# Patient Record
Sex: Male | Born: 1965 | Race: White | Hispanic: No | Smoking: Never smoker
Health system: Southern US, Community
[De-identification: ages and names within clinical notes are randomized; demographics above are authoritative.]

## PROBLEM LIST (undated history)

## (undated) DIAGNOSIS — K219 Gastro-esophageal reflux disease without esophagitis: Secondary | ICD-10-CM

## (undated) DIAGNOSIS — I340 Nonrheumatic mitral (valve) insufficiency: Secondary | ICD-10-CM

## (undated) DIAGNOSIS — M1990 Unspecified osteoarthritis, unspecified site: Secondary | ICD-10-CM

## (undated) DIAGNOSIS — I351 Nonrheumatic aortic (valve) insufficiency: Secondary | ICD-10-CM

## (undated) DIAGNOSIS — F419 Anxiety disorder, unspecified: Secondary | ICD-10-CM

## (undated) DIAGNOSIS — R454 Irritability and anger: Secondary | ICD-10-CM

## (undated) DIAGNOSIS — G709 Myoneural disorder, unspecified: Secondary | ICD-10-CM

## (undated) DIAGNOSIS — F1910 Other psychoactive substance abuse, uncomplicated: Secondary | ICD-10-CM

## (undated) DIAGNOSIS — R011 Cardiac murmur, unspecified: Secondary | ICD-10-CM

## (undated) DIAGNOSIS — I2510 Atherosclerotic heart disease of native coronary artery without angina pectoris: Secondary | ICD-10-CM

## (undated) HISTORY — PX: FINGER SURGERY: SHX640

## (undated) HISTORY — DX: Nonrheumatic aortic (valve) insufficiency: I35.1

## (undated) HISTORY — DX: Nonrheumatic mitral (valve) insufficiency: I34.0

## (undated) HISTORY — PX: KNEE SURGERY: SHX244

## (undated) HISTORY — DX: Gastro-esophageal reflux disease without esophagitis: K21.9

## (undated) HISTORY — PX: JOINT REPLACEMENT: SHX530

## (undated) HISTORY — DX: Myoneural disorder, unspecified: G70.9

## (undated) HISTORY — DX: Other psychoactive substance abuse, uncomplicated: F19.10

---

## 1999-09-25 ENCOUNTER — Encounter: Payer: Self-pay | Admitting: General Surgery

## 1999-09-25 ENCOUNTER — Inpatient Hospital Stay (HOSPITAL_COMMUNITY): Admission: EM | Admit: 1999-09-25 | Discharge: 1999-09-28 | Payer: Self-pay

## 1999-09-26 ENCOUNTER — Encounter: Payer: Self-pay | Admitting: General Surgery

## 1999-10-09 ENCOUNTER — Ambulatory Visit (HOSPITAL_COMMUNITY): Admission: RE | Admit: 1999-10-09 | Discharge: 1999-10-09 | Payer: Self-pay

## 2001-05-18 ENCOUNTER — Emergency Department (HOSPITAL_COMMUNITY): Admission: EM | Admit: 2001-05-18 | Discharge: 2001-05-19 | Payer: Self-pay | Admitting: *Deleted

## 2002-10-11 ENCOUNTER — Encounter: Payer: Self-pay | Admitting: Emergency Medicine

## 2002-10-11 ENCOUNTER — Emergency Department (HOSPITAL_COMMUNITY): Admission: EM | Admit: 2002-10-11 | Discharge: 2002-10-11 | Payer: Self-pay | Admitting: Emergency Medicine

## 2007-03-12 ENCOUNTER — Emergency Department (HOSPITAL_COMMUNITY): Admission: EM | Admit: 2007-03-12 | Discharge: 2007-03-12 | Payer: Self-pay | Admitting: Emergency Medicine

## 2009-05-26 ENCOUNTER — Emergency Department (HOSPITAL_COMMUNITY): Admission: EM | Admit: 2009-05-26 | Discharge: 2009-05-26 | Payer: Self-pay | Admitting: Emergency Medicine

## 2009-09-13 ENCOUNTER — Emergency Department (HOSPITAL_COMMUNITY): Admission: EM | Admit: 2009-09-13 | Discharge: 2009-09-13 | Payer: Self-pay | Admitting: Emergency Medicine

## 2009-09-14 ENCOUNTER — Ambulatory Visit (HOSPITAL_COMMUNITY): Admission: RE | Admit: 2009-09-14 | Discharge: 2009-09-14 | Payer: Self-pay | Admitting: Emergency Medicine

## 2009-11-27 ENCOUNTER — Ambulatory Visit (HOSPITAL_COMMUNITY): Admission: RE | Admit: 2009-11-27 | Discharge: 2009-11-27 | Payer: Self-pay | Admitting: Family Medicine

## 2010-04-16 ENCOUNTER — Emergency Department (HOSPITAL_COMMUNITY): Admission: EM | Admit: 2010-04-16 | Discharge: 2010-04-16 | Payer: Self-pay | Admitting: Emergency Medicine

## 2010-12-02 ENCOUNTER — Encounter: Payer: Self-pay | Admitting: Family Medicine

## 2011-02-13 LAB — BASIC METABOLIC PANEL
BUN: 11 mg/dL (ref 6–23)
GFR calc non Af Amer: >60 mL/min (ref >60)
Potassium: 3.8 mEq/L (ref 3.5–5.1)
Sodium: 137 mEq/L (ref 135–145)

## 2011-02-13 LAB — DIFFERENTIAL
Eosinophils Relative: 2 % (ref 0–5)
Lymphocytes Relative: 33 % (ref 12–46)
Lymphs Abs: 2.2 10*3/uL (ref 0.7–4.0)
Neutro Abs: 4 10*3/uL (ref 1.7–7.7)

## 2011-02-13 LAB — CBC
HCT: 42.1 % (ref 39.0–52.0)
Platelets: 207 10*3/uL (ref 150–400)
WBC: 6.9 10*3/uL (ref 4.0–10.5)

## 2011-02-13 LAB — POCT CARDIAC MARKERS
CKMB, poc: <1 ng/mL — ABNORMAL LOW (ref 1.0–8.0)
Myoglobin, poc: 57.3 ng/mL (ref 12–200)
Troponin i, poc: <0.05 ng/mL (ref 0.00–0.09)

## 2012-12-06 ENCOUNTER — Encounter (HOSPITAL_COMMUNITY): Payer: Self-pay | Admitting: Emergency Medicine

## 2012-12-06 ENCOUNTER — Emergency Department (HOSPITAL_COMMUNITY)
Admission: EM | Admit: 2012-12-06 | Discharge: 2012-12-07 | Disposition: A | Discharge status: Home / Self Care | Payer: Medicaid Other | Attending: Emergency Medicine | Admitting: Emergency Medicine

## 2012-12-06 DIAGNOSIS — R109 Unspecified abdominal pain: Secondary | ICD-10-CM

## 2012-12-06 DIAGNOSIS — R21 Rash and other nonspecific skin eruption: Secondary | ICD-10-CM | POA: Insufficient documentation

## 2012-12-06 DIAGNOSIS — R1032 Left lower quadrant pain: Secondary | ICD-10-CM | POA: Insufficient documentation

## 2012-12-06 NOTE — ED Notes (Signed)
Patient complaining of sharp pain to LLQ. Started approximately 3 hours ago. Denies nausea and diarrhea, no decrease in appetite.

## 2012-12-07 LAB — URINALYSIS, ROUTINE W REFLEX MICROSCOPIC
Ketones, ur: NEGATIVE mg/dL
Leukocytes, UA: NEGATIVE
Nitrite: NEGATIVE
Protein, ur: NEGATIVE mg/dL
Urobilinogen, UA: 0.2 mg/dL (ref 0.0–1.0)
pH: 6.5 (ref 5.0–8.0)

## 2012-12-07 MED ORDER — PERMETHRIN 5 % EX CREA: TOPICAL_CREAM | CUTANEOUS | Status: DC

## 2012-12-07 MED ORDER — ONDANSETRON 8 MG PO TBDP
8.0000 mg | ORAL_TABLET | Freq: Once | ORAL | Status: AC
  Administered 2012-12-07: 8 mg via ORAL
  Filled 2012-12-07: qty 1

## 2012-12-07 MED ORDER — ACETAMINOPHEN 325 MG PO TABS
650.0000 mg | ORAL_TABLET | Freq: Once | ORAL | Status: AC
  Administered 2012-12-07: 650 mg via ORAL
  Filled 2012-12-07: qty 2

## 2012-12-07 NOTE — ED Notes (Signed)
Patient reports history of alcoholism. Reports had quit for several years, then started drinking heavily again. Reports has not drank for 3 weeks and has quit again. Also reports that he was taking a family member's diuretic pills (unsure of name) frequently when he was drinking heavily.

## 2012-12-07 NOTE — ED Notes (Signed)
Pt notes sharp sudden onset LLQ pain and left upper leg/hip pain. States the pain started suddenly 3 hours ago. Also notes some nausea. No other complaints at this time.

## 2012-12-07 NOTE — ED Provider Notes (Signed)
History     CSN: [id number]  Arrival date & time 12/06/12  2334   First MD Initiated Contact with Patient 12/06/12 2355      Chief Complaint  Patient presents with  . Abdominal Pain  . Rash     Patient is a 47 y.o. male presenting with abdominal pain and rash. The history is provided by the patient.  Abdominal Pain The primary symptoms of the illness include abdominal pain. The primary symptoms of the illness do not include fever, shortness of breath, nausea, vomiting, diarrhea, hematochezia or dysuria. The current episode started 3 to 5 hours ago. The onset of the illness was sudden. The problem has not changed since onset. The patient has not had a change in bowel habit. Symptoms associated with the illness do not include diaphoresis, constipation, frequency or back pain.  Rash  This is a recurrent problem. The current episode started more than 1 week ago. The problem has not changed since onset.The problem is associated with an unknown factor.  pt reports he had LLQ pain that started 3 hrs ago.  Never had this before.  No trauma.  No fever/vomiting.  No back pain.  No groin pain.  No weakness reported  He also reports rash to his legs for several weeks.  He reports it is itching and it is worse at night. No sick contacts No h/o allergic reactions  He reports quitting etoh about 3 weeks ago.  No other acute issues at this time  PMH - none  Past Surgical History  Procedure Date  . Finger surgery     History reviewed. No pertinent family history.  History  Substance Use Topics  . Smoking status: Never Smoker   . Smokeless tobacco: Not on file  . Alcohol Use: No     Comment: quit 3 weeks ago per patient, reports used to drink heavily.      Review of Systems  Constitutional: Negative for fever and diaphoresis.  Respiratory: Negative for shortness of breath.   Gastrointestinal: Positive for abdominal pain. Negative for nausea, vomiting, diarrhea, constipation and  hematochezia.  Genitourinary: Negative for dysuria, frequency and testicular pain.  Musculoskeletal: Negative for back pain.  Skin: Positive for rash.  All other systems reviewed and are negative.    Allergies  Review of patient's allergies indicates no known allergies.  Home Medications  No current outpatient prescriptions on file.  BP 132/85  Pulse 90  Temp 97.8 F (36.6 C) (Oral)  Resp 20  Ht 5\' 6"  (1.676 m)  Wt 210 lb (95.255 kg)  BMI 33.89 kg/m2  SpO2 96%  Physical Exam CONSTITUTIONAL: Well developed/well nourished, well appearing, watching TV HEAD AND FACE: Normocephalic/atraumatic EYES: EOMI/PERRL ENMT: Mucous membranes moist NECK: supple no meningeal signs SPINE:entire spine nontender CV: S1/S2 noted, no murmurs/rubs/gallops noted LUNGS: Lungs are clear to auscultation bilaterally, no apparent distress ABDOMEN: soft, mild tenderness to left lower quadrant, no rebound or guarding, +BS GU:no cva tenderness. No inguinal tenderness/hernia noted NEURO: Pt is awake/alert, moves all extremitiesx44m, no focal weakness noted EXTREMITIES: pulses normal, full ROM SKIN: warm, color normal. Scattered Erythematous rash noted to bilateral LE.  No urticaria.  No petechiae.   PSYCH: no abnormalities of mood noted  ED Course  Procedures    Labs Reviewed  URINALYSIS, ROUTINE W REFLEX MICROSCOPIC   Pt reports onset of LLQ pain earlier tonight.  He is well appearing and in no distress.  He is point tender, but no hernia noted.  He has  no other associated symptoms.  Will check u/a and reassess  For his rash, will treat for possible scabies.    1:10 AM Pt reading a magazine, watching TV.  Very well appearing.  I doubt acute abdominal process.  He is in no distress.  He is improved and his abdomen is soft without focal tenderness.  We discussed strict return precautions.  Pt agreeable to plan   MDM  Nursing notes including past medical history and social history reviewed and  considered in documentation Labs/vital reviewed and considered         Joya Gaskins, MD 12/07/12 0111

## 2014-10-30 ENCOUNTER — Encounter (HOSPITAL_COMMUNITY): Payer: Self-pay | Admitting: *Deleted

## 2014-10-30 ENCOUNTER — Emergency Department (HOSPITAL_COMMUNITY): Payer: Medicaid Other

## 2014-10-30 ENCOUNTER — Emergency Department (HOSPITAL_COMMUNITY)
Admission: EM | Admit: 2014-10-30 | Discharge: 2014-10-30 | Disposition: A | Discharge status: Home / Self Care | Payer: Medicaid Other | Attending: Emergency Medicine | Admitting: Emergency Medicine

## 2014-10-30 DIAGNOSIS — M79602 Pain in left arm: Secondary | ICD-10-CM | POA: Insufficient documentation

## 2014-10-30 DIAGNOSIS — R079 Chest pain, unspecified: Secondary | ICD-10-CM | POA: Insufficient documentation

## 2014-10-30 DIAGNOSIS — Z79899 Other long term (current) drug therapy: Secondary | ICD-10-CM | POA: Insufficient documentation

## 2014-10-30 DIAGNOSIS — M62838 Other muscle spasm: Secondary | ICD-10-CM | POA: Insufficient documentation

## 2014-10-30 DIAGNOSIS — M542 Cervicalgia: Secondary | ICD-10-CM | POA: Insufficient documentation

## 2014-10-30 LAB — CBC WITH DIFFERENTIAL/PLATELET
Basophils Absolute: 0 10*3/uL (ref 0.0–0.1)
Basophils Relative: 1 % (ref 0–1)
EOS PCT: 3 % (ref 0–5)
Eosinophils Absolute: 0.1 10*3/uL (ref 0.0–0.7)
HEMATOCRIT: 39.3 % (ref 39.0–52.0)
HEMOGLOBIN: 13.6 g/dL (ref 13.0–17.0)
LYMPHS ABS: 2 10*3/uL (ref 0.7–4.0)
LYMPHS PCT: 36 % (ref 12–46)
MCH: 30.2 pg (ref 26.0–34.0)
MCHC: 34.6 g/dL (ref 30.0–36.0)
MCV: 87.3 fL (ref 78.0–100.0)
MONO ABS: 0.5 10*3/uL (ref 0.1–1.0)
MONOS PCT: 9 % (ref 3–12)
Neutro Abs: 2.9 10*3/uL (ref 1.7–7.7)
Neutrophils Relative %: 53 % (ref 43–77)
Platelets: 223 10*3/uL (ref 150–400)
RBC: 4.5 MIL/uL (ref 4.22–5.81)
RDW: 12.3 % (ref 11.5–15.5)
WBC: 5.5 10*3/uL (ref 4.0–10.5)

## 2014-10-30 LAB — COMPREHENSIVE METABOLIC PANEL
ALT: 19 U/L (ref 0–53)
ANION GAP: 14 (ref 5–15)
AST: 17 U/L (ref 0–37)
Albumin: 3.6 g/dL (ref 3.5–5.2)
Alkaline Phosphatase: 68 U/L (ref 39–117)
BUN: 11 mg/dL (ref 6–23)
CALCIUM: 8.9 mg/dL (ref 8.4–10.5)
CO2: 22 meq/L (ref 19–32)
CREATININE: 0.74 mg/dL (ref 0.50–1.35)
Chloride: 103 mEq/L (ref 96–112)
GLUCOSE: 109 mg/dL — AB (ref 70–99)
Potassium: 3.7 mEq/L (ref 3.7–5.3)
SODIUM: 139 meq/L (ref 137–147)
Total Bilirubin: 0.2 mg/dL — ABNORMAL LOW (ref 0.3–1.2)
Total Protein: 6.6 g/dL (ref 6.0–8.3)

## 2014-10-30 LAB — TROPONIN I: Troponin I: <0.3 ng/mL (ref <0.30)

## 2014-10-30 MED ORDER — IBUPROFEN 800 MG PO TABS
800.0000 mg | ORAL_TABLET | Freq: Once | ORAL | Status: AC
  Administered 2014-10-30: 800 mg via ORAL
  Filled 2014-10-30: qty 1

## 2014-10-30 MED ORDER — IBUPROFEN 800 MG PO TABS: 800.0000 mg | ORAL_TABLET | Freq: Three times a day (TID) | ORAL | Status: DC | PRN

## 2014-10-30 MED ORDER — OXYCODONE-ACETAMINOPHEN 5-325 MG PO TABS: 1.0000 | ORAL_TABLET | ORAL | Status: DC | PRN

## 2014-10-30 MED ORDER — DIAZEPAM 5 MG PO TABS
5.0000 mg | ORAL_TABLET | Freq: Once | ORAL | Status: AC
  Administered 2014-10-30: 5 mg via ORAL
  Filled 2014-10-30: qty 1

## 2014-10-30 MED ORDER — DIAZEPAM 5 MG PO TABS: 5.0000 mg | ORAL_TABLET | Freq: Three times a day (TID) | ORAL | Status: DC | PRN

## 2014-10-30 MED ORDER — OXYCODONE-ACETAMINOPHEN 5-325 MG PO TABS
1.0000 | ORAL_TABLET | Freq: Once | ORAL | Status: AC
  Administered 2014-10-30: 1 via ORAL
  Filled 2014-10-30: qty 1

## 2014-10-30 NOTE — ED Notes (Signed)
Pt verbalized understanding of no driving and to use caution within 4 hours of taking pain meds due to meds cause drowsiness 

## 2014-10-30 NOTE — ED Provider Notes (Signed)
This chart was scribed for Layla MawKristen N Kess Mcilwain, DO by Roxy Cedarhandni Bhalodia, ED Scribe. This patient was seen in room APA11/[facility] and the patient's care was started at 6:20 PM.  TIME SEEN: 6:35 PM   CHIEF COMPLAINT: Arm Pain  HPI: Patient is a 48 y.o. male with no known chronic medical conditions, who presents to the ED today complaining of posterior neck and bilateral shoulder pain. Patient states that he came home from work yesterday at 5pm and had bilateral shoulder pain, works as a Chartered certified accountantmachinist. He also reports associated swelling of hands and chest pain. Chest pain is intermittent and lasts for 1-2 seconds. He describes his chest pain as a dull pain with intermittent sharp spasms. He denies associated SOB, nausea, vomiting, diaphoresis, dizziness. He states that the pain worsens with movement and improves with ibuprofen and aleve. Patient is a non smoker. He has a family history of hypertension, diabetes, HLD. No h/o PE or DVT.  No fever, cough.  No numbness, weakness. He states that he fell out of a truck and hurt his neck about 1 month ago. He was not seen by the doctor for this concern.  ROS: See HPI Constitutional: no fever  Eyes: no drainage  ENT: no runny nose   Cardiovascular:  Intermittent chest pain. Resp: no SOB  GI: no vomiting GU: no dysuria Integumentary: no rash  Allergy: no hives  Musculoskeletal: no leg swelling; Bilateral Shoulder Pain; Left Arm Pain Neurological: no slurred speech; Tingling in left arm ROS otherwise negative  PAST MEDICAL HISTORY/PAST SURGICAL HISTORY:  History reviewed. No pertinent past medical history.  MEDICATIONS:  Prior to Admission medications   Medication Sig Start Date End Date Taking? Authorizing Provider  permethrin (ELIMITE) 5 % cream Apply to affected area once 12/07/12   Joya Gaskinsonald W Wickline, MD   ALLERGIES:  No Known Allergies  SOCIAL HISTORY:  History  Substance Use Topics  . Smoking status: Never Smoker   . Smokeless tobacco: Not on file   . Alcohol Use: No    FAMILY HISTORY: No family history on file.  EXAM:  Triage Vitals: BP 125/82 mmHg  Pulse 80  Temp(Src) 97.4 F (36.3 C) (Oral)  Resp 20  Ht 5\' 6"  (1.676 m)  Wt 190 lb (86.183 kg)  BMI 30.68 kg/m2  SpO2 98%  CONSTITUTIONAL: Alert and oriented and responds appropriately to questions. Well-appearing; well-nourished HEAD: Normocephalic EYES: Conjunctivae clear, PERRL ENT: normal nose; no rhinorrhea; moist mucous membranes; pharynx without lesions noted NECK: Supple, no meningismus, no LAD; TTP over bilateral trapezius muscles, no midline stepoff or deformity CARD: RRR; S1 and S2 appreciated; no murmurs, no clicks, no rubs, no gallops RESP: Normal chest excursion without splinting or tachypnea; breath sounds clear and equal bilaterally; no wheezes, no rhonchi, no rales, no hypoxia ABD/GI: Normal bowel sounds; non-distended; soft, non-tender, no rebound, no guarding BACK:  The back appears normal and is non-tender to palpation, there is no CVA tenderness; no midline stepoff or deformity or TTP EXT: Normal ROM in all joints; non-tender to palpation; no edema; normal capillary refill; no cyanosis    SKIN: Normal color for age and race; warm. 2+ radial pulses bilaterally, compartments soft, no bony tenderness NEURO: Moves all extremities equally; strength 5/5 in all extremities, sensation to light touch intact diffusely, normal gait, CN 2-12 intact; 2+ DTR in bilateral UE and LE PSYCH: The patient's mood and manner are appropriate. Grooming and personal hygiene are appropriate.  MEDICAL DECISION MAKING: Pt here with trap muscle spasm.  Recent neck injury but neuro intact and negative CT cervical spine.  Very atypical CP, none now.  EKG nonischemic, troponin negative, no risk factors for PE or DVT, Cxr clear.  Improved with percocet, valium, ibuprofen.  Pain reproducible with palpation bilateral trap muscles.  Will dc home with pain meds, muscle relaxers, return  precautions.     EKG Interpretation  Date/Time:  Sunday October 30 2014 18:30:26 EST Ventricular Rate:  72 PR Interval:  182 QRS Duration: 100 QT Interval:  384 QTC Calculation: 420 R Axis:   33 Text Interpretation:  Normal sinus rhythm Normal ECG Confirmed by Oren Barella,  DO, Athalee Esterline ([id number]) on 10/30/2014 6:45:07 PM       I personally performed the services described in this documentation, which was scribed in my presence. The recorded information has been reviewed and is accurate.  Layla MawKristen N Joshia Kitchings, DO 10/31/14 2245

## 2014-10-30 NOTE — Discharge Instructions (Signed)
Cervical Sprain A cervical sprain is an injury in the neck in which the strong, fibrous tissues (ligaments) that connect your neck bones stretch or tear. Cervical sprains can range from mild to severe. Severe cervical sprains can cause the neck vertebrae to be unstable. This can lead to damage of the spinal cord and can result in serious nervous system problems. The amount of time it takes for a cervical sprain to get better depends on the cause and extent of the injury. Most cervical sprains heal in 1 to 3 weeks. CAUSES  Severe cervical sprains may be caused by:   Contact sport injuries (such as from football, rugby, wrestling, hockey, auto racing, gymnastics, diving, martial arts, or boxing).   Motor vehicle collisions.   Whiplash injuries. This is an injury from a sudden forward and backward whipping movement of the head and neck.  Falls.  Mild cervical sprains may be caused by:   Being in an awkward position, such as while cradling a telephone between your ear and shoulder.   Sitting in a chair that does not offer proper support.   Working at a poorly Landscape architect station.   Looking up or down for long periods of time.  SYMPTOMS   Pain, soreness, stiffness, or a burning sensation in the front, back, or sides of the neck. This discomfort may develop immediately after the injury or slowly, 24 hours or more after the injury.   Pain or tenderness directly in the middle of the back of the neck.   Shoulder or upper back pain.   Limited ability to move the neck.   Headache.   Dizziness.   Weakness, numbness, or tingling in the hands or arms.   Muscle spasms.   Difficulty swallowing or chewing.   Tenderness and swelling of the neck.  DIAGNOSIS  Most of the time your health care provider can diagnose a cervical sprain by taking your history and doing a physical exam. Your health care provider will ask about previous neck injuries and any known neck  problems, such as arthritis in the neck. X-rays may be taken to find out if there are any other problems, such as with the bones of the neck. Other tests, such as a CT scan or MRI, may also be needed.  TREATMENT  Treatment depends on the severity of the cervical sprain. Mild sprains can be treated with rest, keeping the neck in place (immobilization), and pain medicines. Severe cervical sprains are immediately immobilized. Further treatment is done to help with pain, muscle spasms, and other symptoms and may include:  Medicines, such as pain relievers, numbing medicines, or muscle relaxants.   Physical therapy. This may involve stretching exercises, strengthening exercises, and posture training. Exercises and improved posture can help stabilize the neck, strengthen muscles, and help stop symptoms from returning.  HOME CARE INSTRUCTIONS   Put ice on the injured area.   Put ice in a plastic bag.   Place a towel between your skin and the bag.   Leave the ice on for 15-20 minutes, 3-4 times a day.   If your injury was severe, you may have been given a cervical collar to wear. A cervical collar is a two-piece collar designed to keep your neck from moving while it heals.  Do not remove the collar unless instructed by your health care provider.  If you have long hair, keep it outside of the collar.  Ask your health care provider before making any adjustments to your collar. Minor  adjustments may be required over time to improve comfort and reduce pressure on your chin or on the back of your head.  Ifyou are allowed to remove the collar for cleaning or bathing, follow your health care provider's instructions on how to do so safely.  Keep your collar clean by wiping it with mild soap and water and drying it completely. If the collar you have been given includes removable pads, remove them every 1-2 days and hand wash them with soap and water. Allow them to air dry. They should be completely  dry before you wear them in the collar.  If you are allowed to remove the collar for cleaning and bathing, wash and dry the skin of your neck. Check your skin for irritation or sores. If you see any, tell your health care provider.  Do not drive while wearing the collar.   Only take over-the-counter or prescription medicines for pain, discomfort, or fever as directed by your health care provider.   Keep all follow-up appointments as directed by your health care provider.   Keep all physical therapy appointments as directed by your health care provider.   Make any needed adjustments to your workstation to promote good posture.   Avoid positions and activities that make your symptoms worse.   Warm up and stretch before being active to help prevent problems.  SEEK MEDICAL CARE IF:   Your pain is not controlled with medicine.   You are unable to decrease your pain medicine over time as planned.   Your activity level is not improving as expected.  SEEK IMMEDIATE MEDICAL CARE IF:   You develop any bleeding.  You develop stomach upset.  You have signs of an allergic reaction to your medicine.   Your symptoms get worse.   You develop new, unexplained symptoms.   You have numbness, tingling, weakness, or paralysis in any part of your body.  MAKE SURE YOU:   Understand these instructions.  Will watch your condition.  Will get help right away if you are not doing well or get worse. Document Released: 08/25/2007 Document Revised: 11/02/2013 Document Reviewed: 05/05/2013 [facility] Patient Information 2015 [place], [place]. This information is not intended to replace advice given to you by your health care provider. Make sure you discuss any questions you have with your health care provider.  Chest Pain (Nonspecific) It is often hard to give a specific diagnosis for the cause of chest pain. There is always a chance that your pain could be related to something serious,  such as a heart attack or a blood clot in the lungs. You need to follow up with your health care provider for further evaluation. CAUSES   Heartburn.  Pneumonia or bronchitis.  Anxiety or stress.  Inflammation around your heart (pericarditis) or lung (pleuritis or pleurisy).  A blood clot in the lung.  A collapsed lung (pneumothorax). It can develop suddenly on its own (spontaneous pneumothorax) or from trauma to the chest.  Shingles infection (herpes zoster virus). The chest wall is composed of bones, muscles, and cartilage. Any of these can be the source of the pain.  The bones can be bruised by injury.  The muscles or cartilage can be strained by coughing or overwork.  The cartilage can be affected by inflammation and become sore (costochondritis). DIAGNOSIS  Lab tests or other studies may be needed to find the cause of your pain. Your health care provider may have you take a test called an ambulatory electrocardiogram (ECG). An  ECG records your heartbeat patterns over a 24-hour period. You may also have other tests, such as:  Transthoracic echocardiogram (TTE). During echocardiography, sound waves are used to evaluate how blood flows through your heart.  Transesophageal echocardiogram (TEE).  Cardiac monitoring. This allows your health care provider to monitor your heart rate and rhythm in real time.  Holter monitor. This is a portable device that records your heartbeat and can help diagnose heart arrhythmias. It allows your health care provider to track your heart activity for several days, if needed.  Stress tests by exercise or by giving medicine that makes the heart beat faster. TREATMENT   Treatment depends on what may be causing your chest pain. Treatment may include:  Acid blockers for heartburn.  Anti-inflammatory medicine.  Pain medicine for inflammatory conditions.  Antibiotics if an infection is present.  You may be advised to change lifestyle habits.  This includes stopping smoking and avoiding alcohol, caffeine, and chocolate.  You may be advised to keep your head raised (elevated) when sleeping. This reduces the chance of acid going backward from your stomach into your esophagus. Most of the time, nonspecific chest pain will improve within 2-3 days with rest and mild pain medicine.  HOME CARE INSTRUCTIONS   If antibiotics were prescribed, take them as directed. Finish them even if you start to feel better.  For the next few days, avoid physical activities that bring on chest pain. Continue physical activities as directed.  Do not use any tobacco products, including cigarettes, chewing tobacco, or electronic cigarettes.  Avoid drinking alcohol.  Only take medicine as directed by your health care provider.  Follow your health care provider's suggestions for further testing if your chest pain does not go away.  Keep any follow-up appointments you made. If you do not go to an appointment, you could develop lasting (chronic) problems with pain. If there is any problem keeping an appointment, call to reschedule. SEEK MEDICAL CARE IF:   Your chest pain does not go away, even after treatment.  You have a rash with blisters on your chest.  You have a fever. SEEK IMMEDIATE MEDICAL CARE IF:   You have increased chest pain or pain that spreads to your arm, neck, jaw, back, or abdomen.  You have shortness of breath.  You have an increasing cough, or you cough up blood.  You have severe back or abdominal pain.  You feel nauseous or vomit.  You have severe weakness.  You faint.  You have chills. This is an emergency. Do not wait to see if the pain will go away. Get medical help at once. Call your local emergency services (911 in [place] Do not drive yourself to the hospital. MAKE SURE YOU:   Understand these instructions.  Will watch your condition.  Will get help right away if you are not doing well or get worse. Document  Released: 08/07/2005 Document Revised: 11/02/2013 Document Reviewed: 06/02/2008 [facility] Patient Information 2015 [place], [place]. This information is not intended to replace advice given to you by your health care provider. Make sure you discuss any questions you have with your health care provider.  Muscle Cramps and Spasms Muscle cramps and spasms occur when a muscle or muscles tighten and you have no control over this tightening (involuntary muscle contraction). They are a common problem and can develop in any muscle. The most common place is in the calf muscles of the leg. Both muscle cramps and muscle spasms are involuntary muscle contractions, but they  also have differences:   Muscle cramps are sporadic and painful. They may last a few seconds to a quarter of an hour. Muscle cramps are often more forceful and last longer than muscle spasms.  Muscle spasms may or may not be painful. They may also last just a few seconds or much longer. CAUSES  It is uncommon for cramps or spasms to be due to a serious underlying problem. In many cases, the cause of cramps or spasms is unknown. Some common causes are:   Overexertion.   Overuse from repetitive motions (doing the same thing over and over).   Remaining in a certain position for a long period of time.   Improper preparation, form, or technique while performing a sport or activity.   Dehydration.   Injury.   Side effects of some medicines.   Abnormally low levels of the salts and ions in your blood (electrolytes), especially potassium and calcium. This could happen if you are taking water pills (diuretics) or you are pregnant.  Some underlying medical problems can make it more likely to develop cramps or spasms. These include, but are not limited to:   Diabetes.   Parkinson disease.   Hormone disorders, such as thyroid problems.   Alcohol abuse.   Diseases specific to muscles, joints, and bones.   Blood vessel  disease where not enough blood is getting to the muscles.  HOME CARE INSTRUCTIONS   Stay well hydrated. Drink enough water and fluids to keep your urine clear or pale yellow.  It may be helpful to massage, stretch, and relax the affected muscle.  For tight or tense muscles, use a warm towel, heating pad, or hot shower water directed to the affected area.  If you are sore or have pain after a cramp or spasm, applying ice to the affected area may relieve discomfort.  Put ice in a plastic bag.  Place a towel between your skin and the bag.  Leave the ice on for 15-20 minutes, 03-04 times a day.  Medicines used to treat a known cause of cramps or spasms may help reduce their frequency or severity. Only take over-the-counter or prescription medicines as directed by your caregiver. SEEK MEDICAL CARE IF:  Your cramps or spasms get more severe, more frequent, or do not improve over time.  MAKE SURE YOU:   Understand these instructions.  Will watch your condition.  Will get help right away if you are not doing well or get worse. Document Released: 04/19/2002 Document Revised: 02/22/2013 Document Reviewed: 10/14/2012 [facility]quezExitCare Patient Information 2015 [place], [place]. This information is not intended to replace advice given to you by your health care provider. Make sure you discuss any questions you have with your health care provider.

## 2014-10-30 NOTE — ED Notes (Addendum)
Pt states neck pain, bilateral shoulder pain, left arm pain and intermittent pain to chest. Symptoms began at 1900 last night. Also states swelling to hands last night and this morning.

## 2015-07-28 ENCOUNTER — Encounter (HOSPITAL_COMMUNITY): Payer: Self-pay

## 2015-07-28 ENCOUNTER — Emergency Department (HOSPITAL_COMMUNITY)
Admission: EM | Admit: 2015-07-28 | Discharge: 2015-07-30 | Disposition: A | Discharge status: Other Acute Inpatient Hospital | Payer: Medicaid Other | Attending: Emergency Medicine | Admitting: Emergency Medicine

## 2015-07-28 DIAGNOSIS — F419 Anxiety disorder, unspecified: Secondary | ICD-10-CM | POA: Insufficient documentation

## 2015-07-28 DIAGNOSIS — R45850 Homicidal ideations: Secondary | ICD-10-CM

## 2015-07-28 DIAGNOSIS — Z79899 Other long term (current) drug therapy: Secondary | ICD-10-CM | POA: Insufficient documentation

## 2015-07-28 HISTORY — DX: Irritability and anger: R45.4

## 2015-07-28 LAB — COMPREHENSIVE METABOLIC PANEL
ALBUMIN: 4.3 g/dL (ref 3.5–5.0)
ALT: 18 U/L (ref 17–63)
AST: 23 U/L (ref 15–41)
Alkaline Phosphatase: 60 U/L (ref 38–126)
Anion gap: 8 (ref 5–15)
BILIRUBIN TOTAL: 1 mg/dL (ref 0.3–1.2)
BUN: 12 mg/dL (ref 6–20)
CHLORIDE: 105 mmol/L (ref 101–111)
CO2: 25 mmol/L (ref 22–32)
CREATININE: 0.76 mg/dL (ref 0.61–1.24)
Calcium: 8.6 mg/dL — ABNORMAL LOW (ref 8.9–10.3)
GFR calc Af Amer: >60 mL/min (ref >60)
GFR calc non Af Amer: >60 mL/min (ref >60)
GLUCOSE: 99 mg/dL (ref 65–99)
POTASSIUM: 3.6 mmol/L (ref 3.5–5.1)
Sodium: 138 mmol/L (ref 135–145)
Total Protein: 7.2 g/dL (ref 6.5–8.1)

## 2015-07-28 LAB — CBC WITH DIFFERENTIAL/PLATELET
Basophils Absolute: 0 10*3/uL (ref 0.0–0.1)
Basophils Relative: 1 %
Eosinophils Absolute: 0.1 10*3/uL (ref 0.0–0.7)
Eosinophils Relative: 1 %
HEMATOCRIT: 41.3 % (ref 39.0–52.0)
Hemoglobin: 14.2 g/dL (ref 13.0–17.0)
LYMPHS PCT: 25 %
Lymphs Abs: 1.6 10*3/uL (ref 0.7–4.0)
MCH: 30.1 pg (ref 26.0–34.0)
MCHC: 34.4 g/dL (ref 30.0–36.0)
MCV: 87.7 fL (ref 78.0–100.0)
MONO ABS: 0.7 10*3/uL (ref 0.1–1.0)
MONOS PCT: 10 %
NEUTROS ABS: 4 10*3/uL (ref 1.7–7.7)
Neutrophils Relative %: 63 %
Platelets: 243 10*3/uL (ref 150–400)
RBC: 4.71 MIL/uL (ref 4.22–5.81)
RDW: 12.3 % (ref 11.5–15.5)
WBC: 6.4 10*3/uL (ref 4.0–10.5)

## 2015-07-28 LAB — RAPID URINE DRUG SCREEN, HOSP PERFORMED
Amphetamines: NOT DETECTED
BARBITURATES: NOT DETECTED
BENZODIAZEPINES: NOT DETECTED
Cocaine: NOT DETECTED
Opiates: NOT DETECTED
Tetrahydrocannabinol: NOT DETECTED

## 2015-07-28 LAB — ETHANOL: Alcohol, Ethyl (B): <5 mg/dL (ref <5)

## 2015-07-28 MED ORDER — TOPIRAMATE 100 MG PO TABS
100.0000 mg | ORAL_TABLET | Freq: Every day | ORAL | Status: DC
  Administered 2015-07-28 – 2015-07-29 (×2): 100 mg via ORAL
  Filled 2015-07-28 (×3): qty 1

## 2015-07-28 MED ORDER — DIVALPROEX SODIUM ER 500 MG PO TB24
500.0000 mg | ORAL_TABLET | Freq: Every day | ORAL | Status: DC
  Administered 2015-07-28 – 2015-07-30 (×3): 500 mg via ORAL
  Filled 2015-07-28 (×3): qty 1

## 2015-07-28 NOTE — BH Assessment (Signed)
[facility] Assessment Progress Note     Called and scheduled pt's tele assessment with this clinician as well as gathered clinical information from Burgess Amor, PA-C.  Casimer Lanius, MS, [facility] Therapeutic Triage Specialist [facility]

## 2015-07-28 NOTE — ED Notes (Signed)
Lab at bedside
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
## 2015-07-28 NOTE — ED Notes (Signed)
Pt wanded by security and belongings placed in locker room.
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
## 2015-07-28 NOTE — BH Assessment (Addendum)
Tele Assessment Note   Alex Wilson is an 49 y.o. male that presents to [facility], self-referred, asking for help with his HI, anger, and Bipolar Disorder.  Pt reports he has had HI toward "specific people" for some time.  Pt would not identify these people, or state why he was angry with them, but stated, "thank God I haven't run across them."  Pt stated he had "numerous ways" he could hurt these people.  Pt stated he hears voices, talks to the voice, gets angry, blacks out, and has been verbally aggressive to friends and family, broken chairs and computers in the home and not remembering doing so by report.  Pt denies hx of physical violence toward others.  Pt denies SI or any self-harm in past or present.  Pt reports he drinks alcohol daily, 12 pk of beer per day, last drank 2 glasses of wine last night.  Pt doesn't believe he has a drinking problem by report.  Pt denies any current withdrawal sx.  Pt stated he has recent hx of use of cocaine and meth, but has not used either substance in one month.  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  Pt stated he has been diagnosed with Bipolar Disorder by [facility] in the past and his been hospitalized for HI at [facility] and [place] several times in past.  Pt has had outpatient treatment with [facility] for med mgnt.  Pt stated he is prescribed Depakote, Topamax, and Buspar, but does not take his medications as prescribed.  He reports depressed mood, rapid mood swings, poor sleep, weight loss.  Pt reports current stressors include losing a job, getting separated from his fiance, and having to move back in with his mother.  Pt was cooperative, appeared anxious, with depressed mood, good eye contact, rapid, pressured speech, was oriented x 4, had logical/coherent thought processes, in scrubs.  Consulted with Assunta Found, NP who recommends inpatient psychiatric hosptitalization.  Updated PA Idol who was in agreement with pt  disposition.  Pt motivated for treatment and wants help.  TTS to seek placement for the pt.  Updated ED and TTS.  Axis I: 296.44 Bipolar I disorder, Current or most recent episode manic, With psychotic features, 303.90 Alcohol use disorder, Severe Axis II: Deferred Axis III:  Past Medical History  Diagnosis Date  . Difficulty controlling anger    Axis IV: economic problems, housing problems, occupational problems, other psychosocial or environmental problems, problems related to social environment, problems with access to health care services and problems with primary support group Axis V: 21-30 behavior considerably influenced by delusions or hallucinations OR serious impairment in judgment, communication OR inability to function in almost all areas  Past Medical History:  Past Medical History  Diagnosis Date  . Difficulty controlling anger     Past Surgical History  Procedure Laterality Date  . Finger surgery      Family History: No family history on file.  Social History:  reports that he has never smoked. He does not have any smokeless tobacco history on file. He reports that he drinks alcohol. He reports that he uses illicit drugs (Cocaine).  Additional Social History:  Alcohol / Drug Use Pain Medications: none Prescriptions: see med list Over the Counter: see med list History of alcohol / drug use?: Yes Longest period of sobriety (when/how long): 2 years Negative Consequences of Use: Personal relationships Withdrawal Symptoms:  (pt denies) Substance #1 Name of  Substance 1: Alcohol 1 - Age of First Use: 9 1 - Amount (size/oz): 12 pk beer 1 - Frequency: daily 1 - Duration: 2 years-relapsed June 2016 1 - Last Use / Amount: last night-2 glasses of wine Substance #2 Name of Substance 2: Cocaine 2 - Age of First Use: unk 2 - Amount (size/oz): unk 2 - Frequency: unk 2 - Duration: unk 2 - Last Use / Amount: pt denies use in over 1 month Substance #3 Name of Substance  3: Crustal Meth 3 - Age of First Use: unk 3 - Amount (size/oz): unk 3 - Frequency: unk 3 - Duration: unk 3 - Last Use / Amount: pt denie use in over 1 month  CIWA: CIWA-Ar BP: 140/83 mmHg Pulse Rate: 78 Nausea and Vomiting: no nausea and no vomiting Tactile Disturbances: none Tremor: no tremor Auditory Disturbances: not present Paroxysmal Sweats: no sweat visible Visual Disturbances: not present Anxiety: mildly anxious Headache, Fullness in Head: none present Agitation: normal activity Orientation and Clouding of Sensorium: oriented and can do serial additions CIWA-Ar Total: 1 COWS:    PATIENT STRENGTHS: (choose at least two) Average or above average intelligence Motivation for treatment/growth  Allergies: No Known Allergies  Home Medications:  (Not in a hospital admission)  OB/GYN Status:  No LMP for male patient.  General Assessment Data Location of Assessment: [facility] TTS Assessment: In system Is this a Tele or Face-to-Face Assessment?: Tele Assessment Is this an Initial Assessment or a Re-assessment for this encounter?: Initial Assessment Marital status: Divorced Dazey name: na Is patient pregnant?: Other (Comment) (na) Pregnancy Status:  (na) Living Arrangements: Other relatives Can pt return to current living arrangement?: Yes Admission Status: Voluntary Is patient capable of signing voluntary admission?: Yes Referral Source: Self/Family/Friend Insurance type:  (None)  Medical Screening Exam [facility] Walk-in ONLY) Medical Exam completed:  (na)  Crisis Care Plan Living Arrangements: Other relatives Name of Psychiatrist: none Name of Therapist: none  Education Status Is patient currently in school?: No Current Grade:  (na) Highest grade of school patient has completed:  (community college degree) Name of school:  (na) Contact person: na  Risk to self with the past 6 months Suicidal Ideation: No Has patient been a risk to self within the past 6 months  prior to admission? : No Suicidal Intent: No Has patient had any suicidal intent within the past 6 months prior to admission? : No Is patient at risk for suicide?: No Suicidal Plan?: No Has patient had any suicidal plan within the past 6 months prior to admission? : No Access to Means: No What has been your use of drugs/alcohol within the last 12 months?:  (pt reports daily use of alcohol, recent hx of meth and cocai) Previous Attempts/Gestures: No How many times?:  (na) Other Self Harm Risks:  (pt denies) Triggers for Past Attempts: None known (na-pt denies) Intentional Self Injurious Behavior: None Family Suicide History: No Recent stressful life event(s): Loss (Comment), Job Loss, Financial Problems, Other (Comment) (HI, separation from fiance, hallucinations, financial) Persecutory voices/beliefs?: Yes Depression: Yes Depression Symptoms: Despondent, Insomnia, Tearfulness, Guilt, Loss of interest in usual pleasures, Feeling worthless/self pity, Feeling angry/irritable Substance abuse history and/or treatment for substance abuse?: Yes Suicide prevention information given to non-admitted patients: Not applicable  Risk to Others within the past 6 months Homicidal Ideation: Yes-Currently Present Does patient have any lifetime risk of violence toward others beyond the six months prior to admission? : Yes (comment) Thoughts of Harm to Others: Yes-Currently Present Comment -  Thoughts of Harm to Others:  (pt will not elaborate, states he has numerous ways to hurt ) Current Homicidal Intent: Yes-Currently Present Current Homicidal Plan: Yes-Currently Present Describe Current Homicidal Plan: states has numerous plans, will not elaborate Access to Homicidal Means:  (pt denies) Identified Victim:  ("specific people" - pt would not elaborate) History of harm to others?:  (pt reports he has been verbally abusive to others in past) Assessment of Violence: On admission Violent Behavior  Description:  (pt reports verbally aggressive to others, destroys property) Does patient have access to weapons?:  (pt denies) Criminal Charges Pending?: No Does patient have a court date: No Is patient on probation?: No  Psychosis Hallucinations: Auditory (pt reports he hears voices and talks to them) Delusions: None noted  Mental Status Report Appearance/Hygiene: Unremarkable, In scrubs Eye Contact: Good Motor Activity: Freedom of movement, Unremarkable Speech: Logical/coherent, Rapid, Pressured Level of Consciousness: Alert Mood: Depressed, Anxious Affect: Anxious, Depressed Anxiety Level: Moderate Thought Processes: Coherent, Relevant Judgement: Impaired Orientation: Person, Place, Time, Situation Obsessive Compulsive Thoughts/Behaviors: None  Cognitive Functioning Concentration: Decreased Memory: Recent Intact, Remote Intact IQ: Average Insight: Fair Impulse Control: Poor Appetite: Poor Weight Loss:  (> 10 lbs) Weight Gain: 0 Sleep: Decreased Total Hours of Sleep:  (4-5) Vegetative Symptoms: None  ADLScreening [facility] Assessment Services) Patient's cognitive ability adequate to safely complete daily activities?: Yes Patient able to express need for assistance with ADLs?: Yes Independently performs ADLs?: Yes (appropriate for developmental age)  Prior Inpatient Therapy Prior Inpatient Therapy: Yes Prior Therapy Dates:  (several unknown dates in past) Prior Therapy Facilty/Provider(s): [facility], [place] Reason for Treatment: Bipolar Disorder  Prior Outpatient Therapy Prior Outpatient Therapy: Yes Prior Therapy Dates:  (2016 and prior dates) Prior Therapy Facilty/[facility]): [facility] [place] Reason for Treatment: Med mgnt Does patient have an ACCT team?: No Does patient have Intensive In-House Services?  : No Does patient have [facility] services? : No Does patient have P4CC services?: No  ADL Screening (condition at time of admission) Patient's  cognitive ability adequate to safely complete daily activities?: Yes Is the patient deaf or have difficulty hearing?: No Does the patient have difficulty seeing, even when wearing glasses/contacts?: No Does the patient have difficulty concentrating, remembering, or making decisions?: No Patient able to express need for assistance with ADLs?: Yes Does the patient have difficulty dressing or bathing?: No Independently performs ADLs?: Yes (appropriate for developmental age) Does the patient have difficulty walking or climbing stairs?: No  Home Assistive Devices/Equipment Home Assistive Devices/Equipment: None    Abuse/Neglect Assessment (Assessment to be complete while patient is alone) Physical Abuse: Denies Verbal Abuse: Denies Sexual Abuse: Denies Exploitation of patient/patient's resources: Denies Self-Neglect: Denies Values / Beliefs Cultural Requests During Hospitalization: None Spiritual Requests During Hospitalization: None Consults Spiritual Care Consult Needed: No Social Work Consult Needed: No Merchant navy officer (For Healthcare) Does patient have an advance directive?: No Would patient like information on creating an advanced directive?: No - patient declined information    Additional Information 1:1 In Past 12 Months?: No CIRT Risk: No Elopement Risk: No Does patient have medical clearance?: Yes     Disposition:  Disposition Initial Assessment Completed for this Encounter: Yes Disposition of Patient: Inpatient treatment program, Referred to Type of inpatient treatment program: Adult  Casimer Lanius, MS, [facility] Therapeutic Triage Specialist [facility]   07/28/2015 1:33 PM

## 2015-07-28 NOTE — ED Provider Notes (Signed)
CSN: [id number]     Arrival date & time 07/28/15  4098 History   First MD Initiated Contact with Patient 07/28/15 [phone]     Chief Complaint  Patient presents with  . V70.1     (Consider location/radiation/quality/duration/timing/severity/associated sxs/prior Treatment) The history is provided by the patient.   Alex Wilson is a 49 y.o. male reporting a long standing history of anger issues, since childhood and has had exacerbation of this problem escalating to homicidal ideation.  He has anger toward several acquaintances at this time, has even gone out "looking for them" and knows he would have killed them if he had been able to make contact.  He does not say how he would harm them, but does endorse having weapons.  He would not elaborate on this statement.  He is here voluntarily, recognizing he needs assistance.  He is followed by [facility] who placed him on topamax and depakote.  He states it may be helpful, but he does not take consistently.  He endorses polysubstance abuse, last used cocaine about 3 weeks ago.  He also drinks daily, but denies having an alcohol addiction.  His last etoh was 2 glasses of wine yesterday.  Denies h/o withdrawal symptoms.  Denies suicidal ideation.   Past Medical History  Diagnosis Date  . Difficulty controlling anger    Past Surgical History  Procedure Laterality Date  . Finger surgery     No family history on file. Social History  Substance Use Topics  . Smoking status: Never Smoker   . Smokeless tobacco: None  . Alcohol Use: Yes     Comment: most days    Review of Systems  Constitutional: Negative for fever and chills.  HENT: Negative for congestion and sore throat.   Eyes: Negative.   Respiratory: Negative for shortness of breath.   Cardiovascular: Negative for chest pain.  Gastrointestinal: Negative for nausea and abdominal pain.  Genitourinary: Negative.   Musculoskeletal: Negative for arthralgias and neck pain.  Skin:  Negative.  Negative for rash and wound.  Neurological: Negative for weakness and headaches.  Psychiatric/Behavioral: Negative.  Negative for suicidal ideas.      Allergies  Review of patient's allergies indicates no known allergies.  Home Medications   Prior to Admission medications   Medication Sig Start Date End Date Taking? Authorizing Provider  diazepam (VALIUM) 5 MG tablet Take 1 tablet (5 mg total) by mouth every 8 (eight) hours as needed for muscle spasms. 10/30/14   Kristen N Ward, DO  ibuprofen (ADVIL,MOTRIN) 800 MG tablet Take 1 tablet (800 mg total) by mouth every 8 (eight) hours as needed for mild pain. 10/30/14   Kristen N Ward, DO  Multiple Vitamin (MULTIVITAMIN WITH MINERALS) TABS tablet Take 1 tablet by mouth daily.    Historical Provider, MD  naproxen sodium (ALEVE) 220 MG tablet Take 440 mg by mouth 2 (two) times daily as needed (Pain).    Historical Provider, MD  oxyCODONE-acetaminophen (PERCOCET/ROXICET) 5-325 MG per tablet Take 1 tablet by mouth every 4 (four) hours as needed. 10/30/14   Kristen N Ward, DO  permethrin (ELIMITE) 5 % cream Apply to affected area once Patient not taking: Reported on 10/30/2014 12/07/12   Zadie Rhine, MD   BP 140/83 mmHg  Pulse 78  Temp(Src) 97.7 F (36.5 C) (Oral)  Resp 18  Ht 5\' 5"  (1.651 m)  Wt 170 lb (77.111 kg)  BMI 28.29 kg/m2  SpO2 100% Physical Exam  Constitutional: He appears well-developed  and well-nourished.  HENT:  Head: Normocephalic and atraumatic.  Eyes: Conjunctivae are normal.  Neck: Normal range of motion.  Cardiovascular: Normal rate, regular rhythm, normal heart sounds and intact distal pulses.   Pulmonary/Chest: Effort normal and breath sounds normal. He has no wheezes.  Abdominal: Soft. Bowel sounds are normal. There is no tenderness.  Musculoskeletal: Normal range of motion.  Neurological: He is alert.  Skin: Skin is warm and dry.  Psychiatric: His speech is normal. His mood appears anxious. He is  not agitated, not aggressive, not actively hallucinating and not combative. Cognition and memory are normal. He expresses homicidal ideation. He expresses homicidal plans.  Nursing note and vitals reviewed.   ED Course  Procedures (including critical care time) Labs Review Labs Reviewed  CBC WITH DIFFERENTIAL/PLATELET  COMPREHENSIVE METABOLIC PANEL  ETHANOL  URINE RAPID DRUG SCREEN, HOSP PERFORMED    Imaging Review No results found. I have personally reviewed and evaluated these images and lab results as part of my medical decision-making.   EKG Interpretation None      MDM   Final diagnoses:  None    Medical screening instituted.  Will have TSS assess for possible placement.  Call from TSS assessor.  Pt with active homicidal ideation,  Apparently also has diagnosis of bipolar from outside hospital as well. Recommends placement,  Pending bed at this time.    He was inquiring about leaving, questioning his ability to do this since he came in voluntarily.  Felt unsafe potentially to others to allow him to leave.  Dr. Fayrene Fearing signed involuntary commitment papers.     Burgess Amor, PA-C 07/28/15 1525  Rolland Porter, MD 07/29/15 [phone]

## 2015-07-28 NOTE — ED Notes (Signed)
Pt has black bag of belonging in locker room for when he is to go to a longer facility. Security has checked this bag.

## 2015-07-28 NOTE — Progress Notes (Signed)
Referral for patient was made to the following facilities: [facility] - per [place], fax referral for review. [facility] - per intake, fax referral. [facility] - per intake, fax referral. [facility] - per intake, fax referral.  At capacity: [facility] - per [facility] - per [facility] - per [facility] - no adult medicaid. [facility] - per intake, no aggressive  CSW will continue to seek placement.  Melbourne Abts, LCSWA Disposition staff 07/28/2015 9:15 PM

## 2015-07-29 NOTE — Progress Notes (Signed)
Disposition CSW completed additional referrals to the following inpatient psych faciliites:  [facility]  CSW will continue to assist with placement needs.  Seward Speck [facility] Disposition CSW [phone]

## 2015-07-29 NOTE — ED Notes (Signed)
Pt resting calmly w/ eyes closed. Rise & fall of the chest noted. Sitter remains in sight of the pt. Bed in low position, side rails up x2. NAD noted at this time.

## 2015-07-29 NOTE — ED Notes (Signed)
Pt taking shower.  

## 2015-07-30 ENCOUNTER — Encounter (HOSPITAL_COMMUNITY): Payer: Self-pay | Admitting: *Deleted

## 2015-07-30 ENCOUNTER — Inpatient Hospital Stay (HOSPITAL_COMMUNITY)
Admission: AD | Admit: 2015-07-30 | Discharge: 2015-08-03 | DRG: 885 | Disposition: A | Discharge status: Home / Self Care | Payer: No Typology Code available for payment source | Source: Intra-hospital | Attending: Psychiatry | Admitting: Psychiatry

## 2015-07-30 ENCOUNTER — Encounter (HOSPITAL_COMMUNITY): Payer: Self-pay | Admitting: Nurse Practitioner

## 2015-07-30 DIAGNOSIS — G4700 Insomnia, unspecified: Secondary | ICD-10-CM | POA: Diagnosis present

## 2015-07-30 DIAGNOSIS — F6381 Intermittent explosive disorder: Secondary | ICD-10-CM | POA: Diagnosis not present

## 2015-07-30 DIAGNOSIS — F39 Unspecified mood [affective] disorder: Principal | ICD-10-CM | POA: Diagnosis present

## 2015-07-30 DIAGNOSIS — R45850 Homicidal ideations: Secondary | ICD-10-CM | POA: Diagnosis present

## 2015-07-30 DIAGNOSIS — F411 Generalized anxiety disorder: Secondary | ICD-10-CM | POA: Diagnosis present

## 2015-07-30 DIAGNOSIS — F4310 Post-traumatic stress disorder, unspecified: Secondary | ICD-10-CM | POA: Insufficient documentation

## 2015-07-30 DIAGNOSIS — Z9114 Patient's other noncompliance with medication regimen: Secondary | ICD-10-CM | POA: Diagnosis present

## 2015-07-30 HISTORY — DX: Anxiety disorder, unspecified: F41.9

## 2015-07-30 MED ORDER — ALUM & MAG HYDROXIDE-SIMETH 200-200-20 MG/5ML PO SUSP
30.0000 mL | ORAL | Status: DC | PRN
  Administered 2015-07-31 – 2015-08-01 (×2): 30 mL via ORAL
  Filled 2015-07-30 (×2): qty 30

## 2015-07-30 MED ORDER — TRAZODONE HCL 50 MG PO TABS
50.0000 mg | ORAL_TABLET | Freq: Every evening | ORAL | Status: DC | PRN
  Filled 2015-07-30: qty 7

## 2015-07-30 MED ORDER — ACETAMINOPHEN 325 MG PO TABS: 650.0000 mg | ORAL_TABLET | Freq: Four times a day (QID) | ORAL | Status: DC | PRN

## 2015-07-30 MED ORDER — TOPIRAMATE 100 MG PO TABS
100.0000 mg | ORAL_TABLET | Freq: Every day | ORAL | Status: DC
  Administered 2015-07-30 – 2015-08-02 (×4): 100 mg via ORAL
  Filled 2015-07-30 (×6): qty 1

## 2015-07-30 MED ORDER — MAGNESIUM HYDROXIDE 400 MG/5ML PO SUSP: 30.0000 mL | Freq: Every day | ORAL | Status: DC | PRN

## 2015-07-30 MED ORDER — BUSPIRONE HCL 10 MG PO TABS
10.0000 mg | ORAL_TABLET | Freq: Two times a day (BID) | ORAL | Status: DC
  Administered 2015-07-30 – 2015-08-03 (×8): 10 mg via ORAL
  Filled 2015-07-30 (×6): qty 1
  Filled 2015-07-30: qty 2
  Filled 2015-07-30 (×4): qty 1

## 2015-07-30 MED ORDER — DIVALPROEX SODIUM ER 500 MG PO TB24
500.0000 mg | ORAL_TABLET | Freq: Every day | ORAL | Status: DC
  Administered 2015-07-31: 500 mg via ORAL
  Filled 2015-07-30 (×3): qty 1

## 2015-07-30 NOTE — Progress Notes (Signed)
[facility] Group Notes:  (Nursing/MHT/Case Management/Adjunct)  Date:  07/30/2015  Time:  9:38 PM  Type of Therapy:  Psychoeducational Skills  Participation Level:  Active  Participation Quality:  Appropriate  Affect:  Appropriate  Cognitive:  Appropriate  Insight:  Appropriate  Engagement in Group:  Engaged  Modes of Intervention:  Education  Summary of Progress/Problems: The patient shared with the group that he had a long day since he spent part of the day in another hospital, was sent to jail, and then eventually transferred to this building. He is grateful for being here in the hospital and is looking forward to talking to his doctor and case manager tomorrow. As a theme for the day, his support system will be made up of his ex fiance, and mother-in-law.   Hazle Coca S 07/30/2015, 9:38 PM

## 2015-07-30 NOTE — ED Notes (Signed)
[facility] at bedside for transport to facility.

## 2015-07-30 NOTE — Progress Notes (Signed)
Patient ID: Alex Wilson, male   DOB: 1966/04/20, 49 y.o.   MRN: [id number] ADMISSION  NOTE  ---  49 year old male admitted in-voluntarily after having HI toward a " certain person in that has caused me trouble in my life".   Pt. Has HX of Schizophrenia and Psychotic behaviors.  Pt. Has been non-compliant on his meds  " for a while ".   Pt. [place] Spaniel he wants to get back on track with his medications.  Pt. Has HX of voices telling him various things and said he frequently talks to himself and to the voices.   Pt. Said he has HX of anger management issues and needs help with control.  Pt. Recently broke up with his  fiancee and tthey are now just " good friends".  Pt. Appears to have G/L over this.   On admission, pt. Was talkative and appeared vested in treatment.  He agreed to contract for safety and denied pain.  Food tray was provided
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
## 2015-07-30 NOTE — Tx Team (Addendum)
Initial Interdisciplinary Treatment Plan   PATIENT STRESSORS: Financial difficulties Marital or family conflict   PATIENT STRENGTHS: Average or above average intelligence General fund of knowledge Physical Health   PROBLEM LIST: Problem List/Patient Goals Date to be addressed Date deferred Reason deferred Estimated date of resolution  Increased risk for suicide 07/30/2015     Aggression/ Anger 07/30/2015     Alteration in mood 07/30/2015     Increased anxiety 07/30/2015     "Get back on medications" 08/01/15     "Follow my treatment plan" 08/01/15                        DISCHARGE CRITERIA:  Ability to meet basic life and health needs Adequate post-discharge living arrangements Need for constant or close observation no longer present  PRELIMINARY DISCHARGE PLAN: Outpatient therapy Return to previous living arrangement Return to previous work or school arrangements  PATIENT/FAMIILY INVOLVEMENT: This treatment plan has been presented to and reviewed with the patient, Alex Wilson, and/or family member, .  The patient and family have been given the opportunity to ask questions and make suggestions.  Harvel Quale 07/30/2015, 6:48 PM

## 2015-07-31 ENCOUNTER — Encounter (HOSPITAL_COMMUNITY): Payer: Self-pay | Admitting: Psychiatry

## 2015-07-31 DIAGNOSIS — F4310 Post-traumatic stress disorder, unspecified: Secondary | ICD-10-CM

## 2015-07-31 DIAGNOSIS — F6381 Intermittent explosive disorder: Secondary | ICD-10-CM

## 2015-07-31 DIAGNOSIS — R45850 Homicidal ideations: Secondary | ICD-10-CM

## 2015-07-31 MED ORDER — DIVALPROEX SODIUM ER 500 MG PO TB24
750.0000 mg | ORAL_TABLET | Freq: Every day | ORAL | Status: DC
  Administered 2015-08-01 – 2015-08-03 (×3): 750 mg via ORAL
  Filled 2015-07-31 (×6): qty 1

## 2015-07-31 MED ORDER — DIAZEPAM 2 MG PO TABS
2.0000 mg | ORAL_TABLET | Freq: Two times a day (BID) | ORAL | Status: DC | PRN
  Administered 2015-07-31 – 2015-08-02 (×3): 2 mg via ORAL
  Filled 2015-07-31 (×3): qty 1

## 2015-07-31 NOTE — Progress Notes (Signed)
Pt was visible on the unit.  Attending groups.  Interacting with peers.  Denies SI or A/V hallucinations.  He admits to having homicidal thoughts but will not disclose who he is homicidal towards.  Self inventory completed, depression 0/10, hopelessness 0/10 and anxiety 3/10.  He states that his goal for today is working on "overwhelming thoughts of harming other people and working on letting go of the things I cannot control."  He had multiple questions about filing for disability and involving his best friend in his care.  Referred him to the social work to discuss these issues.  He denies any pain and he appears to be in no physical distress.  He talked to me about his relationship with his ex-girlfriend.  "I don't know how to handle the relationship, she keeps telling me that she loves me."  Encouraged him to talk with her and find out what she means by the "I love you."  Reviewed his new medications and encouraged him to let staff know if he is feeling anxious.  Encouraged him to participate in group and unit activities.  Q 15 minute checks for safety maintained.

## 2015-07-31 NOTE — Plan of Care (Signed)
Problem: Ineffective individual coping Goal: STG: Pt will be able to identify effective and ineffective STG: Pt will be able to identify effective and ineffective coping patterns  Outcome: Progressing Alex Wilson reports that he is wanting to get back on his medication and work on trying to effectively handle his coping skills because "harming someone will land me prison and I need to be there for my daughter."

## 2015-07-31 NOTE — Progress Notes (Signed)
Patient ID: Alex Wilson, male   DOB: Jan 31, 1966, 49 y.o.   MRN: [id number] D: Client visible on the unit, in day room, interactive, reports depression "3",  anxiety "5" "great day" "getting better today" "can tell a difference with my medications" "I'm getting over wanting to hurt somebody" Client notes groups have been helpful "today's group was on protective factors using hobbies and stuff to change your thinking" "I'll use machine shop and welding to help divert my thoughts, don't want no jail time" "work on me"  A: Clinical research associate provided emotional support listening, also commended client on knowing options to use to help divert negative thoughts, encouraged group. Reviewed medications, administered as ordered. Staff will monitor q38min for safety. R: Client is safe on the unit, attended group.
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
## 2015-07-31 NOTE — [facility] Group Notes (Signed)
[facility] LCSW Group Therapy  07/31/2015 1:15 pm  Type of Therapy: Process Group Therapy  Participation Level:  Active  Participation Quality:  Appropriate  Affect:  Flat  Cognitive:  Oriented  Insight:  Improving  Engagement in Group:  Limited  Engagement in Therapy:  Limited  Modes of Intervention:  Activity, Clarification, Education, Problem-solving and Support  Summary of Progress/Problems: Today's group addressed the issue of overcoming obstacles.  Patients were asked to identify their biggest obstacle post d/c that stands in the way of their on-going success, and then problem solve as to how to manage this.  Treyven was engaged and active throughout.  Talked about how he can be his own worst enemy because of his unwillingness to allow others to become close to him.  "There are many people who have expressed an interest, but I have never allowed them in.  It's a protection that I use, and put up, to keep people away.  But I'm tired of it.  And I'm tired of being in hospitals and feeling like I have accomplished nothing.  Something has to change."  Stated he is like a lot of other people who are looking for an instant fix, but he understands that he needs to apply some self discipline to getting better.  Got lots of feedback from others.  Daryel Gerald B 07/31/2015   4:00 PM
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
## 2015-08-01 NOTE — Progress Notes (Signed)
Patient ID: THEON SOBOTKA, male   DOB: May 17, 1966, 49 y.o.   MRN: [id number] D: Client visible in dayroom watching TV and on the phone. Client appeared to get agitated during phone class and was noted make several calls, back to back. Although client did exercise coping skills by putting the phone down when other person on the line became loud, then coming back to speak to them.  Prior to phone calls client reports "doing great going home tomorrow or Thursday, looking forward to it" client noted groups have been helpful "made a difference being here, mellowed out, let things go, can't change things" After phone calls client remarked "family can be your biggest problem" "women are dammed crazy" A: Writer provided positive reinforcement, by commending client for using his coping skills in removing himself from situation when others were loud and upset. Reviewed medication, administered as ordered. Staff will monitor q45min for safety. R: Client is safe on the unit, attended group.

## 2015-08-01 NOTE — [facility] Group Notes (Signed)
[facility] Group Notes:  (Nursing/MHT/Case Management/Adjunct)  Date:  08/01/2015  Time:  10:13 AM  Type of Therapy:  Nurse Education  Participation Level:  Did Not Attend  Participation Quality:    Affect:    Cognitive:    Insight:    Engagement in Group:    Modes of Intervention:    Summary of Progress/Problems:  Group topic was Recovery.  Discussed coping skills and setting goals.  Encouraged to work on Engineer, maintenance for coping skills to use when feeling bad.  He did not attend group as he was meeting with the Child psychotherapist.  Norm Parcel Reddick 08/01/2015, 10:13 AM

## 2015-08-01 NOTE — Plan of Care (Signed)
Problem: Aggression Towards others,Towards Self, and or Destruction Goal: STG-Patient will comply with prescribed medication regimen (Patient will comply with prescribed medication regimen)  Outcome: Progressing Domonik states that he is planning on taking his medications on a regular basis.  He feels that he needs to do this so he can take care of his family.  Since being on medications, he denies any homicidal thoughts.  "I don't need to do anything stupid that will land me in jail."
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
## 2015-08-01 NOTE — Progress Notes (Addendum)
Alex Wilson has been visible on the unit.  He denies any SI but admits to some homicidal ideation but it is better than before he came to the hospital.  He denies any A/V hallucinations.  He didn't attend morning group this morning due to meeting with the social worker.  He has taken his medications with difficulty.  He completed his self inventory and reports that his depression is a 3/10, hopelessness 0/10 and anxiety 3/10.  He states that his goal for the day is "get ready to go home and focus on taking my meds."  He voiced no physical complaints or pain.  He appears to be in no physical distress.  Talked with him about the importance in taking his medications on a regular basis.  He was upset that he couldn't reach his friend on the phone but was able to talk with her.  He then stated that he was ready to go home and wanted Clinical research associate to talk with the doctor.  Encouraged him to talk with the doctor to discuss discharging tomorrow.  Q 15 minute checks maintained for safety.  We will continue to monitor the progress towards his goals.  Later this afternoon, Alex Wilson reports that he hasn't been having homicidal thoughts.  "I have had time to think and I don't want to do anything stupid and end up in jail."   He is hoping that he will be discharged soon because he is ready to get out.  Informed him that he will need to discuss this with the doctor.

## 2015-08-01 NOTE — [facility] Suicide Risk Assessment (Signed)
[facility] INPATIENT:  Family/Significant Other Suicide Prevention Education  Suicide Prevention Education:  Education Completed; No one has been identified by the patient as the family member/significant other with whom the patient will be residing, and identified as the person(s) who will aid the patient in the event of a mental health crisis (suicidal ideations/suicide attempt).  With written consent from the patient, the family member/significant other has been provided the following suicide prevention education, prior to the and/or following the discharge of the patient.  The suicide prevention education provided includes the following:  Suicide risk factors  Suicide prevention and interventions  National Suicide Hotline telephone number  [facility] assessment telephone number  [facility] Emergency Assistance 911  [facility] and/or Residential Mobile Crisis Unit telephone number  Request made of family/significant other to:  Remove weapons (e.g., guns, rifles, knives), all items previously/currently identified as safety concern.    Remove drugs/medications (over-the-counter, prescriptions, illicit drugs), all items previously/currently identified as a safety concern.  The family member/significant other verbalizes understanding of the suicide prevention education information provided.  The family member/significant other agrees to remove the items of safety concern listed above. The patient did not endorse SI at the time of admission, nor did the patient c/o SI during the stay here.  SPE not required.   Daryel Gerald B 08/01/2015, 8:34 AM

## 2015-08-02 NOTE — Progress Notes (Signed)
D. Pt has been in the dayroom watching TV this morning and interacting with peers. Pt presented with jovial mood and bright affect in the unit. Pt stated he had a good night sleep, good appetite, normal energy level, and good concentration. Pt stated is gaol for to day is" getting ready to go home and know that I am ready to face the outside world and obstacles" Pt denies any physical pain, took his medication as scheduled and has been cooperative with the unit rules.D: Pt continues to be very flat and depressed on the unit today. Pt reported that his depression was a 3, his hopelessness was a 0, and that his anxiety was a 5. Pt reported being negative SI/HI, no AH/VH noted. A: 15 min checks continued for patient safety. R: Pts safety maintained.

## 2015-08-02 NOTE — Progress Notes (Signed)
Patient ID: Alex Wilson, male   DOB: 08-09-1966, 49 y.o.   MRN: [id number] [facility] MD Progress Note  08/02/2015 5:53 PM DESMAN POLAK  MRN:  [id number] Subjective: He states that he has been doing well in general, motivated in positive life change and more quality time with his family after discharge.  He does state that he felt temporarily upset and irritated after a phone call with his mother. States " she is always loud and dramatic, I was talking about needing to change the oil in our car, and she managed to make into this big thing". States , however, that he is learning to disengage and not to respond to unnecessary drama and to stay focused on positives .  Denies medication side effects. Objective : I have discussed case with treatment team and have met with patient. Patient continues to present calm, pleasant , euthymic and engaged in milieu. No agitated or disruptive behaviors. No explosive outbursts. Tolerating medications well. Continues to deny any homicidal or violent ideations.  Responsive to support, encouragement, praise regarding his efforts to change his life for the better .   Principal Problem: Homicidal ideation Diagnosis:   Patient Active Problem List   Diagnosis Date Noted  . Homicidal ideation [R45.850] 07/30/2015   Total Time spent with patient: 20 minutes   Past Medical History:  Past Medical History  Diagnosis Date  . Difficulty controlling anger   . Anxiety     Past Surgical History  Procedure Laterality Date  . Finger surgery     Family History: History reviewed. No pertinent family history. Social History:  History  Alcohol Use  . Yes    Comment: most days     History  Drug Use  . Yes  . Special: Cocaine    Social History   Social History  . Marital Status: Divorced    Spouse Name: N/A  . Number of Children: N/A  . Years of Education: N/A   Social History Main Topics  . Smoking status: Never Smoker   . Smokeless tobacco: None  .  Alcohol Use: Yes     Comment: most days  . Drug Use: Yes    Special: Cocaine  . Sexual Activity: Not Asked   Other Topics Concern  . None   Social History Narrative   Additional History:    Sleep: Good  Appetite:  Good   Assessment:   Musculoskeletal: Strength & Muscle Tone: within normal limits Gait & Station: normal Patient leans: N/A   Psychiatric Specialty Exam: Physical Exam  ROS denies headache, denies chest pain, denies shortness of breath  Blood pressure 128/79, pulse 81, temperature 97.6 F (36.4 C), temperature source Oral, resp. rate 19, height 5' 3.98" (1.625 m), weight 162 lb (73.483 kg), SpO2 100 %.Body mass index is 27.83 kg/(m^2).  General Appearance: Well Groomed  Engineer, water::  Good  Speech:  Normal Rate  Volume:  Normal  Mood:  improved - today  euthymic   Affect:  Appropriate, reactive   Thought Process:  Linear  Orientation:  Full (Time, Place, and Person)  Thought Content:  denies hallucinations, no delusions   Suicidal Thoughts:  No denies any self injurious ideations or any SI  Homicidal Thoughts:  No continues to deny any thoughts of HI or violence   Memory:  recent and remote grossly intact   Judgement:  Other:  improved   Insight:  Good  Psychomotor Activity:  Normal  Concentration:  Good  Recall:  Good  Fund of Knowledge:Good  Language: Good  Akathisia:  Negative  Handed:  Right  AIMS (if indicated):     Assets:  Communication Skills Desire for Improvement Resilience Social Support  ADL's:  Intact  Cognition: WNL  Sleep:  Number of Hours: 6     Current Medications: Current Facility-Administered Medications  Medication Dose Route Frequency Provider Last Rate Last Dose  . acetaminophen (TYLENOL) tablet 650 mg  650 mg Oral Q6H PRN Kerrie Buffalo, NP      . alum & mag hydroxide-simeth (MAALOX/MYLANTA) 200-200-20 MG/5ML suspension 30 mL  30 mL Oral Q4H PRN Kerrie Buffalo, NP   30 mL at 08/01/15 2254  . busPIRone (BUSPAR)  tablet 10 mg  10 mg Oral BID Harriet Butte, NP   10 mg at 08/02/15 1700  . diazepam (VALIUM) tablet 2 mg  2 mg Oral Q12H PRN Jenne Campus, MD   2 mg at 08/01/15 2150  . divalproex (DEPAKOTE ER) 24 hr tablet 750 mg  750 mg Oral Daily Jenne Campus, MD   750 mg at 08/02/15 0820  . magnesium hydroxide (MILK OF MAGNESIA) suspension 30 mL  30 mL Oral Daily PRN Kerrie Buffalo, NP      . topiramate (TOPAMAX) tablet 100 mg  100 mg Oral QHS Harriet Butte, NP   100 mg at 08/01/15 2149  . traZODone (DESYREL) tablet 50 mg  50 mg Oral QHS PRN Kerrie Buffalo, NP        Lab Results: No results found for this or any previous visit (from the past 48 hour(s)).  Physical Findings: AIMS: Facial and Oral Movements Muscles of Facial Expression: None, normal Lips and Perioral Area: None, normal Jaw: None, normal Tongue: None, normal,Extremity Movements Upper (arms, wrists, hands, fingers): None, normal Lower (legs, knees, ankles, toes): None, normal, Trunk Movements Neck, shoulders, hips: None, normal, Overall Severity Severity of abnormal movements (highest score from questions above): None, normal Incapacitation due to abnormal movements: None, normal Patient's awareness of abnormal movements (rate only patient's report): No Awareness,    CIWA:    COWS:      Assessment -  Patient continues to improve, stabilize and remains calm, cooperative, engaged and presents euthymic at this time . He has not demonstrated any disruptive or agitated behaviors on the unit and has continued to deny any violent or homicidal ideations. He is tolerating medications well at this time.   Treatment Plan Summary: Daily contact with patient to assess and evaluate symptoms and progress in treatment, Medication management, Plan inpatient admission and medications as below Depakote ER 750 mgrs QHS for mood disorder and intermittent explosiveness  Valium 2 mgrs BID PRN severe anxiety Trazodone 50 mgrs QHS  PRN for insomnia  as needed  Buspar 10 mgrs BID for anxiety Topamax 100 mgrs QDAY for mood disorder  -Continue to encourage milieu , group participation to help improve mood and coping skills  -CSW, team working on disposition planning/ options . Consider discharge soon as he continues to improve.  Obtain Valproic Acid Serum level in AM.  Medical Decision Making:  Established Problem, Stable/Improving (1), Review of Psycho-Social Stressors (1), Review or order clinical lab tests (1) and Review of Medication Regimen & Side Effects (2)     COBOS, FERNANDO 08/02/2015, 5:53 PM

## 2015-08-02 NOTE — Plan of Care (Signed)
Problem: Diagnosis: Increased Risk For Suicide Attempt Goal: LTG-Patient Will Report Improved Mood and Deny Suicidal LTG (by discharge) Patient will report improved mood and deny suicidal ideation.  Outcome: Not Progressing Pt denies SI, " I love myself too much to kill myself"

## 2015-08-02 NOTE — [facility] Group Notes (Signed)
[facility] LCSW Group Therapy  08/02/2015 1:48 PM  Type of Therapy: Group Therapy  Participation Level: Active  Participation Quality: Attentive  Affect: Flat  Cognitive: Oriented  Insight: Limited  Engagement in Therapy: Engaged  Modes of Intervention: Discussion and Socialization  Summary of Progress/Problems: Onalee Hua from [employer] was here to tell his story of recovery and play his guitar. Pt was alert and engaged throughout the entire group. Pt seemed interested in what the speaker had to say and asked questions when appropriate. Pt was particularly interested in the speaker's story and was able to connect with speaker about his mental health concerns and addictions. Vito Backers. Beverely Pace 08/02/2015 1:48 PM

## 2015-08-02 NOTE — [facility] Group Notes (Signed)
[facility] LCSW Aftercare Discharge Planning Group Note   08/02/2015 11:01 AM  Participation Quality:  Engaged  Mood/Affect:  Tearful  Depression Rating:    Anxiety Rating:    Thoughts of Suicide:  No Will you contract for safety?   NA  Current AVH:  No  Plan for Discharge/Comments:  Continues to be resolute in his plans to change by taking meds, not drinking nor drugging, and "letting things unfold as they should rather than trying to control things, which I know I don't have any control over anyway."  Talked about how wants to present a positive example for 64 YO son who is following in his negative footsteps.  Hopes to leave today instead of tomorrow.  Denies HI today.  "I got too much to lose to do that."  Transportation Means:   Supports:  Alex Wilson

## 2015-08-02 NOTE — Progress Notes (Signed)
Adult Psychoeducational Group Note  Date:  08/02/2015 Time:  8:55 PM  Group Topic/Focus:  Wrap-Up Group:   The focus of this group is to help patients review their daily goal of treatment and discuss progress on daily workbooks.  Participation Level:  Active  Participation Quality:  Appropriate  Affect:  Appropriate  Cognitive:  Appropriate  Insight: Appropriate  Engagement in Group:  Engaged  Modes of Intervention:  Discussion  Additional Comments: The patient expressed that his day was a 8.The patient also said that his day was good.  Octavio Manns 08/02/2015, 8:55 PM

## 2015-08-02 NOTE — Plan of Care (Signed)
Problem: Aggression Towards others,Towards Self, and or Destruction Goal: LTG-Pt initiate timeout/other activity,confront AngerTrigger (Patient will initiate time out or other activity when confronting situations which trigger anger)  Outcome: Progressing Client takes time out when angered AEB phone conversation, Clinical research associate overhead persons voice escalates on other end of the phone and client put the phone down for a few minutes and then walked back to talk to caller, eventually hanging the phone up.

## 2015-08-03 DIAGNOSIS — F4310 Post-traumatic stress disorder, unspecified: Secondary | ICD-10-CM | POA: Insufficient documentation

## 2015-08-03 LAB — VALPROIC ACID LEVEL: Valproic Acid Lvl: 32 ug/mL — ABNORMAL LOW (ref 50.0–100.0)

## 2015-08-03 MED ORDER — BUSPIRONE HCL 10 MG PO TABS: 10.0000 mg | ORAL_TABLET | Freq: Two times a day (BID) | ORAL | Status: DC

## 2015-08-03 MED ORDER — TOPIRAMATE 100 MG PO TABS: 100.0000 mg | ORAL_TABLET | Freq: Every day | ORAL | Status: DC

## 2015-08-03 MED ORDER — INFLUENZA VAC SPLIT QUAD 0.5 ML IM SUSY
0.5000 mL | PREFILLED_SYRINGE | INTRAMUSCULAR | Status: AC | PRN
  Administered 2015-08-03: 0.5 mL via INTRAMUSCULAR

## 2015-08-03 MED ORDER — DIVALPROEX SODIUM ER 500 MG PO TB24
1000.0000 mg | ORAL_TABLET | Freq: Every day | ORAL | Status: DC
  Filled 2015-08-03: qty 2

## 2015-08-03 MED ORDER — DIVALPROEX SODIUM ER 500 MG PO TB24: 1000.0000 mg | ORAL_TABLET | Freq: Every day | ORAL | Status: DC

## 2015-08-03 MED ORDER — INFLUENZA VAC SPLIT QUAD 0.5 ML IM SUSY
0.5000 mL | PREFILLED_SYRINGE | INTRAMUSCULAR | Status: DC
  Filled 2015-08-03: qty 0.5

## 2015-08-03 MED ORDER — DIAZEPAM 2 MG PO TABS: 2.0000 mg | ORAL_TABLET | Freq: Two times a day (BID) | ORAL | Status: DC | PRN

## 2015-08-03 MED ORDER — TRAZODONE HCL 50 MG PO TABS: 50.0000 mg | ORAL_TABLET | Freq: Every evening | ORAL | Status: DC | PRN

## 2015-08-03 NOTE — Progress Notes (Signed)
  [facility] Adult Case Management Discharge Plan :  Will you be returning to the same living situation after discharge:  Yes,  home At discharge, do you have transportation home?: Yes,  friend Do you have the ability to pay for your medications: Yes,  mental health  Release of information consent forms completed and in the chart;  Patient's signature needed at discharge.  Patient to Follow up at: Follow-up Information    Follow up with [facility] On 08/08/2015.   Why:  Tuesday at 9:00 for your hospital follow up appointment   Contact information:   [address]  [phone]      Patient denies SI/HI: Yes,  yes    Safety Planning and Suicide Prevention discussed: Yes,  yes  Have you used any form of tobacco in the last 30 days? (Cigarettes, Smokeless Tobacco, Cigars, and/or Pipes): No  Has patient been referred to the Quitline?: N/A patient is not a smoker  Alex Wilson 08/03/2015, 8:33 AM

## 2015-08-03 NOTE — [facility] Suicide Risk Assessment (Signed)
[facility] Discharge Suicide Risk Assessment   Demographic Factors:  49 year old male, lives with mother , has Two children .   Total Time spent with patient: 30 minutes  Musculoskeletal: Strength & Muscle Tone: within normal limits Gait & Station: normal Patient leans: N/A  Psychiatric Specialty Exam: Physical Exam  ROS  Blood pressure 109/71, pulse 85, temperature 97.2 F (36.2 C), temperature source Oral, resp. rate 18, height 5' 3.98" (1.625 m), weight 162 lb (73.483 kg), SpO2 100 %.Body mass index is 27.83 kg/(m^2).  General Appearance: Well Groomed  Patent attorney::  Good  Speech:  Normal Rate409  Volume:  Normal  Mood:  Euthymic  Affect:  Appropriate  Thought Process:  Linear  Orientation:  Full (Time, Place, and Person)  Thought Content:  denies hallucinations, no delusions, not internally preoccupied   Suicidal Thoughts:  No- denies any thoughts of hurting self or of SI  Homicidal Thoughts:  No- denies any thoughts of hurting anyone, or of HI  Memory:  recent and remote grossly intact   Judgement:  Other:  improved  Insight:  Present  Psychomotor Activity:  Normal  Concentration:  Good  Recall:  Good  Fund of Knowledge:Good  Language: Good  Akathisia:  Negative  Handed:  Right  AIMS (if indicated):     Assets:  Communication Skills Desire for Improvement Physical Health Resilience  Sleep:  Number of Hours: 6.25  Cognition: WNL  ADL's:  Intact   Have you used any form of tobacco in the last 30 days? (Cigarettes, Smokeless Tobacco, Cigars, and/or Pipes): No  Has this patient used any form of tobacco in the last 30 days? (Cigarettes, Smokeless Tobacco, Cigars, and/or Pipes) No  Mental Status Per Nursing Assessment::   On Admission:  Plan to harm others, Intention to act on plan to harm others  Current Mental Status by Physician:  At this time patient is much improved compared to his admission- he is fully alert, attentive, calm, pleasant, mood is euthymic, he is not  depressed, he presents with a full range of affect, no thought disorder, no suicidal ideations, no homicidal ideations and has not exhibited any explosive or violent behaviors on the unit, no delusions, future oriented, looking forward to seeing his family/daughter .   Loss Factors: Not taking medication for several weeks to months .  Historical Factors:  History of violence, and history of explosiveness in the past.   Risk Reduction Factors:   Responsible for children under 61 years of age, Sense of responsibility to family, Living with another person, especially a relative, Positive social support and Positive coping skills or problem solving skills  Continued Clinical Symptoms:  As noted, much improved and denying any HI or any violent ideations, has not presented with angry outbursts on unit. Tolerating medications well. At this time no side effects from Depakote. Valproic Acid is low, and agrees to titrating Depakote ER to 1000 mgrs QHS  Cognitive Features That Contribute To Risk:  No gross cognitive deficits noted upon discharge. Is alert , attentive, and oriented x 3     Suicide Risk:  Mild:  Suicidal ideation of limited frequency, intensity, duration, and specificity.  There are no identifiable plans, no associated intent, mild dysphoria and related symptoms, good self-control (both objective and subjective assessment), few other risk factors, and identifiable protective factors, including available and accessible social support.  Principal Problem: Homicidal ideation Discharge Diagnoses:  Patient Active Problem List   Diagnosis Date Noted  . Homicidal ideation [R45.850]  07/30/2015    Follow-up Information    Follow up with [facility] On 08/08/2015.   Why:  Tuesday at 9:00 for your hospital follow up appointment   Contact information:   [address]  [phone]      Plan Of Care/Follow-up recommendations:  Activity:  as tolerated  Diet:  regular  Tests:  NA Other:   see below   Is patient on multiple antipsychotic therapies at discharge:  No   Has Patient had three or more failed trials of antipsychotic monotherapy by history:  No  Recommended Plan for Multiple Antipsychotic Therapies: NA  Patient is leaving unit in good spirits . Plans to return home . Follow up as above .  COBOS, FERNANDO 08/03/2015, 9:26 AM
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
## 2015-08-03 NOTE — Progress Notes (Signed)
Pt reports he is doing well and feels the medications that he has been prescribed are working.  He states he plans to return home at discharge.  He denies SI/HI/AVH.  He states that it is true he was having some HI towards some people, but he realizes that acting on those thoughts is not worth it.  He says that he has good support from his family and that he is looking forward to watching his grandchildren grow up.  He has been pleasant and appropriate on the unit this evening.  He makes his needs known to staff.  He has spent most of the evening in the dayroom watching TV and did attend evening wrap up group.  Support and encouragement offered.  Safety maintained with q15 minute checks.
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
## 2015-08-03 NOTE — Progress Notes (Signed)
D/C Note: Pt. D/C from the unit to lobby accompanied by friend.  Pleasant and cooperative. Voiced no SI/HI or A/V hallucinations.  Pt. Denies any pain or discomfort.  D/C instructions and medications reviewed with pt.  Pt. verbalized understanding of medications and d/c instructions.   All belongings (from locker 20-wallet, bag, shoes, debit cards) returned to pt. Q 15 min checks maintained until discharge.  Pt. Left the unit in no apparent distress.
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
## 2016-02-20 ENCOUNTER — Telehealth: Payer: Self-pay | Admitting: Gastroenterology

## 2016-02-20 DIAGNOSIS — Z205 Contact with and (suspected) exposure to viral hepatitis: Secondary | ICD-10-CM

## 2016-02-20 NOTE — Telephone Encounter (Signed)
Patient has had exposure to Hepatitis B.   I have ordered labs that he needs to have done. His # is [phone].

## 2016-02-21 ENCOUNTER — Other Ambulatory Visit: Payer: Self-pay

## 2016-02-21 DIAGNOSIS — Z205 Contact with and (suspected) exposure to viral hepatitis: Secondary | ICD-10-CM

## 2016-02-21 LAB — HEPATITIS B CORE ANTIBODY, IGM: Hep B C IgM: NONREACTIVE

## 2016-02-21 LAB — HEPATITIS B SURFACE ANTIGEN: Hepatitis B Surface Ag: NEGATIVE

## 2016-02-21 LAB — HEPATITIS B SURFACE ANTIBODY,QUALITATIVE: HEP B S AB: NEGATIVE

## 2016-02-21 NOTE — Telephone Encounter (Signed)
Alex Wilson said she spoke with him and he will go to [facility] this morning. Labs have been released and sent to the lab.

## 2016-02-26 ENCOUNTER — Telehealth: Payer: Self-pay | Admitting: Internal Medicine

## 2016-02-26 NOTE — Telephone Encounter (Signed)
Routing to AS. According to AS this will be a [facility] pt.

## 2016-02-26 NOTE — Telephone Encounter (Signed)
Please see result note 

## 2016-02-26 NOTE — Progress Notes (Signed)
Quick Note:  Negative Hepatitis B. He will need the vaccination for Hep B. Would recommend Hep A vaccination as well, if he has not received this. ______

## 2016-02-26 NOTE — Telephone Encounter (Signed)
[phone]  PLEASE CALL PATIENT REGARDING HIS LABS

## 2016-03-04 NOTE — Progress Notes (Signed)
Quick Note:  Tried to call and no answer. ______

## 2016-03-05 NOTE — Progress Notes (Signed)
Quick Note:  Letter mailed to pt with the information and to call the office for more information. ______

## 2016-04-07 ENCOUNTER — Emergency Department (HOSPITAL_COMMUNITY): Payer: No Typology Code available for payment source

## 2016-04-07 ENCOUNTER — Encounter (HOSPITAL_COMMUNITY): Payer: Self-pay | Admitting: Emergency Medicine

## 2016-04-07 ENCOUNTER — Inpatient Hospital Stay (HOSPITAL_COMMUNITY)
Admission: EM | Admit: 2016-04-07 | Discharge: 2016-04-12 | DRG: 563 | Disposition: A | Discharge status: Home / Self Care | Payer: No Typology Code available for payment source | Attending: General Surgery | Admitting: General Surgery

## 2016-04-07 DIAGNOSIS — S2241XA Multiple fractures of ribs, right side, initial encounter for closed fracture: Secondary | ICD-10-CM | POA: Diagnosis present

## 2016-04-07 DIAGNOSIS — S27321A Contusion of lung, unilateral, initial encounter: Secondary | ICD-10-CM | POA: Diagnosis present

## 2016-04-07 DIAGNOSIS — S2220XA Unspecified fracture of sternum, initial encounter for closed fracture: Secondary | ICD-10-CM | POA: Diagnosis present

## 2016-04-07 DIAGNOSIS — S27329A Contusion of lung, unspecified, initial encounter: Secondary | ICD-10-CM

## 2016-04-07 DIAGNOSIS — IMO0002 Reserved for concepts with insufficient information to code with codable children: Secondary | ICD-10-CM

## 2016-04-07 DIAGNOSIS — T148XXA Other injury of unspecified body region, initial encounter: Secondary | ICD-10-CM

## 2016-04-07 DIAGNOSIS — S2221XA Fracture of manubrium, initial encounter for closed fracture: Secondary | ICD-10-CM | POA: Diagnosis present

## 2016-04-07 DIAGNOSIS — S92151A Displaced avulsion fracture (chip fracture) of right talus, initial encounter for closed fracture: Secondary | ICD-10-CM | POA: Diagnosis not present

## 2016-04-07 DIAGNOSIS — R338 Other retention of urine: Secondary | ICD-10-CM | POA: Diagnosis not present

## 2016-04-07 DIAGNOSIS — D62 Acute posthemorrhagic anemia: Secondary | ICD-10-CM | POA: Diagnosis not present

## 2016-04-07 DIAGNOSIS — R52 Pain, unspecified: Secondary | ICD-10-CM

## 2016-04-07 DIAGNOSIS — S82891A Other fracture of right lower leg, initial encounter for closed fracture: Secondary | ICD-10-CM

## 2016-04-07 DIAGNOSIS — F10929 Alcohol use, unspecified with intoxication, unspecified: Secondary | ICD-10-CM | POA: Diagnosis present

## 2016-04-07 DIAGNOSIS — M25462 Effusion, left knee: Secondary | ICD-10-CM

## 2016-04-07 DIAGNOSIS — S81012A Laceration without foreign body, left knee, initial encounter: Secondary | ICD-10-CM | POA: Diagnosis present

## 2016-04-07 DIAGNOSIS — R339 Retention of urine, unspecified: Secondary | ICD-10-CM | POA: Diagnosis not present

## 2016-04-07 DIAGNOSIS — S92101A Unspecified fracture of right talus, initial encounter for closed fracture: Secondary | ICD-10-CM | POA: Diagnosis present

## 2016-04-07 DIAGNOSIS — F10129 Alcohol abuse with intoxication, unspecified: Secondary | ICD-10-CM | POA: Diagnosis present

## 2016-04-07 DIAGNOSIS — S2231XA Fracture of one rib, right side, initial encounter for closed fracture: Secondary | ICD-10-CM

## 2016-04-07 DIAGNOSIS — S301XXA Contusion of abdominal wall, initial encounter: Secondary | ICD-10-CM | POA: Diagnosis present

## 2016-04-07 LAB — CBC
HCT: 38 % — ABNORMAL LOW (ref 39.0–52.0)
Hemoglobin: 12.5 g/dL — ABNORMAL LOW (ref 13.0–17.0)
MCH: 29.8 pg (ref 26.0–34.0)
MCHC: 32.9 g/dL (ref 30.0–36.0)
MCV: 90.7 fL (ref 78.0–100.0)
PLATELETS: 237 10*3/uL (ref 150–400)
RBC: 4.19 MIL/uL — ABNORMAL LOW (ref 4.22–5.81)
RDW: 12.9 % (ref 11.5–15.5)
WBC: 18.5 10*3/uL — ABNORMAL HIGH (ref 4.0–10.5)

## 2016-04-07 LAB — I-STAT CHEM 8, ED
BUN: 13 mg/dL (ref 6–20)
CALCIUM ION: 1.05 mmol/L — AB (ref 1.12–1.23)
CREATININE: 1.1 mg/dL (ref 0.61–1.24)
Chloride: 103 mmol/L (ref 101–111)
GLUCOSE: 97 mg/dL (ref 65–99)
HCT: 38 % — ABNORMAL LOW (ref 39.0–52.0)
HEMOGLOBIN: 12.9 g/dL — AB (ref 13.0–17.0)
Potassium: 3.3 mmol/L — ABNORMAL LOW (ref 3.5–5.1)
Sodium: 140 mmol/L (ref 135–145)
TCO2: 22 mmol/L (ref 0–100)

## 2016-04-07 LAB — I-STAT CG4 LACTIC ACID, ED: LACTIC ACID, VENOUS: 2.7 mmol/L — AB (ref 0.5–2.0)

## 2016-04-07 LAB — SAMPLE TO BLOOD BANK

## 2016-04-07 LAB — PROTIME-INR
INR: 1.12 (ref 0.00–1.49)
Prothrombin Time: 14.6 seconds (ref 11.6–15.2)

## 2016-04-07 MED ORDER — SODIUM CHLORIDE 0.9 % IV BOLUS (SEPSIS)
1000.0000 mL | Freq: Once | INTRAVENOUS | Status: AC
  Administered 2016-04-08: 1000 mL via INTRAVENOUS

## 2016-04-07 MED ORDER — CEFAZOLIN SODIUM 1-5 GM-% IV SOLN
1.0000 g | Freq: Once | INTRAVENOUS | Status: AC
Start: 2016-04-07 — End: 2016-04-08
  Administered 2016-04-08: 1 g via INTRAVENOUS
  Filled 2016-04-07: qty 50

## 2016-04-07 MED ORDER — TETANUS-DIPHTH-ACELL PERTUSSIS 5-2.5-18.5 LF-MCG/0.5 IM SUSP
0.5000 mL | Freq: Once | INTRAMUSCULAR | Status: AC
  Administered 2016-04-08: 0.5 mL via INTRAMUSCULAR
  Filled 2016-04-07: qty 0.5

## 2016-04-07 MED ORDER — ACETAMINOPHEN 10 MG/ML IV SOLN
1000.0000 mg | Freq: Once | INTRAVENOUS | Status: AC
  Administered 2016-04-08: 1000 mg via INTRAVENOUS
  Filled 2016-04-07: qty 100

## 2016-04-07 MED ORDER — IOPAMIDOL (ISOVUE-300) INJECTION 61%
100.0000 mL | Freq: Once | INTRAVENOUS | Status: AC | PRN
  Administered 2016-04-07: 100 mL via INTRAVENOUS
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
## 2016-04-08 ENCOUNTER — Encounter (HOSPITAL_COMMUNITY): Payer: Self-pay | Admitting: Emergency Medicine

## 2016-04-08 DIAGNOSIS — S27321A Contusion of lung, unilateral, initial encounter: Secondary | ICD-10-CM | POA: Diagnosis present

## 2016-04-08 DIAGNOSIS — S92151A Displaced avulsion fracture (chip fracture) of right talus, initial encounter for closed fracture: Secondary | ICD-10-CM | POA: Diagnosis present

## 2016-04-08 DIAGNOSIS — S2221XA Fracture of manubrium, initial encounter for closed fracture: Secondary | ICD-10-CM | POA: Diagnosis present

## 2016-04-08 DIAGNOSIS — D62 Acute posthemorrhagic anemia: Secondary | ICD-10-CM | POA: Diagnosis present

## 2016-04-08 DIAGNOSIS — S81012A Laceration without foreign body, left knee, initial encounter: Secondary | ICD-10-CM | POA: Diagnosis present

## 2016-04-08 DIAGNOSIS — F10129 Alcohol abuse with intoxication, unspecified: Secondary | ICD-10-CM | POA: Diagnosis present

## 2016-04-08 DIAGNOSIS — S301XXA Contusion of abdominal wall, initial encounter: Secondary | ICD-10-CM | POA: Diagnosis present

## 2016-04-08 DIAGNOSIS — S2241XA Multiple fractures of ribs, right side, initial encounter for closed fracture: Secondary | ICD-10-CM | POA: Diagnosis present

## 2016-04-08 DIAGNOSIS — R339 Retention of urine, unspecified: Secondary | ICD-10-CM | POA: Diagnosis not present

## 2016-04-08 LAB — CBC
HEMATOCRIT: 29.6 % — AB (ref 39.0–52.0)
HEMATOCRIT: 27.5 % — AB (ref 39.0–52.0)
HEMOGLOBIN: 9.8 g/dL — AB (ref 13.0–17.0)
HEMOGLOBIN: 9.1 g/dL — AB (ref 13.0–17.0)
MCH: 30.5 pg (ref 26.0–34.0)
MCH: 30 pg (ref 26.0–34.0)
MCHC: 33.8 g/dL (ref 30.0–36.0)
MCHC: 33.1 g/dL (ref 30.0–36.0)
MCV: 90.2 fL (ref 78.0–100.0)
MCV: 90.8 fL (ref 78.0–100.0)
PLATELETS: 210 10*3/uL (ref 150–400)
Platelets: 176 10*3/uL (ref 150–400)
RBC: 3.28 MIL/uL — AB (ref 4.22–5.81)
RBC: 3.03 MIL/uL — ABNORMAL LOW (ref 4.22–5.81)
RDW: 13 % (ref 11.5–15.5)
RDW: 13.2 % (ref 11.5–15.5)
WBC: 10.7 10*3/uL — AB (ref 4.0–10.5)
WBC: 7.3 10*3/uL (ref 4.0–10.5)

## 2016-04-08 LAB — COMPREHENSIVE METABOLIC PANEL
ALBUMIN: 3.3 g/dL — AB (ref 3.5–5.0)
ALT: 37 U/L (ref 17–63)
ALT: 34 U/L (ref 17–63)
ANION GAP: 8 (ref 5–15)
ANION GAP: 11 (ref 5–15)
AST: 48 U/L — AB (ref 15–41)
AST: 52 U/L — ABNORMAL HIGH (ref 15–41)
Albumin: 2.8 g/dL — ABNORMAL LOW (ref 3.5–5.0)
Alkaline Phosphatase: 58 U/L (ref 38–126)
Alkaline Phosphatase: 47 U/L (ref 38–126)
BUN: 11 mg/dL (ref 6–20)
BUN: 9 mg/dL (ref 6–20)
CHLORIDE: 110 mmol/L (ref 101–111)
CO2: 23 mmol/L (ref 22–32)
CO2: 17 mmol/L — ABNORMAL LOW (ref 22–32)
CREATININE: 0.74 mg/dL (ref 0.61–1.24)
Calcium: 7.9 mg/dL — ABNORMAL LOW (ref 8.9–10.3)
Calcium: 7.5 mg/dL — ABNORMAL LOW (ref 8.9–10.3)
Chloride: 107 mmol/L (ref 101–111)
Creatinine, Ser: 0.86 mg/dL (ref 0.61–1.24)
GFR calc Af Amer: >60 mL/min (ref >60)
GFR calc non Af Amer: >60 mL/min (ref >60)
GLUCOSE: 107 mg/dL — AB (ref 65–99)
Glucose, Bld: 117 mg/dL — ABNORMAL HIGH (ref 65–99)
POTASSIUM: 3.4 mmol/L — AB (ref 3.5–5.1)
POTASSIUM: 4.2 mmol/L (ref 3.5–5.1)
SODIUM: 138 mmol/L (ref 135–145)
SODIUM: 138 mmol/L (ref 135–145)
Total Bilirubin: 0.5 mg/dL (ref 0.3–1.2)
Total Bilirubin: 0.7 mg/dL (ref 0.3–1.2)
Total Protein: 5.8 g/dL — ABNORMAL LOW (ref 6.5–8.1)
Total Protein: 5 g/dL — ABNORMAL LOW (ref 6.5–8.1)

## 2016-04-08 LAB — URINALYSIS, ROUTINE W REFLEX MICROSCOPIC
Bilirubin Urine: NEGATIVE
Glucose, UA: NEGATIVE mg/dL
Hgb urine dipstick: NEGATIVE
Ketones, ur: >80 mg/dL — AB
LEUKOCYTES UA: NEGATIVE
NITRITE: NEGATIVE
PH: 5.5 (ref 5.0–8.0)
Protein, ur: NEGATIVE mg/dL

## 2016-04-08 LAB — CK TOTAL AND CKMB (NOT AT ARMC)
CK, MB: 12.4 ng/mL — AB (ref 0.5–5.0)
RELATIVE INDEX: 1.9 (ref 0.0–2.5)
Total CK: 639 U/L — ABNORMAL HIGH (ref 49–397)

## 2016-04-08 LAB — ETHANOL: Alcohol, Ethyl (B): 194 mg/dL — ABNORMAL HIGH (ref <5)

## 2016-04-08 MED ORDER — LIDOCAINE-EPINEPHRINE-TETRACAINE (LET) SOLUTION
3.0000 mL | Freq: Once | NASAL | Status: AC
  Administered 2016-04-08: 3 mL via TOPICAL
  Filled 2016-04-08: qty 3

## 2016-04-08 MED ORDER — THIAMINE HCL 100 MG/ML IJ SOLN
100.0000 mg | Freq: Every day | INTRAMUSCULAR | Status: DC
  Filled 2016-04-08: qty 2

## 2016-04-08 MED ORDER — FENTANYL CITRATE (PF) 100 MCG/2ML IJ SOLN
50.0000 ug | Freq: Once | INTRAMUSCULAR | Status: AC
  Administered 2016-04-08: 50 ug via INTRAVENOUS
  Filled 2016-04-08: qty 2

## 2016-04-08 MED ORDER — PANTOPRAZOLE SODIUM 40 MG IV SOLR
40.0000 mg | Freq: Every day | INTRAVENOUS | Status: DC
  Filled 2016-04-08: qty 40

## 2016-04-08 MED ORDER — MORPHINE SULFATE (PF) 2 MG/ML IV SOLN
1.0000 mg | INTRAVENOUS | Status: DC | PRN
  Administered 2016-04-08: 4 mg via INTRAVENOUS
  Administered 2016-04-08 (×3): 2 mg via INTRAVENOUS
  Administered 2016-04-08: 4 mg via INTRAVENOUS
  Administered 2016-04-08: 2 mg via INTRAVENOUS
  Administered 2016-04-09: 4 mg via INTRAVENOUS
  Filled 2016-04-08 (×3): qty 2
  Filled 2016-04-08 (×3): qty 1
  Filled 2016-04-08: qty 2

## 2016-04-08 MED ORDER — ONDANSETRON HCL 4 MG/2ML IJ SOLN: 4.0000 mg | Freq: Four times a day (QID) | INTRAMUSCULAR | Status: DC | PRN

## 2016-04-08 MED ORDER — LORAZEPAM 2 MG/ML IJ SOLN: 1.0000 mg | Freq: Four times a day (QID) | INTRAMUSCULAR | Status: DC | PRN

## 2016-04-08 MED ORDER — ACETAMINOPHEN 325 MG PO TABS: 650.0000 mg | ORAL_TABLET | ORAL | Status: DC | PRN

## 2016-04-08 MED ORDER — ONDANSETRON HCL 4 MG PO TABS: 4.0000 mg | ORAL_TABLET | Freq: Four times a day (QID) | ORAL | Status: DC | PRN

## 2016-04-08 MED ORDER — TAMSULOSIN HCL 0.4 MG PO CAPS
0.4000 mg | ORAL_CAPSULE | Freq: Every day | ORAL | Status: DC
  Administered 2016-04-08 – 2016-04-10 (×3): 0.4 mg via ORAL
  Filled 2016-04-08 (×3): qty 1

## 2016-04-08 MED ORDER — SODIUM CHLORIDE 0.9 % IV BOLUS (SEPSIS)
1000.0000 mL | Freq: Once | INTRAVENOUS | Status: AC
  Administered 2016-04-08: 1000 mL via INTRAVENOUS

## 2016-04-08 MED ORDER — VITAMIN B-1 100 MG PO TABS
100.0000 mg | ORAL_TABLET | Freq: Every day | ORAL | Status: DC
  Administered 2016-04-08 – 2016-04-12 (×5): 100 mg via ORAL
  Filled 2016-04-08 (×5): qty 1

## 2016-04-08 MED ORDER — ADULT MULTIVITAMIN W/MINERALS CH
1.0000 | ORAL_TABLET | Freq: Every day | ORAL | Status: DC
  Administered 2016-04-08 – 2016-04-12 (×5): 1 via ORAL
  Filled 2016-04-08 (×5): qty 1

## 2016-04-08 MED ORDER — LORAZEPAM 1 MG PO TABS
1.0000 mg | ORAL_TABLET | Freq: Four times a day (QID) | ORAL | Status: DC | PRN
  Administered 2016-04-08 – 2016-04-10 (×5): 1 mg via ORAL
  Filled 2016-04-08 (×6): qty 1

## 2016-04-08 MED ORDER — PANTOPRAZOLE SODIUM 40 MG PO TBEC
40.0000 mg | DELAYED_RELEASE_TABLET | Freq: Every day | ORAL | Status: DC
  Administered 2016-04-08 – 2016-04-10 (×3): 40 mg via ORAL
  Filled 2016-04-08 (×3): qty 1

## 2016-04-08 MED ORDER — FOLIC ACID 1 MG PO TABS
1.0000 mg | ORAL_TABLET | Freq: Every day | ORAL | Status: DC
  Administered 2016-04-08 – 2016-04-12 (×5): 1 mg via ORAL
  Filled 2016-04-08 (×5): qty 1

## 2016-04-08 MED ORDER — DOCUSATE SODIUM 100 MG PO CAPS
100.0000 mg | ORAL_CAPSULE | Freq: Two times a day (BID) | ORAL | Status: DC
  Administered 2016-04-08 – 2016-04-10 (×5): 100 mg via ORAL
  Filled 2016-04-08 (×5): qty 1

## 2016-04-08 MED ORDER — METHOCARBAMOL 750 MG PO TABS
750.0000 mg | ORAL_TABLET | Freq: Three times a day (TID) | ORAL | Status: DC
  Administered 2016-04-08 – 2016-04-12 (×13): 750 mg via ORAL
  Filled 2016-04-08: qty 2
  Filled 2016-04-08 (×2): qty 1
  Filled 2016-04-08 (×2): qty 2
  Filled 2016-04-08: qty 1
  Filled 2016-04-08 (×3): qty 2
  Filled 2016-04-08: qty 1
  Filled 2016-04-08: qty 2
  Filled 2016-04-08 (×2): qty 1

## 2016-04-08 MED ORDER — POTASSIUM CHLORIDE IN NACL 20-0.9 MEQ/L-% IV SOLN
INTRAVENOUS | Status: DC
  Administered 2016-04-08 (×2): via INTRAVENOUS
  Administered 2016-04-08: 100 mL/h via INTRAVENOUS
  Filled 2016-04-08 (×3): qty 1000

## 2016-04-08 MED ORDER — DIPHENHYDRAMINE HCL 50 MG/ML IJ SOLN
12.5000 mg | Freq: Three times a day (TID) | INTRAMUSCULAR | Status: DC | PRN
  Filled 2016-04-08: qty 0.25
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
## 2016-04-08 NOTE — ED Notes (Signed)
Ortho tech at the bedside.  

## 2016-04-08 NOTE — ED Notes (Signed)
Ortho tech notified of short leg splint order.

## 2016-04-08 NOTE — Progress Notes (Signed)
Three bladder scans performed.  Two resulted in greater than 900 ml of urine in bladder.
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
## 2016-04-09 LAB — CBC
HEMATOCRIT: 25.2 % — AB (ref 39.0–52.0)
Hemoglobin: 8.5 g/dL — ABNORMAL LOW (ref 13.0–17.0)
MCH: 30.7 pg (ref 26.0–34.0)
MCHC: 33.7 g/dL (ref 30.0–36.0)
MCV: 91 fL (ref 78.0–100.0)
Platelets: 153 10*3/uL (ref 150–400)
RBC: 2.77 MIL/uL — ABNORMAL LOW (ref 4.22–5.81)
RDW: 12.9 % (ref 11.5–15.5)
WBC: 5.5 10*3/uL (ref 4.0–10.5)

## 2016-04-09 LAB — MRSA PCR SCREENING: MRSA by PCR: POSITIVE — AB

## 2016-04-09 MED ORDER — OXYCODONE HCL 5 MG PO TABS
10.0000 mg | ORAL_TABLET | ORAL | Status: DC | PRN
  Administered 2016-04-09 (×3): 15 mg via ORAL
  Administered 2016-04-10 (×2): 10 mg via ORAL
  Administered 2016-04-10 – 2016-04-12 (×6): 15 mg via ORAL
  Administered 2016-04-12: 10 mg via ORAL
  Filled 2016-04-09 (×3): qty 3
  Filled 2016-04-09 (×2): qty 2
  Filled 2016-04-09: qty 3
  Filled 2016-04-09: qty 2
  Filled 2016-04-09 (×2): qty 3
  Filled 2016-04-09: qty 2
  Filled 2016-04-09 (×3): qty 3

## 2016-04-09 MED ORDER — MUPIROCIN 2 % EX OINT
1.0000 "application " | TOPICAL_OINTMENT | Freq: Two times a day (BID) | CUTANEOUS | Status: DC
  Administered 2016-04-09 – 2016-04-12 (×6): 1 via NASAL
  Filled 2016-04-09 (×2): qty 22

## 2016-04-09 MED ORDER — BETHANECHOL CHLORIDE 10 MG PO TABS
10.0000 mg | ORAL_TABLET | Freq: Three times a day (TID) | ORAL | Status: DC
  Administered 2016-04-09 – 2016-04-10 (×4): 10 mg via ORAL
  Filled 2016-04-09 (×4): qty 1

## 2016-04-09 MED ORDER — CHLORHEXIDINE GLUCONATE CLOTH 2 % EX PADS
6.0000 | MEDICATED_PAD | Freq: Every day | CUTANEOUS | Status: DC
  Administered 2016-04-09 – 2016-04-12 (×4): 6 via TOPICAL
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
## 2016-04-10 LAB — CBC WITH DIFFERENTIAL/PLATELET
Basophils Absolute: 0 10*3/uL (ref 0.0–0.1)
Basophils Relative: 0 %
EOS ABS: 0.1 10*3/uL (ref 0.0–0.7)
EOS PCT: 2 %
HCT: 20.8 % — ABNORMAL LOW (ref 39.0–52.0)
HEMOGLOBIN: 7.1 g/dL — AB (ref 13.0–17.0)
LYMPHS ABS: 1.3 10*3/uL (ref 0.7–4.0)
LYMPHS PCT: 26 %
MCH: 30.7 pg (ref 26.0–34.0)
MCHC: 34.1 g/dL (ref 30.0–36.0)
MCV: 90 fL (ref 78.0–100.0)
MONOS PCT: 12 %
Monocytes Absolute: 0.6 10*3/uL (ref 0.1–1.0)
NEUTROS PCT: 60 %
Neutro Abs: 3 10*3/uL (ref 1.7–7.7)
Platelets: 160 10*3/uL (ref 150–400)
RBC: 2.31 MIL/uL — AB (ref 4.22–5.81)
RDW: 12.7 % (ref 11.5–15.5)
WBC: 5.1 10*3/uL (ref 4.0–10.5)

## 2016-04-10 MED ORDER — POLYETHYLENE GLYCOL 3350 17 G PO PACK
17.0000 g | PACK | Freq: Every day | ORAL | Status: DC
  Administered 2016-04-10 – 2016-04-12 (×3): 17 g via ORAL
  Filled 2016-04-10 (×3): qty 1

## 2016-04-10 MED ORDER — DOCUSATE SODIUM 100 MG PO CAPS
200.0000 mg | ORAL_CAPSULE | Freq: Two times a day (BID) | ORAL | Status: DC
  Administered 2016-04-10 – 2016-04-12 (×4): 200 mg via ORAL
  Filled 2016-04-10 (×4): qty 2

## 2016-04-10 MED ORDER — MORPHINE SULFATE (PF) 2 MG/ML IV SOLN
2.0000 mg | INTRAVENOUS | Status: DC | PRN
  Administered 2016-04-10: 2 mg via INTRAVENOUS
  Filled 2016-04-10: qty 1

## 2016-04-10 MED ORDER — BETHANECHOL CHLORIDE 25 MG PO TABS
25.0000 mg | ORAL_TABLET | Freq: Four times a day (QID) | ORAL | Status: DC
  Administered 2016-04-10 (×3): 25 mg via ORAL
  Filled 2016-04-10 (×3): qty 1
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
## 2016-04-10 NOTE — Progress Notes (Signed)
Patient arrived to 6N from 3S. [place]. Patient stable, alert and oriented. Family at bedside.

## 2016-04-10 NOTE — Progress Notes (Signed)
Report called, pt transferring to 6N06 via w/c with belongings.

## 2016-04-10 NOTE — Clinical Social Work Note (Signed)
CSW checked in with patient per patient's request. The patient states that he is doing well at this time. He has been thinking more about alcohol rehab. [facility] His plan at this time is to return home to recover then [facility].   Roddie McBryant Suzzane Quilter MSW, [place]CS[place], [place], [phone]
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
## 2016-04-11 DIAGNOSIS — S27321A Contusion of lung, unilateral, initial encounter: Secondary | ICD-10-CM | POA: Diagnosis present

## 2016-04-11 DIAGNOSIS — D62 Acute posthemorrhagic anemia: Secondary | ICD-10-CM | POA: Diagnosis not present

## 2016-04-11 DIAGNOSIS — S301XXA Contusion of abdominal wall, initial encounter: Secondary | ICD-10-CM | POA: Diagnosis present

## 2016-04-11 DIAGNOSIS — R338 Other retention of urine: Secondary | ICD-10-CM | POA: Diagnosis not present

## 2016-04-11 DIAGNOSIS — F10929 Alcohol use, unspecified with intoxication, unspecified: Secondary | ICD-10-CM | POA: Diagnosis present

## 2016-04-11 DIAGNOSIS — S2220XA Unspecified fracture of sternum, initial encounter for closed fracture: Secondary | ICD-10-CM | POA: Diagnosis present

## 2016-04-11 DIAGNOSIS — S92101A Unspecified fracture of right talus, initial encounter for closed fracture: Secondary | ICD-10-CM | POA: Diagnosis present

## 2016-04-11 DIAGNOSIS — S2241XA Multiple fractures of ribs, right side, initial encounter for closed fracture: Secondary | ICD-10-CM | POA: Diagnosis present

## 2016-04-11 LAB — CBC
HCT: 19.3 % — ABNORMAL LOW (ref 39.0–52.0)
Hemoglobin: 6.4 g/dL — CL (ref 13.0–17.0)
MCH: 30 pg (ref 26.0–34.0)
MCHC: 33.2 g/dL (ref 30.0–36.0)
MCV: 90.6 fL (ref 78.0–100.0)
PLATELETS: 205 10*3/uL (ref 150–400)
RBC: 2.13 MIL/uL — AB (ref 4.22–5.81)
RDW: 12.7 % (ref 11.5–15.5)
WBC: 5 10*3/uL (ref 4.0–10.5)

## 2016-04-11 LAB — PREPARE RBC (CROSSMATCH)

## 2016-04-11 LAB — ABO/RH: ABO/RH(D): O POS

## 2016-04-11 MED ORDER — LORAZEPAM 1 MG PO TABS
1.0000 mg | ORAL_TABLET | Freq: Once | ORAL | Status: AC
  Administered 2016-04-11: 1 mg via ORAL
  Filled 2016-04-11: qty 1

## 2016-04-11 MED ORDER — SODIUM CHLORIDE 0.9 % IV SOLN
Freq: Once | INTRAVENOUS | Status: AC
  Administered 2016-04-11: 09:00:00 via INTRAVENOUS
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
## 2016-04-11 NOTE — Progress Notes (Signed)
CM received for alcohol abuse counseling.  This a social work referral; placed referral to CSW and alerted CSW by text.  Will sign off.    Quintella BatonJulie W. Venice Marcucci, RN, BSN  Trauma/Neuro ICU Case Manager [phone]

## 2016-04-11 NOTE — Progress Notes (Signed)
CRITICAL VALUE ALERT  Critical value received:  Hgb=6.4  Date of notification:  04-11-16  Time of notification:  0408  Critical value read back:Yes.    Nurse who received alert:  Hermine MessickJ. Render Marley   MD notified (1st page):  Dr. Sheliah HatchKinsinger  Time of first page:   0408  MD notified (2nd page): NA  Time of second page:  Responding MD:  Dr. Sheliah HatchKinsinger  Time MD responded:  [id number]
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
## 2016-04-12 ENCOUNTER — Encounter: Payer: Self-pay | Admitting: Orthopedic Surgery

## 2016-04-12 LAB — CBC
HCT: 23.9 % — ABNORMAL LOW (ref 39.0–52.0)
Hemoglobin: 8 g/dL — ABNORMAL LOW (ref 13.0–17.0)
MCH: 29.7 pg (ref 26.0–34.0)
MCHC: 33.5 g/dL (ref 30.0–36.0)
MCV: 88.8 fL (ref 78.0–100.0)
PLATELETS: 263 10*3/uL (ref 150–400)
RBC: 2.69 MIL/uL — AB (ref 4.22–5.81)
RDW: 14.2 % (ref 11.5–15.5)
WBC: 5.2 10*3/uL (ref 4.0–10.5)

## 2016-04-12 LAB — TYPE AND SCREEN
ABO/RH(D): O POS
Antibody Screen: NEGATIVE
Unit division: 0

## 2016-04-12 MED ORDER — OXYCODONE-ACETAMINOPHEN 7.5-325 MG PO TABS: 1.0000 | ORAL_TABLET | ORAL | Status: DC | PRN

## 2016-04-12 MED ORDER — METHOCARBAMOL 500 MG PO TABS: 1000.0000 mg | ORAL_TABLET | Freq: Four times a day (QID) | ORAL | Status: DC | PRN
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
## 2016-04-12 NOTE — Progress Notes (Signed)
Patient discharged to home with instructions. 

## 2016-04-12 NOTE — Care Management Note (Signed)
Case Management Note  Patient Details  Name: Alex Wilson MRN: [id number] Date of Birth: 17-Jan-1966  Subjective/Objective:   Pt medically stable for dc home with mother to provide assistance.   Pt is uninsured, but is eligible for medication assistance through [facility] program.                 Action/Plan: [facility]; DME to be delivered to pt's room prior to dc home.  MATCH letter given with explanation of program benefits.  Pt appreciative of help given.    Expected Discharge Date:    04/12/2016              Expected Discharge Plan:  Home/Self Care  In-House Referral:     Discharge planning Services  CM Consult, Medication Assistance, MATCH Program  Post Acute Care Choice:  Durable Medical Equipment Choice offered to:  Patient  DME Arranged:  3-N-1, Walker rolling DME Agency:  [facility]  HH Arranged:  NA HH Agency:  NA  Status of Service:  Completed, signed off  Medicare Important Message Given:    Date Medicare IM Given:    Medicare IM give by:    Date Additional Medicare IM Given:    Additional Medicare Important Message give by:     If discussed at Long Length of Stay Meetings, dates discussed:    Additional Comments:  Quintella BatonJulie W. Aileene Lanum, RN, BSN  Trauma/Neuro ICU Case Manager [phone]
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
## 2016-04-12 NOTE — Discharge Instructions (Signed)
No driving while taking oxycodone. ° °Wash wounds daily in shower with soap and water. °Do not soak. °Apply antibiotic ointment (e.g. Neosporin) twice daily and as needed to keep moist. °Cover with dry dressing. ° °
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
## 2016-04-15 ENCOUNTER — Encounter (HOSPITAL_COMMUNITY): Payer: Self-pay | Admitting: Psychiatry

## 2016-05-07 ENCOUNTER — Telehealth (HOSPITAL_COMMUNITY): Payer: Self-pay

## 2016-05-07 NOTE — Telephone Encounter (Signed)
Referred him to Dr. Greig RightMurphy's office for refills as he needs it for his leg.

## 2018-04-14 ENCOUNTER — Encounter (HOSPITAL_COMMUNITY): Payer: Self-pay | Admitting: Emergency Medicine

## 2018-04-14 ENCOUNTER — Emergency Department (HOSPITAL_COMMUNITY)
Admission: EM | Admit: 2018-04-14 | Discharge: 2018-04-14 | Disposition: A | Discharge status: Home / Self Care | Payer: Self-pay | Attending: Emergency Medicine | Admitting: Emergency Medicine

## 2018-04-14 ENCOUNTER — Emergency Department (HOSPITAL_COMMUNITY): Payer: Self-pay

## 2018-04-14 ENCOUNTER — Other Ambulatory Visit: Payer: Self-pay

## 2018-04-14 DIAGNOSIS — R1013 Epigastric pain: Secondary | ICD-10-CM | POA: Insufficient documentation

## 2018-04-14 DIAGNOSIS — R0789 Other chest pain: Secondary | ICD-10-CM | POA: Insufficient documentation

## 2018-04-14 DIAGNOSIS — R5383 Other fatigue: Secondary | ICD-10-CM | POA: Insufficient documentation

## 2018-04-14 DIAGNOSIS — R209 Unspecified disturbances of skin sensation: Secondary | ICD-10-CM

## 2018-04-14 DIAGNOSIS — IMO0001 Reserved for inherently not codable concepts without codable children: Secondary | ICD-10-CM

## 2018-04-14 DIAGNOSIS — Z79899 Other long term (current) drug therapy: Secondary | ICD-10-CM | POA: Insufficient documentation

## 2018-04-14 DIAGNOSIS — R202 Paresthesia of skin: Secondary | ICD-10-CM | POA: Insufficient documentation

## 2018-04-14 LAB — BASIC METABOLIC PANEL
ANION GAP: 8 (ref 5–15)
BUN: 11 mg/dL (ref 6–20)
CALCIUM: 9.3 mg/dL (ref 8.9–10.3)
CO2: 24 mmol/L (ref 22–32)
Chloride: 106 mmol/L (ref 101–111)
Creatinine, Ser: 0.78 mg/dL (ref 0.61–1.24)
Glucose, Bld: 81 mg/dL (ref 65–99)
Potassium: 4 mmol/L (ref 3.5–5.1)
Sodium: 138 mmol/L (ref 135–145)

## 2018-04-14 LAB — CBC
HCT: 42.5 % (ref 39.0–52.0)
HEMOGLOBIN: 14.6 g/dL (ref 13.0–17.0)
MCH: 29.6 pg (ref 26.0–34.0)
MCHC: 34.4 g/dL (ref 30.0–36.0)
MCV: 86.2 fL (ref 78.0–100.0)
Platelets: 235 10*3/uL (ref 150–400)
RBC: 4.93 MIL/uL (ref 4.22–5.81)
RDW: 12.6 % (ref 11.5–15.5)
WBC: 6.3 10*3/uL (ref 4.0–10.5)

## 2018-04-14 LAB — TSH: TSH: 2.016 u[IU]/mL (ref 0.350–4.500)

## 2018-04-14 LAB — I-STAT TROPONIN, ED: TROPONIN I, POC: 0.01 ng/mL (ref 0.00–0.08)

## 2018-04-14 LAB — VALPROIC ACID LEVEL
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
## 2018-04-14 NOTE — Discharge Instructions (Signed)
Your evaluated in the emergency department for some chest pain along with tingling in your hands and feet and other various symptoms.  We did some screening labs EKG and chest x-ray did not find an obvious cause of your symptoms.  You will need to follow-up with your primary care doctor for further evaluation.  Please return if any concerns.

## 2018-04-14 NOTE — ED Triage Notes (Signed)
Pt c/o chest heaviness and left arm numbness x one month. Pt states he went to urgent care a month ago and was told to come to the ER, but pt did not. Pt c/o tingling to bilateral hand and feet.

## 2018-06-30 ENCOUNTER — Emergency Department (HOSPITAL_COMMUNITY): Payer: Self-pay

## 2018-06-30 ENCOUNTER — Encounter (HOSPITAL_COMMUNITY): Payer: Self-pay

## 2018-06-30 ENCOUNTER — Emergency Department (HOSPITAL_COMMUNITY)
Admission: EM | Admit: 2018-06-30 | Discharge: 2018-06-30 | Disposition: A | Discharge status: Home / Self Care | Payer: Self-pay | Attending: Emergency Medicine | Admitting: Emergency Medicine

## 2018-06-30 ENCOUNTER — Other Ambulatory Visit: Payer: Self-pay

## 2018-06-30 DIAGNOSIS — Z79899 Other long term (current) drug therapy: Secondary | ICD-10-CM | POA: Insufficient documentation

## 2018-06-30 DIAGNOSIS — M542 Cervicalgia: Secondary | ICD-10-CM

## 2018-06-30 DIAGNOSIS — M25512 Pain in left shoulder: Secondary | ICD-10-CM
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
## 2018-06-30 NOTE — ED Triage Notes (Signed)
Pt has an abscess on left shoulder as well as heart burn and indigestion. Heart burn and indigestion for 1 week. Has not taken any antacids for this pain. NAD.

## 2018-06-30 NOTE — Discharge Instructions (Addendum)
X-rays show no broken bones.  Ice pack.  Tylenol or ibuprofen for pain.  Follow-up with orthopedics.  Phone number given.

## 2019-11-29 ENCOUNTER — Telehealth: Payer: Self-pay | Admitting: Physician Assistant

## 2019-11-30 ENCOUNTER — Ambulatory Visit (INDEPENDENT_AMBULATORY_CARE_PROVIDER_SITE_OTHER): Payer: 59 | Admitting: Physician Assistant

## 2019-11-30 ENCOUNTER — Other Ambulatory Visit: Payer: Self-pay

## 2019-11-30 ENCOUNTER — Encounter: Payer: Self-pay | Admitting: Physician Assistant

## 2019-11-30 VITALS — BP 123/74 | HR 95 | Temp 98.2°F | Ht 66.0 in | Wt 181.0 lb

## 2019-11-30 DIAGNOSIS — K219 Gastro-esophageal reflux disease without esophagitis: Secondary | ICD-10-CM | POA: Diagnosis not present

## 2019-11-30 DIAGNOSIS — G8929 Other chronic pain: Secondary | ICD-10-CM

## 2019-11-30 DIAGNOSIS — F419 Anxiety disorder, unspecified: Secondary | ICD-10-CM

## 2019-11-30 DIAGNOSIS — M25512 Pain in left shoulder: Secondary | ICD-10-CM

## 2019-11-30 DIAGNOSIS — F1021 Alcohol dependence, in remission: Secondary | ICD-10-CM | POA: Diagnosis not present

## 2019-11-30 DIAGNOSIS — F1991 Other psychoactive substance use, unspecified, in remission: Secondary | ICD-10-CM

## 2019-11-30 DIAGNOSIS — Z87898 Personal history of other specified conditions: Secondary | ICD-10-CM | POA: Diagnosis not present

## 2019-11-30 DIAGNOSIS — Z0000 Encounter for general adult medical examination without abnormal findings: Secondary | ICD-10-CM | POA: Diagnosis not present

## 2019-12-01 ENCOUNTER — Encounter: Payer: Self-pay | Admitting: Physician Assistant

## 2019-12-01 LAB — CMP14+EGFR
ALT: 17 IU/L (ref 0–44)
AST: 19 IU/L (ref 0–40)
Albumin/Globulin Ratio: 1.9 (ref 1.2–2.2)
Albumin: 5 g/dL — ABNORMAL HIGH (ref 3.8–4.9)
Alkaline Phosphatase: 68 IU/L (ref 39–117)
BUN/Creatinine Ratio: 22 — ABNORMAL HIGH (ref 9–20)
BUN: 20 mg/dL (ref 6–24)
Bilirubin Total: 0.6 mg/dL (ref 0.0–1.2)
CO2: 22 mmol/L (ref 20–29)
Calcium: 10.2 mg/dL (ref 8.7–10.2)
Chloride: 100 mmol/L (ref 96–106)
Creatinine, Ser: 0.89 mg/dL (ref 0.76–1.27)
GFR calc Af Amer: 114 mL/min/{1.73_m2} (ref >59)
GFR calc non Af Amer: 98 mL/min/{1.73_m2} (ref >59)
Globulin, Total: 2.6 g/dL (ref 1.5–4.5)
Glucose: 87 mg/dL (ref 65–99)
Potassium: 5.4 mmol/L — ABNORMAL HIGH (ref 3.5–5.2)
Sodium: 137 mmol/L (ref 134–144)
Total Protein: 7.6 g/dL (ref 6.0–8.5)

## 2019-12-01 LAB — CBC WITH DIFFERENTIAL/PLATELET
Basophils Absolute: 0.1 10*3/uL (ref 0.0–0.2)
Basos: 1 %
EOS (ABSOLUTE): 0.1 10*3/uL (ref 0.0–0.4)
Eos: 2 %
Hematocrit: 47.1 % (ref 37.5–51.0)
Hemoglobin: 16 g/dL (ref 13.0–17.7)
Immature Grans (Abs): 0 10*3/uL (ref 0.0–0.1)
Immature Granulocytes: 0 %
Lymphocytes Absolute: 1.6 10*3/uL (ref 0.7–3.1)
Lymphs: 28 %
MCH: 30.6 pg (ref 26.6–33.0)
MCHC: 34 g/dL (ref 31.5–35.7)
MCV: 90 fL (ref 79–97)
Monocytes Absolute: 0.4 10*3/uL (ref 0.1–0.9)
Monocytes: 8 %
Neutrophils Absolute: 3.4 10*3/uL (ref 1.4–7.0)
Neutrophils: 61 %
Platelets: 261 10*3/uL (ref 150–450)
RBC: 5.23 x10E6/uL (ref 4.14–5.80)
RDW: 11.9 % (ref 11.6–15.4)
WBC: 5.6 10*3/uL (ref 3.4–10.8)

## 2019-12-01 LAB — LIPID PANEL
Chol/HDL Ratio: 3.5 ratio (ref 0.0–5.0)
Cholesterol, Total: 174 mg/dL (ref 100–199)
HDL: 50 mg/dL (ref >39)
LDL Chol Calc (NIH): 114 mg/dL — ABNORMAL HIGH (ref 0–99)
Triglycerides: 48 mg/dL (ref 0–149)
VLDL Cholesterol Cal: 10 mg/dL (ref 5–40)

## 2019-12-01 LAB — PSA: Prostate Specific Ag, Serum: 0.4 ng/mL (ref 0.0–4.0)

## 2019-12-01 LAB — TSH: TSH: 1.93 u[IU]/mL (ref 0.450–4.500)

## 2019-12-02 ENCOUNTER — Telehealth: Payer: Self-pay | Admitting: Physician Assistant

## 2019-12-02 ENCOUNTER — Encounter: Payer: Self-pay | Admitting: Physician Assistant

## 2019-12-02 DIAGNOSIS — F419 Anxiety disorder, unspecified: Secondary | ICD-10-CM | POA: Insufficient documentation

## 2019-12-02 NOTE — Telephone Encounter (Signed)
Patient called and is requesting to speak with Lawanna Kobus or nurse regarding his lab results.

## 2019-12-02 NOTE — Progress Notes (Signed)
Subjective:    Patient ID: Alex Wilson, male    DOB: 12-Dec-1965, 54 y.o.   MRN: [id number]  Chief Complaint  Patient presents with  . New Patient (Initial Visit)    Drug Problem The patient's primary symptoms include intoxication. Pertinent negatives include no weakness. This is a chronic problem. The problem has been resolved since onset. Suspected agents include alcohol and cocaine. Associated symptoms include an injury. Pertinent negatives include no nausea or vomiting. Past treatments include nothing. The treatment provided significant relief. There is no history of a withdrawal syndrome.  Anxiety Presents for follow-up visit. Symptoms include depressed mood and nervous/anxious behavior. Patient reports no chest pain, nausea, palpitations or shortness of breath. Symptoms occur occasionally.    Gastroesophageal Reflux He complains of abdominal pain, choking, dysphagia and heartburn. He reports no chest pain, no coughing, no nausea or no wheezing. This is a chronic problem. The current episode started more than 1 year ago. Pertinent negatives include no fatigue.  Shoulder Pain  The pain is present in the left shoulder. This is a chronic problem. The current episode started more than 1 year ago. The problem occurs constantly. The problem has been waxing and waning. The quality of the pain is described as aching. Pertinent negatives include no numbness. The symptoms are aggravated by cold and activity. The treatment provided mild relief.   Depression screen PHQ 2/9 11/30/2019  Decreased Interest 0  Down, Depressed, Hopeless 0  PHQ - 2 Score 0        Past Medical History:  Diagnosis Date  . Anxiety   . Difficulty controlling anger     Past Surgical History:  Procedure Laterality Date  . FINGER SURGERY    . KNEE SURGERY      Family History  Problem Relation Age of Onset  . Hypertension Father   . Diabetes Father   . Stroke Father     Social History    Socioeconomic History  . Marital status: Divorced    Spouse name: Not on file  . Number of children: Not on file  . Years of education: Not on file  . Highest education level: Not on file  Occupational History  . Not on file  Tobacco Use  . Smoking status: Never Smoker  . Smokeless tobacco: Never Used  Substance and Sexual Activity  . Alcohol use: Not Currently    Alcohol/week: 12.0 standard drinks    Types: 12 Cans of beer per week    Comment: most days  . Drug use: Not Currently    Types: Cocaine  . Sexual activity: Not on file  Other Topics Concern  . Not on file  Social History Narrative   ** Merged History Encounter **       Social Determinants of Health   Financial Resource Strain:   . Difficulty of Paying Living Expenses: Not on file  Food Insecurity:   . Worried About Charity fundraiser in the Last Year: Not on file  . Ran Out of Food in the Last Year: Not on file  Transportation Needs:   . Lack of Transportation (Medical): Not on file  . Lack of Transportation (Non-Medical): Not on file  Physical Activity:   . Days of Exercise per Week: Not on file  . Minutes of Exercise per Session: Not on file  Stress:   . Feeling of Stress : Not on file  Social Connections:   . Frequency of Communication with Friends and Family:  Not on file  . Frequency of Social Gatherings with Friends and Family: Not on file  . Attends Religious Services: Not on file  . Active Member of Clubs or Organizations: Not on file  . Attends Archivist Meetings: Not on file  . Marital Status: Not on file  Intimate Partner Violence:   . Fear of Current or Ex-Partner: Not on file  . Emotionally Abused: Not on file  . Physically Abused: Not on file  . Sexually Abused: Not on file    Outpatient Medications Prior to Visit  Medication Sig Dispense Refill  . busPIRone (BUSPAR) 10 MG tablet Take 1 tablet (10 mg total) by mouth 2 (two) times daily. For anxiety 60 tablet 0  .  diazepam (VALIUM) 2 MG tablet Take 1 tablet (2 mg total) by mouth every 12 (twelve) hours as needed for anxiety. 10 tablet 0  . divalproex (DEPAKOTE ER) 500 MG 24 hr tablet Take 2 tablets (1,000 mg total) by mouth daily. For mood stabilization 60 tablet 0  . methocarbamol (ROBAXIN) 500 MG tablet Take 2 tablets (1,000 mg total) by mouth every 6 (six) hours as needed for muscle spasms. 100 tablet 0  . oxyCODONE-acetaminophen (PERCOCET) 7.5-325 MG tablet Take 1-2 tablets by mouth every 4 (four) hours as needed. 168 tablet 0  . topiramate (TOPAMAX) 100 MG tablet Take 1 tablet (100 mg total) by mouth at bedtime. For mood stabilization 30 tablet 0  . traZODone (DESYREL) 50 MG tablet Take 1 tablet (50 mg total) by mouth at bedtime as needed for sleep. 30 tablet 0   No facility-administered medications prior to visit.    No Known Allergies  Review of Systems  Constitutional: Negative.  Negative for appetite change and fatigue.  Eyes: Negative for pain and visual disturbance.  Respiratory: Positive for choking. Negative for cough, chest tightness, shortness of breath and wheezing.   Cardiovascular: Negative.  Negative for chest pain, palpitations and leg swelling.  Gastrointestinal: Positive for abdominal pain, dysphagia and heartburn. Negative for diarrhea, nausea and vomiting.  Genitourinary: Negative.   Skin: Negative.  Negative for color change and rash.  Neurological: Negative.  Negative for weakness, numbness and headaches.  Psychiatric/Behavioral: The patient is nervous/anxious.        Objective:    Physical Exam Vitals and nursing note reviewed.  Constitutional:      General: He is not in acute distress.    Appearance: He is well-developed.  HENT:     Head: Normocephalic and atraumatic.  Eyes:     Conjunctiva/sclera: Conjunctivae normal.     Pupils: Pupils are equal, round, and reactive to light.  Cardiovascular:     Rate and Rhythm: Normal rate and regular rhythm.     Heart  sounds: Normal heart sounds.  Pulmonary:     Effort: Pulmonary effort is normal. No respiratory distress.     Breath sounds: Normal breath sounds.  Skin:    General: Skin is warm and dry.  Psychiatric:        Behavior: Behavior normal.     BP 123/74   Pulse 95   Temp 98.2 F (36.8 C) (Temporal)   Ht 5' 6"  (1.676 m)   Wt 181 lb (82.1 kg)   BMI 29.21 kg/m  Wt Readings from Last 3 Encounters:  11/30/19 181 lb (82.1 kg)  06/30/18 185 lb (83.9 kg)  04/14/18 188 lb (85.3 kg)    Health Maintenance Due  Topic Date Due  . HIV Screening  08/06/1982  .  COLONOSCOPY  08/06/2017    There are no preventive care reminders to display for this patient.   Lab Results  Component Value Date   TSH 1.930 11/30/2019   Lab Results  Component Value Date   WBC 5.6 11/30/2019   HGB 16.0 11/30/2019   HCT 47.1 11/30/2019   MCV 90 11/30/2019   PLT 261 11/30/2019   Lab Results  Component Value Date   NA 137 11/30/2019   K 5.4 (H) 11/30/2019   CO2 22 11/30/2019   GLUCOSE 87 11/30/2019   BUN 20 11/30/2019   CREATININE 0.89 11/30/2019   BILITOT 0.6 11/30/2019   ALKPHOS 68 11/30/2019   AST 19 11/30/2019   ALT 17 11/30/2019   PROT 7.6 11/30/2019   ALBUMIN 5.0 (H) 11/30/2019   CALCIUM 10.2 11/30/2019   ANIONGAP 8 04/14/2018   Lab Results  Component Value Date   CHOL 174 11/30/2019   Lab Results  Component Value Date   HDL 50 11/30/2019   Lab Results  Component Value Date   LDLCALC 114 (H) 11/30/2019   Lab Results  Component Value Date   TRIG 48 11/30/2019   Lab Results  Component Value Date   CHOLHDL 3.5 11/30/2019   No results found for: HGBA1C     Assessment & Plan:   Problem List Items Addressed This Visit      Digestive   Gastroesophageal reflux disease without esophagitis     Other   History of drug use   Alcoholism in recovery ([place])   Anxiety    Other Visit Diagnoses    Well adult exam    -  Primary   Relevant Orders   CBC with  Differential/Platelet (Completed)   CMP14+EGFR (Completed)   Lipid panel (Completed)   TSH (Completed)   PSA (Completed)       No orders of the defined types were placed in this encounter.    Terald Sleeper, PA-C

## 2019-12-03 ENCOUNTER — Other Ambulatory Visit: Payer: Self-pay | Admitting: Physician Assistant

## 2019-12-03 DIAGNOSIS — E875 Hyperkalemia: Secondary | ICD-10-CM

## 2019-12-03 NOTE — Telephone Encounter (Signed)
Left message to call back  

## 2019-12-03 NOTE — Telephone Encounter (Signed)
Resulted in EPIC

## 2019-12-06 ENCOUNTER — Encounter: Payer: Self-pay | Admitting: Gastroenterology

## 2019-12-09 ENCOUNTER — Encounter: Payer: Self-pay | Admitting: Physician Assistant

## 2019-12-31 ENCOUNTER — Ambulatory Visit: Payer: Self-pay | Admitting: Gastroenterology

## 2020-01-07 ENCOUNTER — Encounter: Payer: Self-pay | Admitting: Gastroenterology

## 2020-01-07 ENCOUNTER — Other Ambulatory Visit: Payer: Self-pay

## 2020-01-07 ENCOUNTER — Ambulatory Visit (INDEPENDENT_AMBULATORY_CARE_PROVIDER_SITE_OTHER): Payer: Self-pay | Admitting: Gastroenterology

## 2020-01-07 VITALS — BP 129/87 | HR 67 | Temp 97.3°F | Ht 66.0 in | Wt 182.4 lb

## 2020-01-07 DIAGNOSIS — K219 Gastro-esophageal reflux disease without esophagitis: Secondary | ICD-10-CM | POA: Insufficient documentation

## 2020-01-07 DIAGNOSIS — Z1211 Encounter for screening for malignant neoplasm of colon: Secondary | ICD-10-CM | POA: Insufficient documentation

## 2020-01-07 DIAGNOSIS — Z0000 Encounter for general adult medical examination without abnormal findings: Secondary | ICD-10-CM | POA: Insufficient documentation

## 2020-01-07 DIAGNOSIS — Z1159 Encounter for screening for other viral diseases: Secondary | ICD-10-CM

## 2020-01-07 MED ORDER — PANTOPRAZOLE SODIUM 40 MG PO TBEC: 40.0000 mg | DELAYED_RELEASE_TABLET | Freq: Every day | ORAL | 3 refills | Status: DC
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
## 2020-01-07 NOTE — Patient Instructions (Addendum)
We are arranging a colonoscopy and upper endoscopy in the near future.  I sent in Protonix to take once each morning, 30 minutes before breakfast. This is for reflux.  Please review the reflux diet handout.  I have also ordered the screening Hep C test just to have this on file.  We will see you in 6 months!  I enjoyed seeing you again today! As you know, I value our relationship and want to provide genuine, compassionate, and quality care. I welcome your feedback. If you receive a survey regarding your visit,  I greatly appreciate you taking time to fill this out. See you next time!  Annitta Needs, PhD, ANP-BC [facility]   Gastroesophageal Reflux Disease, Adult Gastroesophageal reflux (GER) happens when acid from the stomach flows up into the tube that connects the mouth and the stomach (esophagus). Normally, food travels down the esophagus and stays in the stomach to be digested. However, when a person has GER, food and stomach acid sometimes move back up into the esophagus. If this becomes a more serious problem, the person may be diagnosed with a disease called gastroesophageal reflux disease (GERD). GERD occurs when the reflux:  Happens often.  Causes frequent or severe symptoms.  Causes problems such as damage to the esophagus. When stomach acid comes in contact with the esophagus, the acid may cause soreness (inflammation) in the esophagus. Over time, GERD may create small holes (ulcers) in the lining of the esophagus. What are the causes? This condition is caused by a problem with the muscle between the esophagus and the stomach (lower esophageal sphincter, or LES). Normally, the LES muscle closes after food passes through the esophagus to the stomach. When the LES is weakened or abnormal, it does not close properly, and that allows food and stomach acid to go back up into the esophagus. The LES can be weakened by certain dietary substances, medicines, and medical  conditions, including:  Tobacco use.  Pregnancy.  Having a hiatal hernia.  Alcohol use.  Certain foods and beverages, such as coffee, chocolate, onions, and peppermint. What increases the risk? You are more likely to develop this condition if you:  Have an increased body weight.  Have a connective tissue disorder.  Use NSAID medicines. What are the signs or symptoms? Symptoms of this condition include:  Heartburn.  Difficult or painful swallowing.  The feeling of having a lump in the throat.  Abitter taste in the mouth.  Bad breath.  Having a large amount of saliva.  Having an upset or bloated stomach.  Belching.  Chest pain. Different conditions can cause chest pain. Make sure you see your health care provider if you experience chest pain.  Shortness of breath or wheezing.  Ongoing (chronic) cough or a night-time cough.  Wearing away of tooth enamel.  Weight loss. How is this diagnosed? Your health care provider will take a medical history and perform a physical exam. To determine if you have mild or severe GERD, your health care provider may also monitor how you respond to treatment. You may also have tests, including:  A test to examine your stomach and esophagus with a small camera (endoscopy).  A test thatmeasures the acidity level in your esophagus.  A test thatmeasures how much pressure is on your esophagus.  A barium swallow or modified barium swallow test to show the shape, size, and functioning of your esophagus. How is this treated? The goal of treatment is to help relieve your symptoms and  to prevent complications. Treatment for this condition may vary depending on how severe your symptoms are. Your health care provider may recommend:  Changes to your diet.  Medicine.  Surgery. Follow these instructions at home: Eating and drinking   Follow a diet as recommended by your health care provider. This may involve avoiding foods and drinks  such as: ? Coffee and tea (with or without caffeine). ? Drinks that containalcohol. ? Energy drinks and sports drinks. ? Carbonated drinks or sodas. ? Chocolate and cocoa. ? Peppermint and mint flavorings. ? Garlic and onions. ? Horseradish. ? Spicy and acidic foods, including peppers, chili powder, curry powder, vinegar, hot sauces, and barbecue sauce. ? Citrus fruit juices and citrus fruits, such as oranges, lemons, and limes. ? Tomato-based foods, such as red sauce, chili, salsa, and pizza with red sauce. ? Fried and fatty foods, such as donuts, french fries, potato chips, and high-fat dressings. ? High-fat meats, such as hot dogs and fatty cuts of red and white meats, such as rib eye steak, sausage, ham, and bacon. ? High-fat dairy items, such as whole milk, butter, and cream cheese.  Eat small, frequent meals instead of large meals.  Avoid drinking large amounts of liquid with your meals.  Avoid eating meals during the 2-3 hours before bedtime.  Avoid lying down right after you eat.  Do not exercise right after you eat. Lifestyle   Do not use any products that contain nicotine or tobacco, such as cigarettes, e-cigarettes, and chewing tobacco. If you need help quitting, ask your health care provider.  Try to reduce your stress by using methods such as yoga or meditation. If you need help reducing stress, ask your health care provider.  If you are overweight, reduce your weight to an amount that is healthy for you. Ask your health care provider for guidance about a safe weight loss goal. General instructions  Pay attention to any changes in your symptoms.  Take over-the-counter and prescription medicines only as told by your health care provider. Do not take aspirin, ibuprofen, or other NSAIDs unless your health care provider told you to do so.  Wear loose-fitting clothing. Do not wear anything tight around your waist that causes pressure on your abdomen.  Raise (elevate)  the head of your bed about 6 inches (15 cm).  Avoid bending over if this makes your symptoms worse.  Keep all follow-up visits as told by your health care provider. This is important. Contact a health care provider if:  You have: ? New symptoms. ? Unexplained weight loss. ? Difficulty swallowing or it hurts to swallow. ? Wheezing or a persistent cough. ? A hoarse voice.  Your symptoms do not improve with treatment. Get help right away if you:  Have pain in your arms, neck, jaw, teeth, or back.  Feel sweaty, dizzy, or light-headed.  Have chest pain or shortness of breath.  Vomit and your vomit looks like blood or coffee grounds.  Faint.  Have stool that is bloody or black.  Cannot swallow, drink, or eat. Summary  Gastroesophageal reflux happens when acid from the stomach flows up into the esophagus. GERD is a disease in which the reflux happens often, causes frequent or severe symptoms, or causes problems such as damage to the esophagus.  Treatment for this condition may vary depending on how severe your symptoms are. Your health care provider may recommend diet and lifestyle changes, medicine, or surgery.  Contact a health care provider if you have new or  worsening symptoms.  Take over-the-counter and prescription medicines only as told by your health care provider. Do not take aspirin, ibuprofen, or other NSAIDs unless your health care provider told you to do so.  Keep all follow-up visits as told by your health care provider. This is important. This information is not intended to replace advice given to you by your health care provider. Make sure you discuss any questions you have with your health care provider. Document Revised: 05/06/2018 Document Reviewed: 05/06/2018 Elsevier Patient Education  2020 [employer].

## 2020-01-07 NOTE — Progress Notes (Signed)
Cc'ed to pcp °

## 2020-01-10 ENCOUNTER — Other Ambulatory Visit: Payer: Self-pay

## 2020-01-10 ENCOUNTER — Encounter: Payer: Self-pay | Admitting: *Deleted

## 2020-01-10 ENCOUNTER — Telehealth: Payer: Self-pay

## 2020-01-10 DIAGNOSIS — M25512 Pain in left shoulder: Secondary | ICD-10-CM | POA: Insufficient documentation

## 2020-01-10 LAB — HEPATITIS C ANTIBODY
Hepatitis C Ab: NONREACTIVE
SIGNAL TO CUT-OFF: 0.01 (ref <1.00)

## 2020-01-10 NOTE — Telephone Encounter (Signed)
Attempted to submit PA for TCS/EGD via [facility] website. Per [employer]: Member Plan covers preventative services/benefits only!

## 2020-01-24 ENCOUNTER — Encounter: Payer: Self-pay | Admitting: Family

## 2020-01-24 ENCOUNTER — Telehealth (INDEPENDENT_AMBULATORY_CARE_PROVIDER_SITE_OTHER): Payer: 59 | Admitting: Family

## 2020-01-24 ENCOUNTER — Other Ambulatory Visit: Payer: Self-pay | Admitting: Family

## 2020-01-24 DIAGNOSIS — R109 Unspecified abdominal pain: Secondary | ICD-10-CM | POA: Diagnosis not present

## 2020-01-24 LAB — URINALYSIS
Bilirubin, UA: NEGATIVE
Glucose, UA: NEGATIVE
Ketones, UA: NEGATIVE
Leukocytes,UA: NEGATIVE
Nitrite, UA: NEGATIVE
Protein,UA: NEGATIVE
RBC, UA: NEGATIVE
Specific Gravity, UA: 1.025 (ref 1.005–1.030)
Urobilinogen, Ur: 0.2 mg/dL (ref 0.2–1.0)
pH, UA: 7 (ref 5.0–7.5)

## 2020-01-24 MED ORDER — DICLOFENAC SODIUM 75 MG PO TBEC: 75.0000 mg | DELAYED_RELEASE_TABLET | Freq: Two times a day (BID) | ORAL | 0 refills | Status: DC

## 2020-01-24 MED ORDER — BACLOFEN 10 MG PO TABS: 10.0000 mg | ORAL_TABLET | Freq: Three times a day (TID) | ORAL | 0 refills | Status: DC

## 2020-01-24 NOTE — Progress Notes (Signed)
   Virtual Visit via telephone Note Due to COVID-19 pandemic this visit was conducted virtually. This visit type was conducted due to national recommendations for restrictions regarding the COVID-19 Pandemic (e.g. social distancing, sheltering in place) in an effort to limit this patient's exposure and mitigate transmission in our community. All issues noted in this document were discussed and addressed.  A physical exam was not performed with this format.  I connected with Alex Wilson on 01/24/20 at 11:10 AM  by telephone and video and verified that I am speaking with the correct person using two identifiers. Alex Wilson is currently located at home and no one is currently with him during visit. The provider, Jannifer Rodney, FNP is located in their office at time of visit.  I discussed the limitations, risks, security and privacy concerns of performing an evaluation and management service by telephone and the availability of in person appointments. I also discussed with the patient that there may be a patient responsible charge related to this service. The patient expressed understanding and agreed to proceed.   History and Present Illness:  Flank Pain This is a new problem. The current episode started in the past 7 days. The problem occurs constantly. The problem is unchanged. The quality of the pain is described as aching. The pain does not radiate. The pain is at a severity of 5/10. The pain is mild. The symptoms are aggravated by bending. Pertinent negatives include no bladder incontinence, bowel incontinence, dysuria, fever, leg pain or numbness. (Denies any hematuria)      Review of Systems  Constitutional: Negative for fever.  Gastrointestinal: Negative for bowel incontinence.  Genitourinary: Positive for flank pain. Negative for bladder incontinence and dysuria.  Neurological: Negative for numbness.     Observations/Objective: No SOB or distress noted  Assessment and Plan: 1.  Flank pain PT will come and leave urine to rule out UTI VS stone. If urine is negative then we will treat as MSK and given NSAIDs and baclofen Rest Force fluids Call if symptoms worsen or do not improve  - Urinalysis - Urine Culture     I discussed the assessment and treatment plan with the patient. The patient was provided an opportunity to ask questions and all were answered. The patient agreed with the plan and demonstrated an understanding of the instructions.   The patient was advised to call back or seek an in-person evaluation if the symptoms worsen or if the condition fails to improve as anticipated.  The above assessment and management plan was discussed with the patient. The patient verbalized understanding of and has agreed to the management plan. Patient is aware to call the clinic if symptoms persist or worsen. Patient is aware when to return to the clinic for a follow-up visit. Patient educated on when it is appropriate to go to the emergency department.   Time call ended:  11: 30  I provided 20 minutes of -face-to-face time during this encounter.    Jannifer Rodney, FNP

## 2020-01-25 LAB — URINE CULTURE: Organism ID, Bacteria: NO GROWTH

## 2020-02-14 ENCOUNTER — Other Ambulatory Visit: Payer: Self-pay | Admitting: Family

## 2020-02-29 ENCOUNTER — Other Ambulatory Visit: Payer: Self-pay | Admitting: Family Medicine

## 2020-02-29 ENCOUNTER — Other Ambulatory Visit: Payer: Self-pay | Admitting: Orthopedic Surgery

## 2020-02-29 DIAGNOSIS — M19012 Primary osteoarthritis, left shoulder: Secondary | ICD-10-CM

## 2020-03-01 ENCOUNTER — Other Ambulatory Visit: Payer: Self-pay | Admitting: *Deleted

## 2020-03-01 ENCOUNTER — Telehealth: Payer: Self-pay | Admitting: Gastroenterology

## 2020-03-01 ENCOUNTER — Ambulatory Visit: Payer: 59 | Admitting: Family Medicine

## 2020-03-01 ENCOUNTER — Ambulatory Visit: Payer: 59 | Admitting: Physician Assistant

## 2020-03-01 ENCOUNTER — Telehealth: Payer: Self-pay | Admitting: Family Medicine

## 2020-03-01 MED ORDER — DICLOFENAC SODIUM 75 MG PO TBEC: 75.0000 mg | DELAYED_RELEASE_TABLET | Freq: Two times a day (BID) | ORAL | 0 refills | Status: DC

## 2020-03-01 NOTE — Telephone Encounter (Signed)
Yes, however since he's been billed the billing department has to file with the insurance company.

## 2020-03-01 NOTE — Telephone Encounter (Signed)
Patient called and said that he received a bill that stated he has no insurance.  Patient states he has insurance and did at time of appointment.   I could not see insurance attached to his last visit.  Told him to call billing, is there a way for [place] to go back and attach the insurance?

## 2020-03-01 NOTE — Telephone Encounter (Signed)
  Prescription Request  03/01/2020  What is the name of the medication or equipment? Voltaren  Have you contacted your pharmacy to request a refill? (if applicable) Yes  Which pharmacy would you like this sent to? [facility]   Patient notified that their request is being sent to the clinical staff for review and that they should receive a response within 2 business days.   [facility] pt.  He takes twice per day & wants one month supply (60 pills=30 x 2)

## 2020-03-01 NOTE — Telephone Encounter (Signed)
Rx sent to pharmacy   

## 2020-03-15 ENCOUNTER — Other Ambulatory Visit: Payer: Self-pay

## 2020-03-15 ENCOUNTER — Ambulatory Visit
Admission: RE | Admit: 2020-03-15 | Discharge: 2020-03-15 | Disposition: A | Discharge status: Home / Self Care | Payer: 59 | Source: Ambulatory Visit | Attending: Orthopedic Surgery | Admitting: Orthopedic Surgery

## 2020-03-15 DIAGNOSIS — M19012 Primary osteoarthritis, left shoulder: Secondary | ICD-10-CM

## 2020-03-27 ENCOUNTER — Other Ambulatory Visit (HOSPITAL_COMMUNITY): Payer: MEDICAID

## 2020-03-27 ENCOUNTER — Encounter (HOSPITAL_COMMUNITY)
Admission: RE | Admit: 2020-03-27 | Discharge: 2020-03-27 | Disposition: A | Discharge status: Home / Self Care | Payer: 59 | Source: Ambulatory Visit | Attending: Internal Medicine | Admitting: Internal Medicine

## 2020-03-27 ENCOUNTER — Other Ambulatory Visit: Payer: Self-pay

## 2020-03-27 ENCOUNTER — Other Ambulatory Visit (HOSPITAL_COMMUNITY)
Admission: RE | Admit: 2020-03-27 | Discharge: 2020-03-27 | Disposition: A | Discharge status: Home / Self Care | Payer: 59 | Source: Ambulatory Visit | Attending: Internal Medicine | Admitting: Internal Medicine

## 2020-03-27 DIAGNOSIS — Z01812 Encounter for preprocedural laboratory examination: Secondary | ICD-10-CM | POA: Diagnosis not present

## 2020-03-27 DIAGNOSIS — Z20822 Contact with and (suspected) exposure to covid-19: Secondary | ICD-10-CM | POA: Diagnosis not present

## 2020-03-27 LAB — SARS CORONAVIRUS 2 (TAT 6-24 HRS): SARS Coronavirus 2: NEGATIVE

## 2020-03-30 ENCOUNTER — Encounter (HOSPITAL_COMMUNITY)
Admission: RE | Disposition: A | Discharge status: Home / Self Care | Payer: Self-pay | Source: Home / Self Care | Attending: Internal Medicine

## 2020-03-30 ENCOUNTER — Ambulatory Visit (HOSPITAL_COMMUNITY)
Admission: RE | Admit: 2020-03-30 | Discharge: 2020-03-30 | Disposition: A | Discharge status: Home / Self Care | Payer: 59 | Attending: Internal Medicine | Admitting: Internal Medicine

## 2020-03-30 ENCOUNTER — Other Ambulatory Visit: Payer: Self-pay

## 2020-03-30 ENCOUNTER — Encounter (HOSPITAL_COMMUNITY): Payer: Self-pay | Admitting: Internal Medicine

## 2020-03-30 ENCOUNTER — Ambulatory Visit (HOSPITAL_COMMUNITY): Payer: 59 | Admitting: Anesthesiology

## 2020-03-30 DIAGNOSIS — Z79899 Other long term (current) drug therapy: Secondary | ICD-10-CM | POA: Insufficient documentation

## 2020-03-30 DIAGNOSIS — F419 Anxiety disorder, unspecified: Secondary | ICD-10-CM | POA: Diagnosis not present

## 2020-03-30 DIAGNOSIS — Z1211 Encounter for screening for malignant neoplasm of colon: Secondary | ICD-10-CM | POA: Insufficient documentation

## 2020-03-30 DIAGNOSIS — K5730 Diverticulosis of large intestine without perforation or abscess without bleeding: Secondary | ICD-10-CM | POA: Diagnosis not present

## 2020-03-30 DIAGNOSIS — K219 Gastro-esophageal reflux disease without esophagitis: Secondary | ICD-10-CM | POA: Diagnosis present

## 2020-03-30 HISTORY — PX: COLONOSCOPY WITH PROPOFOL: SHX5780

## 2020-03-30 HISTORY — PX: ESOPHAGOGASTRODUODENOSCOPY (EGD) WITH PROPOFOL: SHX5813

## 2020-03-30 SURGERY — COLONOSCOPY WITH PROPOFOL
Anesthesia: General

## 2020-03-30 MED ORDER — KETAMINE HCL 50 MG/5ML IJ SOSY
PREFILLED_SYRINGE | INTRAMUSCULAR | Status: AC
  Filled 2020-03-30: qty 5

## 2020-03-30 MED ORDER — GLYCOPYRROLATE 0.2 MG/ML IJ SOLN
INTRAMUSCULAR | Status: DC | PRN
  Administered 2020-03-30: .2 mg via INTRAVENOUS

## 2020-03-30 MED ORDER — LIDOCAINE VISCOUS HCL 2 % MT SOLN
15.0000 mL | Freq: Once | OROMUCOSAL | Status: AC
  Administered 2020-03-30: 6 mL via OROMUCOSAL

## 2020-03-30 MED ORDER — LIDOCAINE VISCOUS HCL 2 % MT SOLN
OROMUCOSAL | Status: AC
  Filled 2020-03-30: qty 15

## 2020-03-30 MED ORDER — PROPOFOL 10 MG/ML IV BOLUS
INTRAVENOUS | Status: AC
  Filled 2020-03-30: qty 100

## 2020-03-30 MED ORDER — PROPOFOL 500 MG/50ML IV EMUL
INTRAVENOUS | Status: DC | PRN
  Administered 2020-03-30: 100 ug/kg/min via INTRAVENOUS
  Administered 2020-03-30: 150 ug/kg/min via INTRAVENOUS

## 2020-03-30 MED ORDER — GLYCOPYRROLATE 0.2 MG/ML IJ SOLN
INTRAMUSCULAR | Status: AC
  Filled 2020-03-30: qty 1

## 2020-03-30 MED ORDER — LACTATED RINGERS IV SOLN: INTRAVENOUS | Status: DC

## 2020-03-30 MED ORDER — PROPOFOL 10 MG/ML IV BOLUS
INTRAVENOUS | Status: AC
  Filled 2020-03-30: qty 40

## 2020-03-30 MED ORDER — PHENYLEPHRINE HCL (PRESSORS) 10 MG/ML IV SOLN
INTRAVENOUS | Status: DC | PRN
  Administered 2020-03-30 (×3): 80 ug via INTRAVENOUS
  Administered 2020-03-30: 120 ug via INTRAVENOUS
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
## 2020-03-30 NOTE — Transfer of Care (Signed)
Immediate Anesthesia Transfer of Care Note  Patient: Alex Wilson  Procedure(s) Performed: COLONOSCOPY WITH PROPOFOL (N/A ) ESOPHAGOGASTRODUODENOSCOPY (EGD) WITH PROPOFOL (N/A )  Patient Location: PACU  Anesthesia Type:MAC and General  Level of Consciousness: awake, alert  and oriented  Airway & Oxygen Therapy: Patient Spontanous Breathing  Post-op Assessment: Report given to RN and Post -op Vital signs reviewed and stable  Post vital signs: Reviewed and stable  Last Vitals:  Vitals Value Taken Time  BP 141/120 03/30/20 0925  Temp    Pulse 86 03/30/20 0926  Resp    SpO2 98 % 03/30/20 0926  Vitals shown include unvalidated device data.  Last Pain:  Vitals:   03/30/20 0822  TempSrc: Oral  PainSc: 0-No pain      Patients Stated Pain Goal: 5 (47/65/46 5035)  Complications: No apparent anesthesia complications

## 2020-03-30 NOTE — Op Note (Signed)
[facility] Patient Name: Alex Wilson Procedure Date: 03/30/2020 8:41 AM MRN: [id number] Date of Birth: 06/20/1966 Attending MD: [name] , MD CSN: [id number] Age: 54 Admit Type: Outpatient Procedure:                Colonoscopy Indications:              Screening for colorectal malignant neoplasm Providers:                [name], MD, Sterling Big, RN,                            Dyann Ruddle Referring MD:              Medicines:                Propofol per Anesthesia Complications:            No immediate complications. Estimated Blood Loss:     Estimated blood loss: none. Estimated blood loss:                            none. Procedure:                Pre-Anesthesia Assessment:                           - Prior to the procedure, a History and Physical                            was performed, and patient medications and                            allergies were reviewed. The patient's tolerance of                            previous anesthesia was also reviewed. The risks                            and benefits of the procedure and the sedation                            options and risks were discussed with the patient.                            All questions were answered, and informed consent                            was obtained. Prior Anticoagulants: The patient has                            taken no previous anticoagulant or antiplatelet                            agents. ASA Grade Assessment: II - A patient with                            mild  systemic disease. After reviewing the risks                            and benefits, the patient was deemed in                            satisfactory condition to undergo the procedure.                           After obtaining informed consent, the colonoscope                            was passed under direct vision. Throughout the                            procedure, the patient's blood  pressure, pulse, and                            oxygen saturations were monitored continuously. The                            [id number]-[id number] ([id number]) scope was introduced through                            the anus and advanced to the the cecum, identified                            by appendiceal orifice and ileocecal valve. The                            colonoscopy was performed without difficulty. The                            patient tolerated the procedure well. The quality                            of the bowel preparation was adequate. Scope In: 9:09:28 AM Scope Out: 9:18:37 AM Scope Withdrawal Time: 0 hours 6 minutes 23 seconds  Total Procedure Duration: 0 hours 9 minutes 9 seconds  Findings:      The perianal and digital rectal examinations were normal.      A few small-mouthed diverticula were found in the sigmoid colon.      The exam was otherwise without abnormality on direct and retroflexion       views. Impression:               - Diverticulosis in the sigmoid colon.                           - The examination was otherwise normal on direct                            and retroflexion views.                           -  No specimens collected. Moderate Sedation:      Moderate (conscious) sedation was personally administered by an       anesthesia professional. The following parameters were monitored: oxygen       saturation, heart rate, blood pressure, respiratory rate, EKG, adequacy       of pulmonary ventilation, and response to care. Recommendation:           - Patient has a contact number available for                            emergencies. The signs and symptoms of potential                            delayed complications were discussed with the                            patient. Return to normal activities tomorrow.                            Written discharge instructions were provided to the                            patient.                           - Resume  previous diet.                           - Continue present medications.                           - Repeat colonoscopy in 10 years for screening                            purposes.                           - Return to GI office (date not yet determined).                            See EGD report. Procedure Code(s):        --- Professional ---                           [phone], Colonoscopy, flexible; diagnostic, including                            collection of specimen(s) by brushing or washing,                            when performed (separate procedure) Diagnosis Code(s):        --- Professional ---                           Z12.11, Encounter for screening for malignant  neoplasm of colon                           K57.30, Diverticulosis of large intestine without                            perforation or abscess without bleeding CPT copyright 2019 American Medical Association. All rights reserved. The codes documented in this report are preliminary and upon coder review may  be revised to meet current compliance requirements. Alex Wilson. Alex Peale, MD [name], MD 03/30/2020 9:21:31 AM This report has been signed electronically. Number of Addenda: 0

## 2020-03-30 NOTE — Op Note (Signed)
[facility] Patient Name: Alex Wilson Procedure Date: 03/30/2020 8:41 AM MRN: [id number] Date of Birth: [birth year] Attending MD: Norvel Richards , MD CSN: [id number] Age: 54 Admit Type: Outpatient Procedure:                Upper GI endoscopy Indications:              Gastro-esophageal reflux disease -longstanding Providers:                Norvel Richards, MD, Gerome Sam, RN,                            Aram Candela Referring MD:              Medicines:                Propofol per Anesthesia Complications:            No immediate complications. Estimated Blood Loss:     Estimated blood loss: none. Procedure:                Pre-Anesthesia Assessment:                           - Prior to the procedure, a History and Physical                            was performed, and patient medications and                            allergies were reviewed. The patient's tolerance of                            previous anesthesia was also reviewed. The risks                            and benefits of the procedure and the sedation                            options and risks were discussed with the patient.                            All questions were answered, and informed consent                            was obtained. Prior Anticoagulants: The patient has                            taken no previous anticoagulant or antiplatelet                            agents. ASA Grade Assessment: II - A patient with                            mild systemic disease. After reviewing the risks  and benefits, the patient was deemed in                            satisfactory condition to undergo the procedure.                           After obtaining informed consent, the endoscope was                            passed under direct vision. Throughout the                            procedure, the patient's blood pressure, pulse, and                            oxygen  saturations were monitored continuously. The                            [id number]-[id number] ([id number]) was introduced through the                            mouth, and advanced to the second part of duodenum.                            The upper GI endoscopy was accomplished without                            difficulty. The patient tolerated the procedure                            well. Scope In: 9:01:08 AM Scope Out: 9:04:56 AM Total Procedure Duration: 0 hours 3 minutes 48 seconds  Findings:      The examined esophagus was normal.      The entire examined stomach was normal.      The duodenal bulb and second portion of the duodenum were normal. Impression:               - Normal esophagus.                           - Normal stomach.                           - Normal duodenal bulb and second portion of the                            duodenum.                           - No specimens collected. Moderate Sedation:      Moderate (conscious) sedation was personally administered by an       anesthesia professional. The following parameters were monitored: oxygen       saturation, heart rate, blood pressure, respiratory rate, EKG, adequacy       of pulmonary ventilation, and response to care. Recommendation:           -  Patient has a contact number available for                            emergencies. The signs and symptoms of potential                            delayed complications were discussed with the                            patient. Return to normal activities tomorrow.                            Written discharge instructions were provided to the                            patient.                           - Advance diet as tolerated.                           - Continue present medications. Continue Protonix                            40 mg daily. See colonoscopy report. Procedure Code(s):        --- Professional ---                           [phone], Esophagogastroduodenoscopy,  flexible,                            transoral; diagnostic, including collection of                            specimen(s) by brushing or washing, when performed                            (separate procedure) Diagnosis Code(s):        --- Professional ---                           K21.9, Gastro-esophageal reflux disease without                            esophagitis CPT copyright 2019 American Medical Association. All rights reserved. The codes documented in this report are preliminary and upon coder review may  be revised to meet current compliance requirements. [name]. [name], MD [name], MD 03/30/2020 9:08:28 AM This report has been signed electronically. Number of Addenda: 0

## 2020-03-30 NOTE — Anesthesia Preprocedure Evaluation (Signed)
Anesthesia Evaluation  Patient identified by MRN, date of birth, ID band Patient awake    Reviewed: Allergy & Precautions, H&P , NPO status , Patient's Chart, lab work & pertinent test results, reviewed documented beta blocker date and time   Airway Mallampati: II  TM Distance: >3 FB Neck ROM: full    Dental no notable dental hx. (+) Teeth Intact   Pulmonary neg pulmonary ROS,    Pulmonary exam normal breath sounds clear to auscultation       Cardiovascular Exercise Tolerance: Good negative cardio ROS   Rhythm:regular Rate:Normal     Neuro/Psych negative neurological ROS  negative psych ROS   GI/Hepatic Neg liver ROS, GERD  Medicated,  Endo/Other  negative endocrine ROS  Renal/GU negative Renal ROS  negative genitourinary   Musculoskeletal   Abdominal   Peds  Hematology  (+) Blood dyscrasia, anemia ,   Anesthesia Other Findings   Reproductive/Obstetrics negative OB ROS                             Anesthesia Physical Anesthesia Plan  ASA: III  Anesthesia Plan: General   Post-op Pain Management:    Induction:   PONV Risk Score and Plan: 2 and Propofol infusion  Airway Management Planned:   Additional Equipment:   Intra-op Plan:   Post-operative Plan:   Informed Consent: I have reviewed the patients History and Physical, chart, labs and discussed the procedure including the risks, benefits and alternatives for the proposed anesthesia with the patient or authorized representative who has indicated his/her understanding and acceptance.     Dental Advisory Given  Plan Discussed with: CRNA  Anesthesia Plan Comments:         Anesthesia Quick Evaluation

## 2020-03-30 NOTE — Progress Notes (Signed)
Please excuse Alex Wilson from work Tuesday Mar 28, 2020 through Friday Mar 31, 2020 at 10:00am.  He cannot drive, operate heavy machinery, or sign legal documents until Friday. He can return to work after 10:00am on Friday Mar 31, 2020.

## 2020-03-30 NOTE — Discharge Instructions (Signed)
°Colonoscopy °Discharge Instructions ° °Read the instructions outlined below and refer to this sheet in the next few weeks. These discharge instructions provide you with general information on caring for yourself after you leave the hospital. Your doctor may also give you specific instructions. While your treatment has been planned according to the most current medical practices available, unavoidable complications occasionally occur. If you have any problems or questions after discharge, call Dr. Rourk at [phone]. °ACTIVITY °· You may resume your regular activity, but move at a slower pace for the next 24 hours.  °· Take frequent rest periods for the next 24 hours.  °· Walking will help get rid of the air and reduce the bloated feeling in your belly (abdomen).  °· No driving for 24 hours (because of the medicine (anesthesia) used during the test).   °· Do not sign any important legal documents or operate any machinery for 24 hours (because of the anesthesia used during the test).  °NUTRITION °· Drink plenty of fluids.  °· You may resume your normal diet as instructed by your doctor.  °· Begin with a light meal and progress to your normal diet. Heavy or fried foods are harder to digest and may make you feel sick to your stomach (nauseated).  °· Avoid alcoholic beverages for 24 hours or as instructed.  °MEDICATIONS °· You may resume your normal medications unless your doctor tells you otherwise.  °WHAT YOU CAN EXPECT TODAY °· Some feelings of bloating in the abdomen.  °· Passage of more gas than usual.  °· Spotting of blood in your stool or on the toilet paper.  °IF YOU HAD POLYPS REMOVED DURING THE COLONOSCOPY: °· No aspirin products for 7 days or as instructed.  °· No alcohol for 7 days or as instructed.  °· Eat a soft diet for the next 24 hours.  °FINDING OUT THE RESULTS OF YOUR TEST °Not all test results are available during your visit. If your test results are not back during the visit, make an appointment  with your caregiver to find out the results. Do not assume everything is normal if you have not heard from your caregiver or the medical facility. It is important for you to follow up on all of your test results.  °SEEK IMMEDIATE MEDICAL ATTENTION IF: °· You have more than a spotting of blood in your stool.  °· Your belly is swollen (abdominal distention).  °· You are nauseated or vomiting.  °· You have a temperature over 101.  °· You have abdominal pain or discomfort that is severe or gets worse throughout the day.  °EGD °Discharge instructions °Please read the instructions outlined below and refer to this sheet in the next few weeks. These discharge instructions provide you with general information on caring for yourself after you leave the hospital. Your doctor may also give you specific instructions. While your treatment has been planned according to the most current medical practices available, unavoidable complications occasionally occur. If you have any problems or questions after discharge, please call your doctor. °ACTIVITY °· You may resume your regular activity but move at a slower pace for the next 24 hours.  °· Take frequent rest periods for the next 24 hours.  °· Walking will help expel (get rid of) the air and reduce the bloated feeling in your abdomen.  °· No driving for 24 hours (because of the anesthesia (medicine) used during the test).  °· You may shower.  °· Do not sign any important   legal documents or operate any machinery for 24 hours (because of the anesthesia used during the test).  NUTRITION  Drink plenty of fluids.   You may resume your normal diet.   Begin with a light meal and progress to your normal diet.   Avoid alcoholic beverages for 24 hours or as instructed by your caregiver.  MEDICATIONS  You may resume your normal medications unless your caregiver tells you otherwise.  WHAT YOU CAN EXPECT TODAY  You may experience abdominal discomfort such as a feeling of fullness  or gas pains.  FOLLOW-UP  Your doctor will discuss the results of your test with you.  SEEK IMMEDIATE MEDICAL ATTENTION IF ANY OF THE FOLLOWING OCCUR:  Excessive nausea (feeling sick to your stomach) and/or vomiting.   Severe abdominal pain and distention (swelling).   Trouble swallowing.   Temperature over 101 F (37.8 C).   Rectal bleeding or vomiting of blood.    GERD information provided.  Your esophagus looked good.  Continue to watch your  Diet.  Continue Protonix 40 mg daily  Colon diverticulosis information provided  Your colon looked good as well.  Recommend office visit in 1 year  Recommend repeat colonoscopy for screening purposes in 10 years  At patient request, I called Shannon at [phone] -call rolled to voicemail.     Gastroesophageal Reflux Disease, Adult Gastroesophageal reflux (GER) happens when acid from the stomach flows up into the tube that connects the mouth and the stomach (esophagus). Normally, food travels down the esophagus and stays in the stomach to be digested. With GER, food and stomach acid sometimes move back up into the esophagus. You may have a disease called gastroesophageal reflux disease (GERD) if the reflux:  Happens often.  Causes frequent or very bad symptoms.  Causes problems such as damage to the esophagus. When this happens, the esophagus becomes sore and swollen (inflamed). Over time, GERD can make small holes (ulcers) in the lining of the esophagus. What are the causes? This condition is caused by a problem with the muscle between the esophagus and the stomach. When this muscle is weak or not normal, it does not close properly to keep food and acid from coming back up from the stomach. The muscle can be weak because of:  Tobacco use.  Pregnancy.  Having a certain type of hernia (hiatal hernia).  Alcohol use.  Certain foods and drinks, such as coffee, chocolate, onions, and peppermint. What increases the  risk? You are more likely to develop this condition if you:  Are overweight.  Have a disease that affects your connective tissue.  Use NSAID medicines. What are the signs or symptoms? Symptoms of this condition include:  Heartburn.  Difficult or painful swallowing.  The feeling of having a lump in the throat.  A bitter taste in the mouth.  Bad breath.  Having a lot of saliva.  Having an upset or bloated stomach.  Belching.  Chest pain. Different conditions can cause chest pain. Make sure you see your doctor if you have chest pain.  Shortness of breath or noisy breathing (wheezing).  Ongoing (chronic) cough or a cough at night.  Wearing away of the surface of teeth (tooth enamel).  Weight loss. How is this treated? Treatment will depend on how bad your symptoms are. Your doctor may suggest:  Changes to your diet.  Medicine.  Surgery. Follow these instructions at home: Eating and drinking   Follow a diet as told by your doctor. You may need  to avoid foods and drinks such as: ? Coffee and tea (with or without caffeine). ? Drinks that contain alcohol. ? Energy drinks and sports drinks. ? Bubbly (carbonated) drinks or sodas. ? Chocolate and cocoa. ? Peppermint and mint flavorings. ? Garlic and onions. ? Horseradish. ? Spicy and acidic foods. These include peppers, chili powder, curry powder, vinegar, hot sauces, and BBQ sauce. ? Citrus fruit juices and citrus fruits, such as oranges, lemons, and limes. ? Tomato-based foods. These include red sauce, chili, salsa, and pizza with red sauce. ? Fried and fatty foods. These include donuts, french fries, potato chips, and high-fat dressings. ? High-fat meats. These include hot dogs, rib eye steak, sausage, ham, and bacon. ? High-fat dairy items, such as whole milk, butter, and cream cheese.  Eat small meals often. Avoid eating large meals.  Avoid drinking large amounts of liquid with your meals.  Avoid eating  meals during the 2-3 hours before bedtime.  Avoid lying down right after you eat.  Do not exercise right after you eat. Lifestyle   Do not use any products that contain nicotine or tobacco. These include cigarettes, e-cigarettes, and chewing tobacco. If you need help quitting, ask your doctor.  Try to lower your stress. If you need help doing this, ask your doctor.  If you are overweight, lose an amount of weight that is healthy for you. Ask your doctor about a safe weight loss goal. General instructions  Pay attention to any changes in your symptoms.  Take over-the-counter and prescription medicines only as told by your doctor. Do not take aspirin, ibuprofen, or other NSAIDs unless your doctor says it is okay.  Wear loose clothes. Do not wear anything tight around your waist.  Raise (elevate) the head of your bed about 6 inches (15 cm).  Avoid bending over if this makes your symptoms worse.  Keep all follow-up visits as told by your doctor. This is important. Contact a doctor if:  You have new symptoms.  You lose weight and you do not know why.  You have trouble swallowing or it hurts to swallow.  You have wheezing or a cough that keeps happening.  Your symptoms do not get better with treatment.  You have a hoarse voice. Get help right away if:  You have pain in your arms, neck, jaw, teeth, or back.  You feel sweaty, dizzy, or light-headed.  You have chest pain or shortness of breath.  You throw up (vomit) and your throw-up looks like blood or coffee grounds.  You pass out (faint).  Your poop (stool) is bloody or black.  You cannot swallow, drink, or eat. Summary  If a person has gastroesophageal reflux disease (GERD), food and stomach acid move back up into the esophagus and cause symptoms or problems such as damage to the esophagus.  Treatment will depend on how bad your symptoms are.  Follow a diet as told by your doctor.  Take all medicines only as  told by your doctor. This information is not intended to replace advice given to you by your health care provider. Make sure you discuss any questions you have with your health care provider. Document Revised: 05/06/2018 Document Reviewed: 05/06/2018 Elsevier Patient Education  [place].    Diverticulosis  Diverticulosis is a condition that develops when small pouches (diverticula) form in the wall of the large intestine (colon). The colon is where water is absorbed and stool (feces) is formed. The pouches form when the inside layer of  the colon pushes through weak spots in the outer layers of the colon. You may have a few pouches or many of them. The pouches usually do not cause problems unless they become inflamed or infected. When this happens, the condition is called diverticulitis. What are the causes? The cause of this condition is not known. What increases the risk? The following factors may make you more likely to develop this condition:  Being older than age 17. Your risk for this condition increases with age. Diverticulosis is rare among people younger than age 3. By age 91, many people have it.  Eating a low-fiber diet.  Having frequent constipation.  Being overweight.  Not getting enough exercise.  Smoking.  Taking over-the-counter pain medicines, like aspirin and ibuprofen.  Having a family history of diverticulosis. What are the signs or symptoms? In most people, there are no symptoms of this condition. If you do have symptoms, they may include:  Bloating.  Cramps in the abdomen.  Constipation or diarrhea.  Pain in the lower left side of the abdomen. How is this diagnosed? Because diverticulosis usually has no symptoms, it is most often diagnosed during an exam for other colon problems. The condition may be diagnosed by:  Using a flexible scope to examine the colon (colonoscopy).  Taking an X-ray of the colon after dye has been put into the colon  (barium enema).  Having a CT scan. How is this treated? You may not need treatment for this condition. Your health care provider may recommend treatment to prevent problems. You may need treatment if you have symptoms or if you previously had diverticulitis. Treatment may include:  Eating a high-fiber diet.  Taking a fiber supplement.  Taking a live bacteria supplement (probiotic).  Taking medicine to relax your colon. Follow these instructions at home: Medicines  Take over-the-counter and prescription medicines only as told by your health care provider.  If told by your health care provider, take a fiber supplement or probiotic. Constipation prevention Your condition may cause constipation. To prevent or treat constipation, you may need to:  Drink enough fluid to keep your urine pale yellow.  Take over-the-counter or prescription medicines.  Eat foods that are high in fiber, such as beans, whole grains, and fresh fruits and vegetables.  Limit foods that are high in fat and processed sugars, such as fried or sweet foods.  General instructions  Try not to strain when you have a bowel movement.  Keep all follow-up visits as told by your health care provider. This is important. Contact a health care provider if you:  Have pain in your abdomen.  Have bloating.  Have cramps.  Have not had a bowel movement in 3 days. Get help right away if:  Your pain gets worse.  Your bloating becomes very bad.  You have a fever or chills, and your symptoms suddenly get worse.  You vomit.  You have bowel movements that are bloody or black.  You have bleeding from your rectum. Summary  Diverticulosis is a condition that develops when small pouches (diverticula) form in the wall of the large intestine (colon).  You may have a few pouches or many of them.  This condition is most often diagnosed during an exam for other colon problems.  Treatment may include increasing the  fiber in your diet, taking supplements, or taking medicines. This information is not intended to replace advice given to you by your health care provider. Make sure you discuss any questions you have  with your health care provider. Document Revised: 05/27/2019 Document Reviewed: 05/27/2019 Elsevier Patient Education  2020 [employer]    Monitored Anesthesia Care, Care After These instructions provide you with information about caring for yourself after your procedure. Your health care provider may also give you more specific instructions. Your treatment has been planned according to current medical practices, but problems sometimes occur. Call your health care provider if you have any problems or questions after your procedure. What can I expect after the procedure? After your procedure, you may:  Feel sleepy for several hours.  Feel clumsy and have poor balance for several hours.  Feel forgetful about what happened after the procedure.  Have poor judgment for several hours.  Feel nauseous or vomit.  Have a sore throat if you had a breathing tube during the procedure. Follow these instructions at home: For at least 24 hours after the procedure:      Have a responsible adult stay with you. It is important to have someone help care for you until you are awake and alert.  Rest as needed.  Do not: ? Participate in activities in which you could fall or become injured. ? Drive. ? Use heavy machinery. ? Drink alcohol. ? Take sleeping pills or medicines that cause drowsiness. ? Make important decisions or sign legal documents. ? Take care of children on your own. Eating and drinking  Follow the diet that is recommended by your health care provider.  If you vomit, drink water, juice, or soup when you can drink without vomiting.  Make sure you have little or no nausea before eating solid foods. General instructions  Take over-the-counter and prescription medicines only as  told by your health care provider.  If you have sleep apnea, surgery and certain medicines can increase your risk for breathing problems. Follow instructions from your health care provider about wearing your sleep device: ? Anytime you are sleeping, including during daytime naps. ? While taking prescription pain medicines, sleeping medicines, or medicines that make you drowsy.  If you smoke, do not smoke without supervision.  Keep all follow-up visits as told by your health care provider. This is important. Contact a health care provider if:  You keep feeling nauseous or you keep vomiting.  You feel light-headed.  You develop a rash.  You have a fever. Get help right away if:  You have trouble breathing. Summary  For several hours after your procedure, you may feel sleepy and have poor judgment.  Have a responsible adult stay with you for at least 24 hours or until you are awake and alert. This information is not intended to replace advice given to you by your health care provider. Make sure you discuss any questions you have with your health care provider. Document Revised: 01/26/2018 Document Reviewed: 02/18/2016 Elsevier Patient Education  2020 [employer].

## 2020-03-30 NOTE — Anesthesia Postprocedure Evaluation (Signed)
Anesthesia Post Note  Patient: Alex Wilson  Procedure(s) Performed: COLONOSCOPY WITH PROPOFOL (N/A ) ESOPHAGOGASTRODUODENOSCOPY (EGD) WITH PROPOFOL (N/A )  Patient location during evaluation: PACU Anesthesia Type: General Level of consciousness: awake and alert Pain management: pain level controlled Vital Signs Assessment: post-procedure vital signs reviewed and stable Respiratory status: spontaneous breathing Cardiovascular status: stable Postop Assessment: no apparent nausea or vomiting Anesthetic complications: no     Last Vitals:  Vitals:   03/30/20 0822  BP: 113/84  Pulse: 68  Resp: 18  Temp: 36.7 C  SpO2: 99%    Last Pain:  Vitals:   03/30/20 0822  TempSrc: Oral  PainSc: 0-No pain                 Edison Pace

## 2020-03-31 ENCOUNTER — Ambulatory Visit (INDEPENDENT_AMBULATORY_CARE_PROVIDER_SITE_OTHER): Payer: 59 | Admitting: Family Medicine

## 2020-03-31 ENCOUNTER — Encounter: Payer: Self-pay | Admitting: Family Medicine

## 2020-03-31 VITALS — BP 116/79 | HR 67 | Temp 98.2°F | Ht 66.0 in | Wt 177.2 lb

## 2020-03-31 DIAGNOSIS — R011 Cardiac murmur, unspecified: Secondary | ICD-10-CM

## 2020-03-31 DIAGNOSIS — Z7689 Persons encountering health services in other specified circumstances: Secondary | ICD-10-CM | POA: Diagnosis not present

## 2020-03-31 DIAGNOSIS — E875 Hyperkalemia: Secondary | ICD-10-CM | POA: Diagnosis not present

## 2020-03-31 DIAGNOSIS — Z01818 Encounter for other preprocedural examination: Secondary | ICD-10-CM

## 2020-03-31 DIAGNOSIS — Z8249 Family history of ischemic heart disease and other diseases of the circulatory system: Secondary | ICD-10-CM

## 2020-03-31 DIAGNOSIS — K219 Gastro-esophageal reflux disease without esophagitis: Secondary | ICD-10-CM

## 2020-03-31 DIAGNOSIS — F1021 Alcohol dependence, in remission: Secondary | ICD-10-CM

## 2020-03-31 MED ORDER — PANTOPRAZOLE SODIUM 40 MG PO TBEC: 40.0000 mg | DELAYED_RELEASE_TABLET | Freq: Every day | ORAL | 3 refills | Status: DC

## 2020-03-31 MED ORDER — BACLOFEN 10 MG PO TABS: 10.0000 mg | ORAL_TABLET | Freq: Three times a day (TID) | ORAL | 1 refills | Status: DC
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
## 2020-03-31 NOTE — Patient Instructions (Signed)
You had labs performed today.  You will be contacted with the results of the labs once they are available, usually in the next 3 business days for routine lab work.  If you have an active my chart account, they will be released to your MyChart.  If you prefer to have these labs released to you via telephone, please let us know.  If you had a pap smear or biopsy performed, expect to be contacted in about 7-10 days.   Heart Murmur A heart murmur is an extra sound that is caused by chaotic blood flow through the valves of the heart. The murmur can be heard as a "hum" or "whoosh" sound when blood flows through the heart. There are two types of heart murmurs:  Innocent (benign) murmurs. Most people with this type of heart murmur do not have a heart problem. Many children have innocent heart murmurs. Your health care provider may suggest some basic tests to find out whether your murmur is an innocent murmur. If an innocent heart murmur is found, there is no need for further tests or treatment and no need to restrict activities or stop playing sports.  Abnormal murmurs. These types of murmurs can occur in children and adults. Abnormal murmurs may be a sign of a more serious heart condition, such as a heart defect present at birth (congenital defect) or heart valve disease. What are the causes?  The heart has four areas called chambers. Valves separate the upper and lower chambers from each other (tricuspid valve and mitral valve) and separate the lower chambers of the heart from pathways that lead away from the heart (aortic valve and pulmonary valve). Normally, the valves open to let blood flow through or out of your heart, and then they shut to keep the blood from flowing backward. This condition is caused by heart valves that are not working properly.  In children, abnormal heart murmurs are typically caused by congenital defects.  In adults, abnormal murmurs are usually caused by heart valve problems  from disease, infection, or aging. This condition may also be caused by:  Pregnancy.  Fever.  Overactive thyroid gland.  Anemia.  Exercise.  Rapid growth spurts (in children). What are the signs or symptoms? Innocent murmurs do not cause symptoms, and many people with abnormal murmurs may not have symptoms. If symptoms do develop, they may include:  Shortness of breath.  Blue coloring of the skin, especially on the fingertips.  Chest pain.  Palpitations, or feeling a fluttering or skipped heartbeat.  Fainting.  Persistent cough.  Getting tired much faster than expected.  Swelling in the abdomen, feet, or ankles. How is this diagnosed? This condition may be diagnosed during a routine physical or other exam. If your health care provider hears a murmur with a stethoscope, he or she will listen for:  Where the murmur is located in your heart.  How long the murmur lasts (duration).  When the murmur is heard during the heartbeat.  How loud the murmur is. This may help the health care provider figure out what is causing the murmur. You may be referred to a heart specialist (cardiologist). You may also have other tests, including:  Electrocardiogram (ECG or EKG). This test measures the electrical activity of your heart.  Echocardiogram. This test uses high frequency sound waves to make pictures of your heart.  MRI or chest X-ray.  Cardiac catheterization. This test looks at blood flow through the arteries around the heart. For children and adults  who have an abnormal heart murmur and want to stay active, it is important to:  Complete testing.  Review test results.  Receive recommendations from your health care provider. If heart disease is present, it may not be safe to play or be active. How is this treated? Heart murmurs themselves do not need treatment. In some cases, a heart murmur may go away on its own. If an underlying problem or disease is causing the  murmur, you may need treatment. If treatment is needed, it will depend on the type and severity of the disease or heart problem causing the murmur. Treatment may include:  Medicine.  Surgery.  Dietary and lifestyle changes. Follow these instructions at home:  Talk with your health care provider before participating in sports or other activities that require a lot of effort and energy (are strenuous).  Learn as much as possible about your condition and any related diseases. Ask your health care provider if you may be at risk for any medical emergencies.  Talk with your health care provider about what symptoms you should look out for.  It is up to you to get your test results. Ask your health care provider, or the department that is doing the test, when your results will be ready.  Keep all follow-up visits as told by your health care provider. This is important. Contact a health care provider if:  You are frequently short of breath.  You feel more tired than usual.  You are having a hard time keeping up with normal activities or fitness routines.  You have swelling in your ankles or feet.  You notice that your heart often beats irregularly.  You develop any new symptoms. Get help right away if:  You have chest pain.  You are having trouble breathing.  You feel light-headed or you pass out.  Your symptoms suddenly get worse. These symptoms may represent a serious problem that is an emergency. Do not wait to see if the symptoms will go away. Get medical help right away. Call your local emergency services (911 in the [place] Do not drive yourself to the hospital. Summary  Normally, the heart valves open to let blood flow through or out of your heart, and then they shut to keep the blood from flowing backward.  A heart murmur is caused by heart valves that are not working properly.  You may need treatment if an underlying problem or disease is causing the heart murmur.  Treatment may include medicine, surgery, or dietary and lifestyle changes.  Talk with your health care provider before participating in sports or other activities that require a lot of effort and energy (are strenuous).  Talk with your health care provider about what symptoms you should watch out for. This information is not intended to replace advice given to you by your health care provider. Make sure you discuss any questions you have with your health care provider. Document Revised: 04/21/2018 Document Reviewed: 04/21/2018 Elsevier Patient Education  2020 [employer].

## 2020-04-01 LAB — CMP14+EGFR
ALT: 19 IU/L (ref 0–44)
AST: 19 IU/L (ref 0–40)
Albumin/Globulin Ratio: 2.2 (ref 1.2–2.2)
Albumin: 4.8 g/dL (ref 3.8–4.9)
Alkaline Phosphatase: 59 IU/L (ref 48–121)
BUN/Creatinine Ratio: 14 (ref 9–20)
BUN: 11 mg/dL (ref 6–24)
Bilirubin Total: 0.5 mg/dL (ref 0.0–1.2)
CO2: 22 mmol/L (ref 20–29)
Calcium: 9.5 mg/dL (ref 8.7–10.2)
Chloride: 109 mmol/L — ABNORMAL HIGH (ref 96–106)
Creatinine, Ser: 0.81 mg/dL (ref 0.76–1.27)
GFR calc Af Amer: 118 mL/min/{1.73_m2} (ref >59)
GFR calc non Af Amer: 102 mL/min/{1.73_m2} (ref >59)
Globulin, Total: 2.2 g/dL (ref 1.5–4.5)
Glucose: 82 mg/dL (ref 65–99)
Potassium: 4.4 mmol/L (ref 3.5–5.2)
Sodium: 145 mmol/L — ABNORMAL HIGH (ref 134–144)
Total Protein: 7 g/dL (ref 6.0–8.5)

## 2020-04-01 LAB — CBC
Hematocrit: 44.1 % (ref 37.5–51.0)
Hemoglobin: 15 g/dL (ref 13.0–17.7)
MCH: 30.9 pg (ref 26.6–33.0)
MCHC: 34 g/dL (ref 31.5–35.7)
MCV: 91 fL (ref 79–97)
Platelets: 272 10*3/uL (ref 150–450)
RBC: 4.85 x10E6/uL (ref 4.14–5.80)
RDW: 12.2 % (ref 11.6–15.4)
WBC: 5.8 10*3/uL (ref 3.4–10.8)

## 2020-04-01 LAB — PROTIME-INR
INR: 1 (ref 0.9–1.2)
Prothrombin Time: 10.5 s (ref 9.1–12.0)

## 2020-04-07 ENCOUNTER — Other Ambulatory Visit: Payer: Self-pay

## 2020-04-07 ENCOUNTER — Ambulatory Visit (HOSPITAL_COMMUNITY)
Admission: RE | Admit: 2020-04-07 | Discharge: 2020-04-07 | Disposition: A | Discharge status: Home / Self Care | Payer: 59 | Source: Ambulatory Visit | Attending: Family Medicine | Admitting: Family Medicine

## 2020-04-07 DIAGNOSIS — I083 Combined rheumatic disorders of mitral, aortic and tricuspid valves: Secondary | ICD-10-CM | POA: Diagnosis not present

## 2020-04-07 DIAGNOSIS — I77810 Thoracic aortic ectasia: Secondary | ICD-10-CM | POA: Insufficient documentation

## 2020-04-07 DIAGNOSIS — K219 Gastro-esophageal reflux disease without esophagitis: Secondary | ICD-10-CM | POA: Diagnosis not present

## 2020-04-07 DIAGNOSIS — Z8249 Family history of ischemic heart disease and other diseases of the circulatory system: Secondary | ICD-10-CM | POA: Insufficient documentation

## 2020-04-07 DIAGNOSIS — R011 Cardiac murmur, unspecified: Secondary | ICD-10-CM | POA: Diagnosis not present

## 2020-04-07 NOTE — Progress Notes (Signed)
  Echocardiogram 2D Echocardiogram has been performed.  Leta Jungling M 04/07/2020, 3:58 PM

## 2020-04-11 ENCOUNTER — Encounter: Payer: Self-pay | Admitting: Family Medicine

## 2020-04-12 DIAGNOSIS — Z4789 Encounter for other orthopedic aftercare: Secondary | ICD-10-CM | POA: Insufficient documentation

## 2020-04-13 NOTE — Telephone Encounter (Signed)
Pt came into the office today and states he had a echocardiogram and is needing a referral to cardiology due to the results of that. He would like to be referred to Carolan Shiver, with [facility]. Pt states that the echo showed a mild regurgitation. CB#: [phone]

## 2020-04-14 ENCOUNTER — Telehealth: Payer: Self-pay | Admitting: Family Medicine

## 2020-04-14 ENCOUNTER — Other Ambulatory Visit: Payer: Self-pay | Admitting: Family Medicine

## 2020-04-14 DIAGNOSIS — Z8249 Family history of ischemic heart disease and other diseases of the circulatory system: Secondary | ICD-10-CM

## 2020-04-14 DIAGNOSIS — R011 Cardiac murmur, unspecified: Secondary | ICD-10-CM

## 2020-04-14 NOTE — Telephone Encounter (Signed)
This was already done today. Did the referral not show up?

## 2020-04-14 NOTE — Telephone Encounter (Signed)
It was .Marland Kitchen My mistake - Thank You :)

## 2020-04-14 NOTE — Telephone Encounter (Signed)
  REFERRAL REQUEST Telephone Note 04/14/2020  What type of referral do you need? Cardiology   Have you been seen at our office for this problem? Patient had an echo done on 04/11/2020 and they stated he needed to be referred to Cardiology (Advise that they may need an appointment with their PCP before a referral can be done)  Is there a particular doctor or location that you prefer? Dr. Katrinka Blazing (name left on Ref VM)  Patient notified that referrals can take up to a week or longer to process. If they haven't heard anything within a week they should call back and speak with the referral department.

## 2020-05-11 ENCOUNTER — Other Ambulatory Visit: Payer: Self-pay | Admitting: Family

## 2020-05-23 ENCOUNTER — Inpatient Hospital Stay (HOSPITAL_COMMUNITY): Admission: RE | Admit: 2020-05-23 | Payer: 59 | Source: Ambulatory Visit

## 2020-05-23 ENCOUNTER — Other Ambulatory Visit (HOSPITAL_COMMUNITY): Payer: 59
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
## 2020-06-09 ENCOUNTER — Other Ambulatory Visit: Payer: Self-pay

## 2020-06-09 ENCOUNTER — Ambulatory Visit (INDEPENDENT_AMBULATORY_CARE_PROVIDER_SITE_OTHER): Payer: 59 | Admitting: Interventional Cardiology

## 2020-06-09 ENCOUNTER — Encounter: Payer: Self-pay | Admitting: Interventional Cardiology

## 2020-06-09 VITALS — BP 122/82 | HR 65 | Ht 66.0 in | Wt 188.8 lb

## 2020-06-09 DIAGNOSIS — I359 Nonrheumatic aortic valve disorder, unspecified: Secondary | ICD-10-CM

## 2020-06-09 DIAGNOSIS — Z7189 Other specified counseling: Secondary | ICD-10-CM | POA: Diagnosis not present

## 2020-06-09 DIAGNOSIS — I77810 Thoracic aortic ectasia: Secondary | ICD-10-CM | POA: Diagnosis not present

## 2020-06-09 LAB — BASIC METABOLIC PANEL
BUN/Creatinine Ratio: 16 (ref 9–20)
BUN: 14 mg/dL (ref 6–24)
CO2: 21 mmol/L (ref 20–29)
Calcium: 10 mg/dL (ref 8.7–10.2)
Chloride: 101 mmol/L (ref 96–106)
Creatinine, Ser: 0.86 mg/dL (ref 0.76–1.27)
GFR calc Af Amer: 115 mL/min/{1.73_m2} (ref >59)
GFR calc non Af Amer: 100 mL/min/{1.73_m2} (ref >59)
Glucose: 91 mg/dL (ref 65–99)
Potassium: 4.4 mmol/L (ref 3.5–5.2)
Sodium: 136 mmol/L (ref 134–144)

## 2020-06-09 NOTE — Patient Instructions (Signed)
Medication Instructions:  Your physician recommends that you continue on your current medications as directed. Please refer to the Current Medication list given to you today.  *If you need a refill on your cardiac medications before your next appointment, please call your pharmacy*   Lab Work: BMET today  If you have labs (blood work) drawn today and your tests are completely normal, you will receive your results only by: Marland Kitchen MyChart Message (if you have MyChart) OR . A paper copy in the mail If you have any lab test that is abnormal or we need to change your treatment, we will call you to review the results.   Testing/Procedures: Your physician recommends that you have a Chest CT Aorta with contrast.   Follow-Up: At [facility], you and your health needs are our priority.  As part of our continuing mission to provide you with exceptional heart care, we have created designated Provider Care Teams.  These Care Teams include your primary Cardiologist (physician) and Advanced Practice Providers (APPs -  Physician Assistants and Nurse Practitioners) who all work together to provide you with the care you need, when you need it.  We recommend signing up for the patient portal called "MyChart".  Sign up information is provided on this After Visit Summary.  MyChart is used to connect with patients for Virtual Visits (Telemedicine).  Patients are able to view lab/test results, encounter notes, upcoming appointments, etc.  Non-urgent messages can be sent to your provider as well.   To learn more about what you can do with MyChart, go to [email or web address].    Your next appointment:   12 month(s)  The format for your next appointment:   In Person  Provider:   You may see Dr. Verdis Prime or one of the following Advanced Practice Providers on your designated Care Team:    Norma Fredrickson, NP  Nada Boozer, NP  Georgie Chard, NP    Other Instructions

## 2020-06-23 ENCOUNTER — Ambulatory Visit
Admission: RE | Admit: 2020-06-23 | Discharge: 2020-06-23 | Disposition: A | Discharge status: Home / Self Care | Payer: 59 | Source: Ambulatory Visit | Attending: Interventional Cardiology | Admitting: Interventional Cardiology

## 2020-06-23 DIAGNOSIS — I359 Nonrheumatic aortic valve disorder, unspecified: Secondary | ICD-10-CM

## 2020-06-23 DIAGNOSIS — I77810 Thoracic aortic ectasia: Secondary | ICD-10-CM

## 2020-06-23 MED ORDER — IOPAMIDOL (ISOVUE-370) INJECTION 76%
75.0000 mL | Freq: Once | INTRAVENOUS | Status: AC | PRN
  Administered 2020-06-23: 75 mL via INTRAVENOUS

## 2020-06-23 NOTE — Progress Notes (Signed)
DUE TO COVID-19 ONLY ONE VISITOR IS ALLOWED TO COME WITH YOU AND STAY IN THE WAITING ROOM ONLY DURING PRE OP AND PROCEDURE DAY OF SURGERY. THE 1 VISITOR  MAY VISIT WITH YOU AFTER SURGERY IN YOUR PRIVATE ROOM DURING VISITING HOURS ONLY!  YOU NEED TO HAVE A COVID 19 TEST ON_______ @_______ , THIS TEST MUST BE DONE BEFORE SURGERY,  COVID TESTING SITE [address] [place] , IT IS ON THE RIGHT GOING OUT [address] APPROXIMATELY  2 MINUTES PAST [employer] ON THE RIGHT. ONCE YOUR COVID TEST IS COMPLETED,  PLEASE BEGIN THE QUARANTINE INSTRUCTIONS AS OUTLINED IN YOUR HANDOUT.                Alex Wilson  06/23/2020   Your procedure is scheduled on:       06/29/2020   Report to [facility] Main  Entrance   Report to admitting at    0530 AM     Call this number if you have problems the morning of surgery [phone]    Remember: Do not eat food    :After Midnight. BRUSH YOUR TEETH MORNING OF SURGERY AND RINSE YOUR MOUTH OUT, NO CHEWING GUM CANDY OR MINTS.  NO SOLID FOOD AFTER MIDNIGHT THE NIGHT PRIOR TO SURGERY. NOTHING BY MOUTH EXCEPT CLEAR LIQUIDS UNTIL   0430am . PLEASE FINISH ENSURE DRINK PER SURGEON ORDER  WHICH NEEDS TO BE COMPLETED AT .0430am    CLEAR LIQUID DIET   Foods Allowed                                                    Coffee and tea, regular and decaf                            Plain Jell-O any favor except red or purple                                         Fruit ices (not with fruit pulp)                                   Iced Popsicles                                    Carbonated beverages, regular and diet                                    Cranberry, grape and apple juices Sports drinks like Gatorade Lightly seasoned clear broth or consume(fat free) Sugar, honey syrup  _____________________________________________________________________    Take these medicines the morning of surgery with A SIP OF WATER: Protonix                                  You may not have any metal on your body including hair pins and              piercings  Do not wear jewelry, make-up, lotions, powders or perfumes, deodorant             Do not wear nail polish on your fingernails.  Do not shave  48 hours prior to surgery.              Men may shave face and neck.   Do not bring valuables to the hospital. [facility] IS NOT             RESPONSIBLE   FOR VALUABLES.  Contacts, dentures or bridgework may not be worn into surgery.  Leave suitcase in the car. After surgery it may be brought to your room.     Patients discharged the day of surgery will not be allowed to drive home. IF YOU ARE HAVING SURGERY AND GOING HOME THE SAME DAY, YOU MUST HAVE AN ADULT TO DRIVE YOU HOME AND BE WITH YOU FOR 24 HOURS. YOU MAY GO HOME BY TAXI OR UBER OR ORTHERWISE, BUT AN ADULT MUST ACCOMPANY YOU HOME AND STAY WITH YOU FOR 24 HOURS.  Name and phone number of your driver:                Please read over the following fact sheets you were given: _____________________________________________________________________  [facility] - Preparing for Surgery Before surgery, you can play an important role.  Because skin is not sterile, your skin needs to be as free of germs as possible.  You can reduce the number of germs on your skin by washing with CHG (chlorahexidine gluconate) soap before surgery.  CHG is an antiseptic cleaner which kills germs and bonds with the skin to continue killing germs even after washing. Please DO NOT use if you have an allergy to CHG or antibacterial soaps.  If your skin becomes reddened/irritated stop using the CHG and inform your nurse when you arrive at Short Stay. Do not shave (including legs and underarms) for at least 48 hours  prior to the first CHG shower.  You may shave your face/neck. Please follow these instructions carefully:  1.  Shower with CHG Soap the night before surgery and the  morning of Surgery.  2.  If you choose to wash your hair, wash your hair first as usual with your  normal  shampoo.  3.  After you shampoo, rinse your hair and body thoroughly to remove the  shampoo.                           4.  Use CHG as you would any other liquid soap.  You can apply chg directly  to the skin and wash                       Gently with a scrungie or clean washcloth.  5.  Apply the CHG Soap to your body ONLY FROM THE NECK DOWN.   Do not use on face/ open  Wound or open sores. Avoid contact with eyes, ears mouth and genitals (private parts).                       Wash face,  Genitals (private parts) with your normal soap.             6.  Wash thoroughly, paying special attention to the area where your surgery  will be performed.  7.  Thoroughly rinse your body with warm water from the neck down.  8.  DO NOT shower/wash with your normal soap after using and rinsing off  the CHG Soap.                9.  Pat yourself dry with a clean towel.            10.  Wear clean pajamas.            11.  Place clean sheets on your bed the night of your first shower and do not  sleep with pets. Day of Surgery : Do not apply any lotions/deodorants the morning of surgery.  Please wear clean clothes to the hospital/surgery center.  FAILURE TO FOLLOW THESE INSTRUCTIONS MAY RESULT IN THE CANCELLATION OF YOUR SURGERY PATIENT SIGNATURE_________________________________  NURSE SIGNATURE__________________________________  ________________________________________________________________________ [facility]- Preparing for Total Shoulder Arthroplasty    Before surgery, you can play an important role. Because skin is not sterile, your skin needs to be as free of germs as possible. You can reduce the number of germs  on your skin by using the following products. . Benzoyl Peroxide Gel o Reduces the number of germs present on the skin o Applied twice a day to shoulder area starting two days before surgery    ==================================================================  Please follow these instructions carefully:  BENZOYL PEROXIDE 5% GEL  Please do not use if you have an allergy to benzoyl peroxide.   If your skin becomes reddened/irritated stop using the benzoyl peroxide.  Starting two days before surgery, apply as follows: 1. Apply benzoyl peroxide in the morning and at night. Apply after taking a shower. If you are not taking a shower clean entire shoulder front, back, and side along with the armpit with a clean wet washcloth.  2. Place a quarter-sized dollop on your shoulder and rub in thoroughly, making sure to cover the front, back, and side of your shoulder, along with the armpit.   2 days before ____ AM   ____ PM              1 day before ____ AM   ____ PM                         3. Do this twice a day for two days.  (Last application is the night before surgery, AFTER using the CHG soap as described below).  4. Do NOT apply benzoyl peroxide gel on the day of surgery.   Incentive Spirometer  An incentive spirometer is a tool that can help keep your lungs clear and active. This tool measures how well you are filling your lungs with each breath. Taking long deep breaths may help reverse or decrease the chance of developing breathing (pulmonary) problems (especially infection) following:  A long period of time when you are unable to move or be active. BEFORE THE PROCEDURE   If the spirometer includes an indicator to show your best effort, your nurse or respiratory therapist will  set it to a desired goal.  If possible, sit up straight or lean slightly forward. Try not to slouch.  Hold the incentive spirometer in an upright position. INSTRUCTIONS FOR USE  1. Sit on the edge of your  bed if possible, or sit up as far as you can in bed or on a chair. 2. Hold the incentive spirometer in an upright position. 3. Breathe out normally. 4. Place the mouthpiece in your mouth and seal your lips tightly around it. 5. Breathe in slowly and as deeply as possible, raising the piston or the ball toward the top of the column. 6. Hold your breath for 3-5 seconds or for as long as possible. Allow the piston or ball to fall to the bottom of the column. 7. Remove the mouthpiece from your mouth and breathe out normally. 8. Rest for a few seconds and repeat Steps 1 through 7 at least 10 times every 1-2 hours when you are awake. Take your time and take a few normal breaths between deep breaths. 9. The spirometer may include an indicator to show your best effort. Use the indicator as a goal to work toward during each repetition. 10. After each set of 10 deep breaths, practice coughing to be sure your lungs are clear. If you have an incision (the cut made at the time of surgery), support your incision when coughing by placing a pillow or rolled up towels firmly against it. Once you are able to get out of bed, walk around indoors and cough well. You may stop using the incentive spirometer when instructed by your caregiver.  RISKS AND COMPLICATIONS  Take your time so you do not get dizzy or light-headed.  If you are in pain, you may need to take or ask for pain medication before doing incentive spirometry. It is harder to take a deep breath if you are having pain. AFTER USE  Rest and breathe slowly and easily.  It can be helpful to keep track of a log of your progress. Your caregiver can provide you with a simple table to help with this. If you are using the spirometer at home, follow these instructions: SEEK MEDICAL CARE IF:   You are having difficultly using the spirometer.  You have trouble using the spirometer as often as instructed.  Your pain medication is not giving enough relief while  using the spirometer.  You develop fever of 100.5 F (38.1 C) or higher. SEEK IMMEDIATE MEDICAL CARE IF:   You cough up bloody sputum that had not been present before.  You develop fever of 102 F (38.9 C) or greater.  You develop worsening pain at or near the incision site. MAKE SURE YOU:   Understand these instructions.  Will watch your condition.  Will get help right away if you are not doing well or get worse. Document Released: 03/10/2007 Document Revised: 01/20/2012 Document Reviewed: 05/11/2007 [facility] Patient Information 2014 [employer], [place].   ________________________________________________________________________

## 2020-06-26 ENCOUNTER — Encounter (HOSPITAL_COMMUNITY)
Admission: RE | Admit: 2020-06-26 | Discharge: 2020-06-26 | Disposition: A | Discharge status: Home / Self Care | Payer: 59 | Source: Ambulatory Visit | Attending: Orthopedic Surgery | Admitting: Orthopedic Surgery

## 2020-06-26 ENCOUNTER — Other Ambulatory Visit: Payer: Self-pay

## 2020-06-26 ENCOUNTER — Encounter (HOSPITAL_COMMUNITY): Payer: Self-pay

## 2020-06-26 ENCOUNTER — Telehealth: Payer: Self-pay | Admitting: Interventional Cardiology

## 2020-06-26 ENCOUNTER — Encounter (HOSPITAL_COMMUNITY): Payer: Self-pay | Admitting: Physician Assistant

## 2020-06-26 ENCOUNTER — Other Ambulatory Visit (HOSPITAL_COMMUNITY)
Admission: RE | Admit: 2020-06-26 | Discharge: 2020-06-26 | Disposition: A | Discharge status: Home / Self Care | Payer: 59 | Source: Ambulatory Visit | Attending: Orthopedic Surgery | Admitting: Orthopedic Surgery

## 2020-06-26 DIAGNOSIS — Z20822 Contact with and (suspected) exposure to covid-19: Secondary | ICD-10-CM | POA: Diagnosis not present

## 2020-06-26 DIAGNOSIS — M19012 Primary osteoarthritis, left shoulder: Secondary | ICD-10-CM | POA: Diagnosis not present

## 2020-06-26 DIAGNOSIS — Z01812 Encounter for preprocedural laboratory examination: Secondary | ICD-10-CM | POA: Insufficient documentation

## 2020-06-26 DIAGNOSIS — K219 Gastro-esophageal reflux disease without esophagitis: Secondary | ICD-10-CM | POA: Diagnosis not present

## 2020-06-26 DIAGNOSIS — Z79899 Other long term (current) drug therapy: Secondary | ICD-10-CM | POA: Diagnosis not present

## 2020-06-26 HISTORY — DX: Cardiac murmur, unspecified: R01.1

## 2020-06-26 HISTORY — DX: Unspecified osteoarthritis, unspecified site: M19.90

## 2020-06-26 LAB — CBC
HCT: 44.8 % (ref 39.0–52.0)
Hemoglobin: 15.4 g/dL (ref 13.0–17.0)
MCH: 30.7 pg (ref 26.0–34.0)
MCHC: 34.4 g/dL (ref 30.0–36.0)
MCV: 89.4 fL (ref 80.0–100.0)
Platelets: 243 10*3/uL (ref 150–400)
RBC: 5.01 MIL/uL (ref 4.22–5.81)
RDW: 12 % (ref 11.5–15.5)
WBC: 5.1 10*3/uL (ref 4.0–10.5)
nRBC: 0 % (ref 0.0–0.2)

## 2020-06-26 LAB — BASIC METABOLIC PANEL
Anion gap: 10 (ref 5–15)
BUN: 16 mg/dL (ref 6–20)
CO2: 27 mmol/L (ref 22–32)
Calcium: 9.8 mg/dL (ref 8.9–10.3)
Chloride: 103 mmol/L (ref 98–111)
Creatinine, Ser: 0.86 mg/dL (ref 0.61–1.24)
GFR calc Af Amer: >60 mL/min (ref >60)
GFR calc non Af Amer: >60 mL/min (ref >60)
Glucose, Bld: 100 mg/dL — ABNORMAL HIGH (ref 70–99)
Potassium: 4.9 mmol/L (ref 3.5–5.1)
Sodium: 140 mmol/L (ref 135–145)

## 2020-06-26 LAB — SARS CORONAVIRUS 2 (TAT 6-24 HRS): SARS Coronavirus 2: NEGATIVE

## 2020-06-26 LAB — SURGICAL PCR SCREEN
MRSA, PCR: NEGATIVE
Staphylococcus aureus: NEGATIVE
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
## 2020-06-26 NOTE — Progress Notes (Signed)
Anesthesia Review:  HUT:[id number]  Cardiologist :dr Verdis Prime  lov- 06/09/20 Chest x-ray : EKG :03/31/20 Ct ANIGIO  CHEST- 06/23/20  Echo :04/11/20  Stress test: Cardiac Cath :  Activity level: CAN DO A FLIGHT OF STEPS WITHOUT DIFFICULTY  Sleep Study/ CPAP : Fasting Blood Sugar :      / Checks Blood Sugar -- times a day:   Blood Thinner/ Instructions /Last Dose: ASA / Instructions/ Last Dose :

## 2020-06-26 NOTE — Progress Notes (Signed)
Pt has been made aware of normal result and verbalized understanding.  jw

## 2020-06-26 NOTE — Telephone Encounter (Signed)
Call transferred to [employer].

## 2020-06-26 NOTE — Anesthesia Preprocedure Evaluation (Deleted)
Anesthesia Evaluation Anesthesia Physical Anesthesia Plan  ASA:   Anesthesia Plan:    Post-op Pain Management:    Induction:   PONV Risk Score and Plan:   Airway Management Planned:   Additional Equipment:   Intra-op Plan:   Post-operative Plan:   Informed Consent:   Plan Discussed with:   Anesthesia Plan Comments: (See PAT note 06/26/2020, Jodell Cipro, PA-C)        Anesthesia Quick Evaluation

## 2020-06-27 ENCOUNTER — Other Ambulatory Visit (HOSPITAL_COMMUNITY): Payer: 59

## 2020-06-28 NOTE — Anesthesia Preprocedure Evaluation (Deleted)
Anesthesia Evaluation    Reviewed: Allergy & Precautions, H&P , Patient's Chart, lab work & pertinent test results  History of Anesthesia Complications Negative for: history of anesthetic complications  Airway        Dental   Pulmonary neg pulmonary ROS,           Cardiovascular negative cardio ROS    IMPRESSIONS   1. Left ventricular ejection fraction, by estimation, is 50 to 55%. The left ventricle has low normal function. The left ventricle has no regional wall motion abnormalities. There is mild asymmetric left ventricular hypertrophy. Left ventricular  diastolic parameters were normal. 2. Right ventricular systolic function is normal. The right ventricular size is normal. There is normal pulmonary artery systolic pressure. 3. The mitral valve is grossly normal. Mild mitral valve regurgitation. No evidence of mitral stenosis. 4. The aortic valve is not seen well in the short axis view. Cannot rule out a bicuspid AV. Suggest TEE if clinically indicated. . The aortic valve has an indeterminant number of cusps. Aortic valve regurgitation is mild. 5. Aortic dilatation noted. 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    Neuro/Psych PSYCHIATRIC DISORDERS Anxiety negative neurological ROS     GI/Hepatic Neg liver ROS, GERD  Medicated,  Endo/Other  negative endocrine ROS  Renal/GU negative Renal ROS  negative genitourinary   Musculoskeletal   Abdominal   Peds  Hematology  (+) Blood dyscrasia, anemia ,   Anesthesia Other Findings   Reproductive/Obstetrics negative OB ROS                             Anesthesia Physical  Anesthesia Plan  ASA: II  Anesthesia Plan: General   Post-op Pain Management:  Regional for Post-op pain   Induction:   PONV Risk Score and Plan: 2 and Ondansetron and Dexamethasone  Airway Management Planned: Oral  ETT  Additional Equipment:   Intra-op Plan:   Post-operative Plan: Extubation in OR  Informed Consent:   Plan Discussed with: Anesthesiologist  Anesthesia Plan Comments:         Anesthesia Quick Evaluation

## 2020-06-28 NOTE — Progress Notes (Signed)
Darel Hong RN in short stay made aware that Anesthesia wants Alex Wilson retested for COVID morning of surgery due to potential exposure to COVID since pre surgery testing.  Darel Hong stated she will call the patient to have him arrive at 5:15 AM tomorrow.

## 2020-06-29 ENCOUNTER — Ambulatory Visit (HOSPITAL_COMMUNITY)
Admission: RE | Admit: 2020-06-29 | Payer: BLUE CROSS/BLUE SHIELD | Source: Home / Self Care | Admitting: Orthopedic Surgery

## 2020-06-29 ENCOUNTER — Encounter (HOSPITAL_COMMUNITY): Admission: RE | Payer: Self-pay | Source: Home / Self Care

## 2020-06-29 SURGERY — ARTHROPLASTY, SHOULDER, TOTAL, REVERSE
Anesthesia: Choice | Site: Shoulder | Laterality: Left

## 2020-07-07 ENCOUNTER — Encounter: Payer: Self-pay | Admitting: Gastroenterology

## 2020-07-07 ENCOUNTER — Ambulatory Visit (INDEPENDENT_AMBULATORY_CARE_PROVIDER_SITE_OTHER): Payer: BLUE CROSS/BLUE SHIELD | Admitting: Gastroenterology

## 2020-07-07 ENCOUNTER — Other Ambulatory Visit: Payer: Self-pay

## 2020-07-07 VITALS — BP 136/87 | HR 63 | Temp 97.3°F | Ht 66.0 in | Wt 190.0 lb

## 2020-07-07 DIAGNOSIS — K219 Gastro-esophageal reflux disease without esophagitis: Secondary | ICD-10-CM

## 2020-07-07 NOTE — Progress Notes (Signed)
CC'ED TO PCP 

## 2020-07-07 NOTE — Patient Instructions (Signed)
Continue Protonix once daily.  If you have significant abdominal bloating, more frequent stools, or diarrhea, let me know!  We will see you back in 1 year!   I enjoyed seeing you again today! As you know, I value our relationship and want to provide genuine, compassionate, and quality care. I welcome your feedback. If you receive a survey regarding your visit,  I greatly appreciate you taking time to fill this out. See you next time!  Gelene Mink, PhD, ANP-BC [facility]
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
## 2020-07-10 ENCOUNTER — Other Ambulatory Visit: Payer: Self-pay | Admitting: Family Medicine

## 2020-07-14 NOTE — Patient Instructions (Addendum)
DUE TO COVID-19 ONLY ONE VISITOR IS ALLOWED TO COME WITH YOU AND STAY IN THE WAITING ROOM ONLY  DURING  PRE OP AND PROCEDURE.   IF YOU WILL BE ADMITTED INTO THE HOSPITAL YOU ARE ALLOWED ONE SUPPORT PERSON DURING VISITATION  HOURS  ONLY (10AM -8PM)   . The support person may change daily. . The support person must pass our screening, gel in and out, and wear a mask at all times, including in the patient's room. . Patients must also wear a mask when staff or their support person are in the room.   COVID SWAB TESTING MUST BE COMPLETED ON:  Friday, 07-21-20 @ 12:05 PM    [address] [place], [place] [address]  (Must self quarantine after testing. Follow instructions on handout.)        Your procedure is scheduled on:  Tuesday, 07-25-20   Report to [facility] Main  Entrance   Report to admitting at 10:00 AM   Call this number if you have problems the morning of surgery [phone]   Do not eat food :After Midnight.   May have liquids until 9:30 AM day of surgery   CLEAR LIQUID DIET  Foods Allowed                                                                     Foods Excluded  Water, Black Coffee and tea, regular and decaf              liquids that you cannot  Plain Jell-O in any flavor  (No red)                                     see through such as: Fruit ices (not with fruit pulp)                                      milk, soups, orange juice              Iced Popsicles (No red)                                      All solid food                                   Apple juices Sports drinks like Gatorade (No red) Lightly seasoned clear broth or consume(fat free)  Sugar, honey syrup    Complete one Ensure drink the morning of surgery at  9:30 AM the day of surgery.    Oral Hygiene is also important to reduce your risk of infection.                                    Remember - BRUSH YOUR TEETH THE MORNING OF SURGERY WITH YOUR REGULAR TOOTHPASTE   Do NOT smoke  after Midnight  Take these medicines the morning of surgery with A SIP OF WATER:  Pantoprazole                                You may not have any metal on your body including jewelry, and body piercings             Do not wear lotions, powders, perfumes/cologne, or deodorant             Men may shave face and neck.   Do not bring valuables to the hospital. [facility] IS NOT RESPONSIBLE   FOR VALUABLES.   Contacts, dentures or bridgework may not be worn into surgery.   Bring small overnight bag day of surgery.                 Please read over the following fact sheets you were given: IF YOU HAVE QUESTIONS ABOUT YOUR PRE OP INSTRUCTIONS ` PLEASE CALL [phone]   [place]- Preparing for Total Shoulder Arthroplasty    Before surgery, you can play an important role. Because skin is not sterile, your skin needs to be as free of germs as possible. You can reduce the number of germs on your skin by using the following products. . Benzoyl Peroxide Gel o Reduces the number of germs present on the skin o Applied twice a day to shoulder area starting two days before surgery    ==================================================================  Please follow these instructions carefully:  BENZOYL PEROXIDE 5% GEL  Please do not use if you have an allergy to benzoyl peroxide.   If your skin becomes reddened/irritated stop using the benzoyl peroxide.  Starting two days before surgery, apply as follows: 1. Apply benzoyl peroxide in the morning and at night. Apply after taking a shower. If you are not taking a shower clean entire shoulder front, back, and side along with the armpit with a clean wet washcloth.  2. Place a quarter-sized dollop on your shoulder and rub in thoroughly, making sure to cover the front, back, and side of your shoulder, along with the armpit.   2 days before ____ AM   ____ PM              1 day before ____ AM   ____ PM                         3. Do this twice  a day for two days.  (Last application is the night before surgery, AFTER using the CHG soap as described below).  4. Do NOT apply benzoyl peroxide gel on the day of surgery.   [place] - Preparing for Surgery Before surgery, you can play an important role.  Because skin is not sterile, your skin needs to be as free of germs as possible.  You can reduce the number of germs on your skin by washing with CHG (chlorahexidine gluconate) soap before surgery.  CHG is an antiseptic cleaner which kills germs and bonds with the skin to continue killing germs even after washing. Please DO NOT use if you have an allergy to CHG or antibacterial soaps.  If your skin becomes reddened/irritated stop using the CHG and inform your nurse when you arrive at Short Stay. Do not shave (including legs and underarms) for at least 48 hours prior to the first CHG shower.  You may shave your face/neck.  Please follow  these instructions carefully:  1.  Shower with CHG Soap the night before surgery and the  morning of surgery.  2.  If you choose to wash your hair, wash your hair first as usual with your normal  shampoo.  3.  After you shampoo, rinse your hair and body thoroughly to remove the shampoo.                             4.  Use CHG as you would any other liquid soap.  You can apply chg directly to the skin and wash.  Gently with a scrungie or clean washcloth.  5.  Apply the CHG Soap to your body ONLY FROM THE NECK DOWN.   Do   not use on face/ open                           Wound or open sores. Avoid contact with eyes, ears mouth and   genitals (private parts).                       Wash face,  Genitals (private parts) with your normal soap.             6.  Wash thoroughly, paying special attention to the area where your    surgery  will be performed.  7.  Thoroughly rinse your body with warm water from the neck down.  8.  DO NOT shower/wash with your normal soap after using and rinsing off the CHG Soap.                 9.  Pat yourself dry with a clean towel.            10.  Wear clean pajamas.            11.  Place clean sheets on your bed the night of your first shower and do not  sleep with pets. Day of Surgery : Do not apply any lotions/deodorants the morning of surgery.  Please wear clean clothes to the hospital/surgery center.  FAILURE TO FOLLOW THESE INSTRUCTIONS MAY RESULT IN THE CANCELLATION OF YOUR SURGERY  PATIENT SIGNATURE_________________________________  NURSE SIGNATURE__________________________________  ________________________________________________________________________   Alex Wilson  An incentive spirometer is a tool that can help keep your lungs clear and active. This tool measures how well you are filling your lungs with each breath. Taking long deep breaths may help reverse or decrease the chance of developing breathing (pulmonary) problems (especially infection) following:  A long period of time when you are unable to move or be active. BEFORE THE PROCEDURE   If the spirometer includes an indicator to show your best effort, your nurse or respiratory therapist will set it to a desired goal.  If possible, sit up straight or lean slightly forward. Try not to slouch.  Hold the incentive spirometer in an upright position. INSTRUCTIONS FOR USE  1. Sit on the edge of your bed if possible, or sit up as far as you can in bed or on a chair. 2. Hold the incentive spirometer in an upright position. 3. Breathe out normally. 4. Place the mouthpiece in your mouth and seal your lips tightly around it. 5. Breathe in slowly and as deeply as possible, raising the piston or the ball toward the top of the column. 6. Hold your breath for 3-5 seconds or for as long  as possible. Allow the piston or ball to fall to the bottom of the column. 7. Remove the mouthpiece from your mouth and breathe out normally. 8. Rest for a few seconds and repeat Steps 1 through 7 at least 10 times  every 1-2 hours when you are awake. Take your time and take a few normal breaths between deep breaths. 9. The spirometer may include an indicator to show your best effort. Use the indicator as a goal to work toward during each repetition. 10. After each set of 10 deep breaths, practice coughing to be sure your lungs are clear. If you have an incision (the cut made at the time of surgery), support your incision when coughing by placing a pillow or rolled up towels firmly against it. Once you are able to get out of bed, walk around indoors and cough well. You may stop using the incentive spirometer when instructed by your caregiver.  RISKS AND COMPLICATIONS  Take your time so you do not get dizzy or light-headed.  If you are in pain, you may need to take or ask for pain medication before doing incentive spirometry. It is harder to take a deep breath if you are having pain. AFTER USE  Rest and breathe slowly and easily.  It can be helpful to keep track of a log of your progress. Your caregiver can provide you with a simple table to help with this. If you are using the spirometer at home, follow these instructions: SEEK MEDICAL CARE IF:   You are having difficultly using the spirometer.  You have trouble using the spirometer as often as instructed.  Your pain medication is not giving enough relief while using the spirometer.  You develop fever of 100.5 F (38.1 C) or higher. SEEK IMMEDIATE MEDICAL CARE IF:   You cough up bloody sputum that had not been present before.  You develop fever of 102 F (38.9 C) or greater.  You develop worsening pain at or near the incision site. MAKE SURE YOU:   Understand these instructions.  Will watch your condition.  Will get help right away if you are not doing well or get worse. Document Released: 03/10/2007 Document Revised: 01/20/2012 Document Reviewed: 05/11/2007 [facility] Patient Information 2014 [employer] [place]  [place].   ________________________________________________________________________

## 2020-07-14 NOTE — Progress Notes (Addendum)
COVID Vaccine Completed:  No Date COVID Vaccine completed: COVID vaccine manufacturer: [employer]   PCP - Delynn Flavin, DO Cardiologist - Verdis Prime, MD.  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 Patient was also instructed that they will need to review over the PAT instructions again at home before surgery.

## 2020-07-19 ENCOUNTER — Encounter (HOSPITAL_COMMUNITY): Payer: Self-pay

## 2020-07-19 ENCOUNTER — Encounter (HOSPITAL_COMMUNITY)
Admission: RE | Admit: 2020-07-19 | Discharge: 2020-07-19 | Disposition: A | Discharge status: Home / Self Care | Payer: BLUE CROSS/BLUE SHIELD | Source: Ambulatory Visit | Attending: Orthopedic Surgery | Admitting: Orthopedic Surgery

## 2020-07-19 ENCOUNTER — Other Ambulatory Visit: Payer: Self-pay

## 2020-07-19 DIAGNOSIS — Z01812 Encounter for preprocedural laboratory examination: Secondary | ICD-10-CM | POA: Insufficient documentation

## 2020-07-19 LAB — CBC
HCT: 44.3 % (ref 39.0–52.0)
Hemoglobin: 15.3 g/dL (ref 13.0–17.0)
MCH: 30.5 pg (ref 26.0–34.0)
MCHC: 34.5 g/dL (ref 30.0–36.0)
MCV: 88.2 fL (ref 80.0–100.0)
Platelets: 280 10*3/uL (ref 150–400)
RBC: 5.02 MIL/uL (ref 4.22–5.81)
RDW: 12.1 % (ref 11.5–15.5)
WBC: 6.9 10*3/uL (ref 4.0–10.5)
nRBC: 0 % (ref 0.0–0.2)

## 2020-07-19 LAB — BASIC METABOLIC PANEL
Anion gap: 11 (ref 5–15)
BUN: 13 mg/dL (ref 6–20)
CO2: 25 mmol/L (ref 22–32)
Calcium: 9.5 mg/dL (ref 8.9–10.3)
Chloride: 105 mmol/L (ref 98–111)
Creatinine, Ser: 0.73 mg/dL (ref 0.61–1.24)
GFR calc Af Amer: >60 mL/min (ref >60)
GFR calc non Af Amer: >60 mL/min (ref >60)
Glucose, Bld: 79 mg/dL (ref 70–99)
Potassium: 4.5 mmol/L (ref 3.5–5.1)
Sodium: 141 mmol/L (ref 135–145)

## 2020-07-19 LAB — SURGICAL PCR SCREEN
MRSA, PCR: NEGATIVE
Staphylococcus aureus: NEGATIVE

## 2020-07-20 ENCOUNTER — Encounter (HOSPITAL_COMMUNITY): Payer: Self-pay | Admitting: Physician Assistant

## 2020-07-21 ENCOUNTER — Other Ambulatory Visit (HOSPITAL_COMMUNITY)
Admission: RE | Admit: 2020-07-21 | Discharge: 2020-07-21 | Disposition: A | Discharge status: Home / Self Care | Payer: 59 | Source: Ambulatory Visit | Attending: Orthopedic Surgery | Admitting: Orthopedic Surgery

## 2020-07-21 DIAGNOSIS — Z20822 Contact with and (suspected) exposure to covid-19: Secondary | ICD-10-CM | POA: Diagnosis not present

## 2020-07-21 DIAGNOSIS — Z01812 Encounter for preprocedural laboratory examination: Secondary | ICD-10-CM | POA: Insufficient documentation

## 2020-07-21 LAB — SARS CORONAVIRUS 2 (TAT 6-24 HRS): SARS Coronavirus 2: NEGATIVE

## 2020-08-07 ENCOUNTER — Telehealth: Payer: Self-pay | Admitting: *Deleted

## 2020-08-07 NOTE — Telephone Encounter (Signed)
    [facility] Pre-operative Risk Assessment    HEARTCARE STAFF: - Please ensure there is not already an duplicate clearance open for this procedure. - Under Visit Info/Reason for Call, type in Other and utilize the format Clearance MM/DD/YY or Clearance TBD. Do not use dashes or single digits. - If request is for dental extraction, please clarify the # of teeth to be extracted.  Request for surgical clearance:  1. What type of surgery is being performed? LEFT REVERSE TOTAL SHOULDER ARTHROPLASTY   2. When is this surgery scheduled? 08/18/20   3. What type of clearance is required (medical clearance vs. Pharmacy clearance to hold med vs. Both)? MEDICAL  4. Are there any medications that need to be held prior to surgery and how long? NONE LISTED    5. Practice name and name of physician performing surgery? [facility]; DR. Corene Cornea ROGERS   6. What is the office phone number? [phone]   7.   What is the office fax number? [phone]  8.   Anesthesia type (None, local, MAC, general) ? CHOICE   Alex Wilson 08/07/2020, 11:16 AM  _________________________________________________________________   (provider comments below)

## 2020-08-07 NOTE — Patient Instructions (Addendum)
DUE TO COVID-19 ONLY ONE VISITOR IS ALLOWED TO COME WITH YOU AND STAY IN THE WAITING ROOM ONLY DURING PRE OP AND PROCEDURE DAY OF SURGERY. THE 1 VISITOR  MAY VISIT WITH YOU AFTER SURGERY IN YOUR PRIVATE ROOM DURING VISITING HOURS ONLY!  YOU NEED TO HAVE A COVID 19 TEST ON_10/5______ @1 :05_______, THIS TEST MUST BE DONE BEFORE SURGERY,  COVID TESTING SITE [address] [place] , IT IS ON THE RIGHT GOING OUT [address] APPROXIMATELY  2 MINUTES PAST [employer] ON THE RIGHT. ONCE YOUR COVID TEST IS COMPLETED,  PLEASE BEGIN THE QUARANTINE INSTRUCTIONS AS OUTLINED IN YOUR HANDOUT.                Alex Wilson    Your procedure is scheduled on: 08/18/20   Report to [facility] Main  Entrance   Report to admitting at  10:00 AM     Call this number if you have problems the morning of surgery [phone]   . BRUSH YOUR TEETH MORNING OF SURGERY AND RINSE YOUR MOUTH OUT, NO CHEWING GUM CANDY OR MINTS.  No food after midnight.    You may have clear liquid until 9:30 AM.    At 9:30 AM drink pre surgery drink  . Nothing by mouth after 9:30 AM.    Take these medicines the morning of surgery with A SIP OF WATER: Patoprazole                                 You may not have any metal on your body including              piercings  Do not wear jewelry,  lotions, powders or deodorant                         Men may shave face and neck.   Do not bring valuables to the hospital. [facility] IS NOT             RESPONSIBLE   FOR VALUABLES.  Contacts, dentures or bridgework may not be worn into surgery.       Patients discharged the day of surgery will not be allowed to drive home  . IF YOU ARE HAVING SURGERY AND GOING HOME THE SAME DAY, YOU MUST HAVE AN ADULT TO DRIVE YOU HOME AND BE WITH YOU FOR 24 HOURS.   YOU MAY GO HOME BY TAXI OR UBER OR ORTHERWISE, BUT AN ADULT MUST ACCOMPANY YOU HOME AND STAY WITH YOU FOR 24 HOURS.  Name and phone number of your  driver:  Special Instructions: N/A              Please read over the following fact sheets you were given: _____________________________________________________________________             [facility]- Preparing for Total Shoulder Arthroplasty    Before surgery, you can play an important role. Because skin is not sterile, your skin needs to be as free of germs as possible. You can reduce the number of germs on your skin by using the following products.  Benzoyl Peroxide Gel o Reduces the number of germs present on the skin o Applied twice a day to shoulder area starting two days before surgery    ==================================================================  Please follow these instructions carefully:  BENZOYL PEROXIDE 5% GEL  Please do not use if  you have an allergy to benzoyl peroxide.   If your skin becomes reddened/irritated stop using the benzoyl peroxide.  Starting two days before surgery, apply as follows: 1. Apply benzoyl peroxide in the morning and at night. Apply after taking a shower. If you are not taking a shower clean entire shoulder front, back, and side along with the armpit with a clean wet washcloth.  2. Place a quarter-sized dollop on your shoulder and rub in thoroughly, making sure to cover the front, back, and side of your shoulder, along with the armpit.   2 days before ____ AM   ____ PM              1 day before ____ AM   ____ PM                         3. Do this twice a day for two days.  (Last application is the night before surgery, AFTER using the CHG soap as described below).  4. Do NOT apply benzoyl peroxide gel on the day of surgery. 5.  [facility] - Preparing for Surgery  Before surgery, you can play an important role .  Because skin is not sterile, your skin needs to be as free of germs as possible.   You can reduce the number of germs on your skin by washing with CHG (chlorahexidine gluconate) soap before surgery.   CHG is an antiseptic  cleaner which kills germs and bonds with the skin to continue killing germs even after washing. Please DO NOT use if you have an allergy to CHG or antibacterial soaps.   If your skin becomes reddened/irritated stop using the CHG and inform your nurse when you arrive at Short Stay.  You may shave your face/neck.  Please follow these instructions carefully:  1.  Shower with CHG Soap the night before surgery and the  morning of Surgery.  2.  If you choose to wash your hair, wash your hair first as usual with your  normal  shampoo.  3.  After you shampoo, rinse your hair and body thoroughly to remove the  shampoo.                                        4.  Use CHG as you would any other liquid soap.  You can apply chg directly  to the skin and wash                       Gently with a scrungie or clean washcloth.  5.  Apply the CHG Soap to your body ONLY FROM THE NECK DOWN.   Do not use on face/ open                           Wound or open sores. Avoid contact with eyes, ears mouth and genitals (private parts).                       Wash face,  Genitals (private parts) with your normal soap.             6.  Wash thoroughly, paying special attention to the area where your surgery  will be performed.  7.  Thoroughly rinse your body with warm water from the neck  down.  8.  DO NOT shower/wash with your normal soap after using and rinsing off  the CHG Soap.             9.  Pat yourself dry with a clean towel.            10.  Wear clean pajamas.            11.  Place clean sheets on your bed the night of your first shower and do not  sleep with pets. Day of Surgery : Do not apply any lotions/deodorants the morning of surgery.  Please wear clean clothes to the hospital/surgery center.  FAILURE TO FOLLOW THESE INSTRUCTIONS MAY RESULT IN THE CANCELLATION OF YOUR SURGERY PATIENT SIGNATURE_________________________________  NURSE  SIGNATURE__________________________________  ________________________________________________________________________   Alex Wilson  An incentive spirometer is a tool that can help keep your lungs clear and active. This tool measures how well you are filling your lungs with each breath. Taking long deep breaths may help reverse or decrease the chance of developing breathing (pulmonary) problems (especially infection) following:  A long period of time when you are unable to move or be active. BEFORE THE PROCEDURE   If the spirometer includes an indicator to show your best effort, your nurse or respiratory therapist will set it to a desired goal.  If possible, sit up straight or lean slightly forward. Try not to slouch.  Hold the incentive spirometer in an upright position. INSTRUCTIONS FOR USE  1. Sit on the edge of your bed if possible, or sit up as far as you can in bed or on a chair. 2. Hold the incentive spirometer in an upright position. 3. Breathe out normally. 4. Place the mouthpiece in your mouth and seal your lips tightly around it. 5. Breathe in slowly and as deeply as possible, raising the piston or the ball toward the top of the column. 6. Hold your breath for 3-5 seconds or for as long as possible. Allow the piston or ball to fall to the bottom of the column. 7. Remove the mouthpiece from your mouth and breathe out normally. 8. Rest for a few seconds and repeat Steps 1 through 7 at least 10 times every 1-2 hours when you are awake. Take your time and take a few normal breaths between deep breaths. 9. The spirometer may include an indicator to show your best effort. Use the indicator as a goal to work toward during each repetition. 10. After each set of 10 deep breaths, practice coughing to be sure your lungs are clear. If you have an incision (the cut made at the time of surgery), support your incision when coughing by placing a pillow or rolled up towels firmly  against it. Once you are able to get out of bed, walk around indoors and cough well. You may stop using the incentive spirometer when instructed by your caregiver.  RISKS AND COMPLICATIONS  Take your time so you do not get dizzy or light-headed.  If you are in pain, you may need to take or ask for pain medication before doing incentive spirometry. It is harder to take a deep breath if you are having pain. AFTER USE  Rest and breathe slowly and easily.  It can be helpful to keep track of a log of your progress. Your caregiver can provide you with a simple table to help with this. If you are using the spirometer at home, follow these instructions: SEEK MEDICAL CARE IF:   You  are having difficultly using the spirometer.  You have trouble using the spirometer as often as instructed.  Your pain medication is not giving enough relief while using the spirometer.  You develop fever of 100.5 F (38.1 C) or higher. SEEK IMMEDIATE MEDICAL CARE IF:   You cough up bloody sputum that had not been present before.  You develop fever of 102 F (38.9 C) or greater.  You develop worsening pain at or near the incision site. MAKE SURE YOU:   Understand these instructions.  Will watch your condition.  Will get help right away if you are not doing well or get worse. Document Released: 03/10/2007 Document Revised: 01/20/2012 Document Reviewed: 05/11/2007 [facility] Patient Information 2014 [employer] [place].   ________________________________________________________________________

## 2020-08-08 NOTE — Telephone Encounter (Signed)
Patient has recently established care with Dr. Katrinka Blazing 06/09/20 for aortic stenosis. Per note " May consider doing a TEE at some point in the future"  CT angio of chest aorta: 1. The ascending thoracic aorta is near the upper limit of normal in size with a maximum diameter of 3.8 cm. 2. Mild diffuse hepatic steatosis.   Dr. Katrinka Blazing, Does patient needs TEE and give your recommendations about clearance? Please forward your response to P CV DIV PREOP.   Thank you

## 2020-08-09 ENCOUNTER — Other Ambulatory Visit: Payer: Self-pay

## 2020-08-09 ENCOUNTER — Encounter (HOSPITAL_COMMUNITY)
Admission: RE | Admit: 2020-08-09 | Discharge: 2020-08-09 | Disposition: A | Discharge status: Home / Self Care | Payer: BLUE CROSS/BLUE SHIELD | Source: Ambulatory Visit | Attending: Orthopedic Surgery | Admitting: Orthopedic Surgery

## 2020-08-09 ENCOUNTER — Encounter (HOSPITAL_COMMUNITY): Payer: Self-pay

## 2020-08-09 DIAGNOSIS — Z01812 Encounter for preprocedural laboratory examination: Secondary | ICD-10-CM | POA: Insufficient documentation

## 2020-08-09 LAB — CBC
HCT: 43.4 % (ref 39.0–52.0)
Hemoglobin: 15.1 g/dL (ref 13.0–17.0)
MCH: 30.8 pg (ref 26.0–34.0)
MCHC: 34.8 g/dL (ref 30.0–36.0)
MCV: 88.4 fL (ref 80.0–100.0)
Platelets: 245 10*3/uL (ref 150–400)
RBC: 4.91 MIL/uL (ref 4.22–5.81)
RDW: 12.1 % (ref 11.5–15.5)
WBC: 6.1 10*3/uL (ref 4.0–10.5)
nRBC: 0 % (ref 0.0–0.2)

## 2020-08-09 LAB — BASIC METABOLIC PANEL
Anion gap: 10 (ref 5–15)
BUN: 11 mg/dL (ref 6–20)
CO2: 25 mmol/L (ref 22–32)
Calcium: 9.3 mg/dL (ref 8.9–10.3)
Chloride: 105 mmol/L (ref 98–111)
Creatinine, Ser: 0.78 mg/dL (ref 0.61–1.24)
GFR calc Af Amer: >60 mL/min (ref >60)
GFR calc non Af Amer: >60 mL/min (ref >60)
Glucose, Bld: 74 mg/dL (ref 70–99)
Potassium: 4 mmol/L (ref 3.5–5.1)
Sodium: 140 mmol/L (ref 135–145)

## 2020-08-09 LAB — SURGICAL PCR SCREEN
MRSA, PCR: NEGATIVE
Staphylococcus aureus: NEGATIVE

## 2020-08-09 NOTE — Progress Notes (Signed)
COVID Vaccine Completed:No Date COVID Vaccine completed: COVID vaccine manufacturer: Pfizer    [facility]   PCP - Doreen Salvage DO Cardiologist - Dr. Mendel Ryder  Chest x-ray - 2019-Epic EKG - 03/31/20-Epic Stress Test - no ECHO - 04/07/20-Epic Cardiac Cath - no Pacemaker/ICD device last checked:NA  Sleep Study - NA CPAP -   Fasting Blood Sugar - NA Checks Blood Sugar _____ times a day  Blood Thinner Instructions: Aspirin Instructions:NA Last Dose:  Anesthesia review:   Patient denies shortness of breath, fever, cough and chest pain at PAT appointment  yes   Patient verbalized understanding of instructions that were given to them at the PAT appointment. Patient was also instructed that they will need to review over the PAT instructions again at home before surgery. Yes  Pt has no SOB climbing stairs, working or with ADLs Pt has aortic stenosis and had a ct angio of chest on 08/07/20

## 2020-08-11 ENCOUNTER — Encounter (HOSPITAL_COMMUNITY): Payer: Self-pay | Admitting: Certified Registered"

## 2020-08-11 ENCOUNTER — Encounter (HOSPITAL_COMMUNITY): Payer: Self-pay | Admitting: Physician Assistant

## 2020-08-15 ENCOUNTER — Other Ambulatory Visit (HOSPITAL_COMMUNITY)
Admission: RE | Admit: 2020-08-15 | Discharge: 2020-08-15 | Disposition: A | Discharge status: Home / Self Care | Payer: 59 | Source: Ambulatory Visit | Attending: Orthopedic Surgery | Admitting: Orthopedic Surgery

## 2020-08-15 DIAGNOSIS — Z20822 Contact with and (suspected) exposure to covid-19: Secondary | ICD-10-CM | POA: Diagnosis not present

## 2020-08-15 DIAGNOSIS — Z01812 Encounter for preprocedural laboratory examination: Secondary | ICD-10-CM | POA: Diagnosis not present

## 2020-08-15 LAB — SARS CORONAVIRUS 2 (TAT 6-24 HRS): SARS Coronavirus 2: NEGATIVE

## 2020-08-15 NOTE — Telephone Encounter (Signed)
Patient returning call.

## 2020-08-15 NOTE — Telephone Encounter (Addendum)
Dr. Katrinka Blazing Please see Vin's note below. Pt is scheduled for surgery on 08/18/20.  Reading your last note, does not sound like the patient has critical AS at the time of his last echo.  CT scan did show upper limit of normal ascending aorta.  If asymptomatic, or symptoms stable, we can likely clear him for surgery. Do you agree?  Thank you Angie

## 2020-08-16 NOTE — Telephone Encounter (Signed)
We can clear if no symptoms of CHF, syncope, or angina.

## 2020-08-18 ENCOUNTER — Ambulatory Visit (HOSPITAL_COMMUNITY)
Admission: RE | Admit: 2020-08-18 | Payer: BLUE CROSS/BLUE SHIELD | Source: Home / Self Care | Admitting: Orthopedic Surgery

## 2020-08-18 LAB — TYPE AND SCREEN
ABO/RH(D): O POS
Antibody Screen: NEGATIVE

## 2020-08-18 SURGERY — ARTHROPLASTY, SHOULDER, TOTAL, REVERSE
Anesthesia: Choice | Site: Shoulder | Laterality: Left

## 2020-08-18 NOTE — Telephone Encounter (Signed)
   Primary Cardiologist: Dr. Katrinka Blazing Chart reviewed as part of pre-operative protocol coverage. Patient was contacted 08/18/2020 in reference to pre-operative risk assessment for pending surgery as outlined below.  Alex Wilson was last seen on 06/09/20 by Dr. Katrinka Blazing.  Since that day, Alex Wilson has done well.  Therefore, based on ACC/AHA guidelines, the patient would be at acceptable risk for the planned procedure without further cardiovascular testing.   The patient was advised that if he develops new symptoms prior to surgery to contact our office to arrange for a follow-up visit, and he verbalized understanding.  I will route this recommendation to the requesting party via Epic fax function and remove from pre-op pool. Please call with questions.  [place], [place] 08/18/2020, 1:46 PM

## 2020-08-24 ENCOUNTER — Telehealth (INDEPENDENT_AMBULATORY_CARE_PROVIDER_SITE_OTHER): Payer: 59 | Admitting: Family Medicine

## 2020-08-24 ENCOUNTER — Telehealth: Payer: Self-pay | Admitting: Interventional Cardiology

## 2020-08-24 DIAGNOSIS — L819 Disorder of pigmentation, unspecified: Secondary | ICD-10-CM

## 2020-08-24 NOTE — Telephone Encounter (Signed)
Pt c/o Shortness Of Breath: STAT if SOB developed within the last 24 hours or pt is noticeably SOB on the phone  1. Are you currently SOB (can you hear that pt is SOB on the phone)? no  2. How long have you been experiencing SOB? About a week  3. Are you SOB when sitting or when up moving around? anything  4. Are you currently experiencing any other symptoms? States he is also having some lightheadedness, and pain in his groin.
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
## 2020-08-24 NOTE — Telephone Encounter (Signed)
Left message for pt to c/b to discuss his concerns. 

## 2020-09-07 ENCOUNTER — Other Ambulatory Visit: Payer: Self-pay | Admitting: Family Medicine

## 2020-09-08 ENCOUNTER — Telehealth: Payer: Self-pay | Admitting: *Deleted

## 2020-09-08 NOTE — Telephone Encounter (Signed)
Pantoprazole Sodium 40mg  Dr tab non formulary on pt insurance   Using available formulary data, we have determined that the following medications may be covered:  PA Requirement Famotidine 40 Mg Tab, PA Not Required Esomeprazole Magnesium 40 Mg Cpdr,PA Not Required Lansoprazole 30 Mg Cpdr, PA Not Required Rabeprazole (Tbec), PA Not Required Nexium 20 Mg Cpdr, PA Not Required Prevacid SoluTab 30 Mg TbLD,PA Not Required Dexilant 60 Mg CpDB, PA Required Prilosec 10 Mg Sudr, PA Required Omeprazole (Tbec), PA Required Omeprazole Magnesium (Cpdr), PA Required Omeprazole (Tbec), PA Required

## 2020-09-11 NOTE — Telephone Encounter (Signed)
Patient said he was able to get med for $22 at [facility]. He has a 90 day supply.  He has a supplemental insurance that he thinks paid for it.

## 2020-11-02 ENCOUNTER — Other Ambulatory Visit: Payer: Self-pay | Admitting: Family Medicine

## 2020-12-01 ENCOUNTER — Emergency Department (HOSPITAL_COMMUNITY)
Admission: EM | Admit: 2020-12-01 | Discharge: 2020-12-01 | Disposition: A | Discharge status: Home / Self Care | Payer: 59 | Attending: Emergency Medicine | Admitting: Emergency Medicine

## 2020-12-01 ENCOUNTER — Emergency Department (HOSPITAL_COMMUNITY): Payer: 59

## 2020-12-01 ENCOUNTER — Other Ambulatory Visit: Payer: Self-pay

## 2020-12-01 DIAGNOSIS — S4992XA Unspecified injury of left shoulder and upper arm, initial encounter: Secondary | ICD-10-CM | POA: Diagnosis present

## 2020-12-01 DIAGNOSIS — W009XXA Unspecified fall due to ice and snow, initial encounter: Secondary | ICD-10-CM | POA: Diagnosis not present

## 2020-12-01 DIAGNOSIS — S43015A Anterior dislocation of left humerus, initial encounter: Secondary | ICD-10-CM | POA: Insufficient documentation

## 2020-12-01 DIAGNOSIS — S43005A Unspecified dislocation of left shoulder joint, initial encounter: Secondary | ICD-10-CM

## 2020-12-01 DIAGNOSIS — W19XXXA Unspecified fall, initial encounter: Secondary | ICD-10-CM

## 2020-12-01 MED ORDER — PROPOFOL 10 MG/ML IV BOLUS
0.5000 mg/kg | Freq: Once | INTRAVENOUS | Status: AC
  Administered 2020-12-01: 42 mg via INTRAVENOUS
  Filled 2020-12-01: qty 20

## 2020-12-01 NOTE — ED Notes (Signed)
E consent signed & witnessed by this RN

## 2020-12-01 NOTE — Sedation Documentation (Signed)
80mg  propofol administered by MD 

## 2020-12-01 NOTE — Progress Notes (Signed)
Orthopedic Tech Progress Note Patient Details:  Alex Wilson 06/03/1966 [id number]  Ortho Devices Type of Ortho Device: Shoulder immobilizer Ortho Device/Splint Location: LUE Ortho Device/Splint Interventions: Application,Adjustment   Post Interventions Patient Tolerated: Well   [name] [name] 12/01/2020, 8:57 PM

## 2020-12-01 NOTE — ED Provider Notes (Signed)
  Physical Exam  BP 139/90   Pulse 75   Resp 14   Ht 5\' 7"  (1.702 m)   Wt 83.9 kg   SpO2 95%   BMI 28.98 kg/m   ED Course/Procedures     Reduction of dislocation  Date/Time: 12/01/2020 8:40 PM Performed by: 12/03/2020, MD Authorized by: Kathleen Lime, MD  Consent: Written consent obtained. Risks and benefits: risks, benefits and alternatives were discussed Consent given by: patient Imaging studies: imaging studies available Patient identity confirmed: arm band Time out: Immediately prior to procedure a "time out" was called to verify the correct patient, procedure, equipment, support staff and site/side marked as required. Local anesthesia used: no  Anesthesia: Local anesthesia used: no  Sedation: Patient sedated: yes Sedation type: moderate (conscious) sedation Sedatives: propofol Analgesia: fentanyl Vitals: Vital signs were monitored during sedation.  Patient tolerance: patient tolerated the procedure well with no immediate complications Comments: Following appropriate sedation and analgesia, a sheath was placed around the patient's left side for countertraction which was applied.  The left arm was mobilized, using long-arm traction, the arm was slowly abducted and externally rotated until shoulder was reduced into appropriate alignment.  The arm was then abducted and flexed at the elbow as the patient was placed in a sling, no dislocation defect of the shoulder was noted.  Following reduction and sedation, patient is neurovascularly intact, axillary nerve sensation is normal.  2+ radial pulse.     MDM         Tilden Fossa, MD 12/01/20 12/03/20    [id number], MD 12/02/20 1440
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
## 2020-12-01 NOTE — ED Triage Notes (Signed)
Pt arrives via [facility] for eval of L shoulder injury after slipping on ice. Obvious deformity noted to shoulder, no other injuries. fentanyl en route  20g R FA

## 2020-12-01 NOTE — ED Notes (Signed)
Pt A&O4, states he has a friend coming to sit w him that will be able to take him home upon discharge. No complaints at this time.

## 2020-12-01 NOTE — ED Notes (Signed)
Pt able to tolerate food & drink well. O2 sats remain 96% & above on RA

## 2021-01-04 NOTE — Patient Instructions (Addendum)
DUE TO COVID-19 ONLY ONE VISITOR IS ALLOWED TO COME WITH YOU AND STAY IN THE WAITING ROOM ONLY DURING PRE OP AND PROCEDURE DAY OF SURGERY. THE 1 VISITOR  MAY VISIT WITH YOU AFTER SURGERY IN YOUR PRIVATE ROOM DURING VISITING HOURS ONLY!  YOU NEED TO HAVE A COVID 19 TEST ON__3/1_____ @__10 :10_____, THIS TEST MUST BE DONE BEFORE SURGERY,  COVID TESTING SITE [address] [place] , IT IS ON THE RIGHT GOING OUT [address] APPROXIMATELY  2 MINUTES PAST [employer] ON THE RIGHT. ONCE YOUR COVID TEST IS COMPLETED,  PLEASE BEGIN THE QUARANTINE INSTRUCTIONS AS OUTLINED IN YOUR HANDOUT.                WILLAM MUNFORD    Your procedure is scheduled on: 01/12/21   Report to [facility] Main  Entrance   Report to admitting at   10:00 AM     Call this number if you have problems the morning of surgery [phone]    BRUSH YOUR TEETH MORNING OF SURGERY AND RINSE YOUR MOUTH OUT, NO CHEWING GUM CANDY OR MINTS.   No food after midnight.    You may have clear liquid until 9:30 AM.    At 9:00 AM drink pre surgery drink.   Nothing by mouth after 9:30 AM.    Take these medicines the morning of surgery with A SIP OF WATER: Pantoprazole                                 You may not have any metal on your body including              piercings  Do not wear jewelry,  lotions, powders or deodorant                          Men may shave face and neck.   Do not bring valuables to the hospital. [facility] IS NOT             RESPONSIBLE   FOR VALUABLES.  Contacts, dentures or bridgework may not be worn into surgery.      Patients discharged the day of surgery will not be allowed to drive home.   IF YOU ARE HAVING SURGERY AND GOING HOME THE SAME DAY, YOU MUST HAVE AN ADULT TO DRIVE YOU HOME AND BE WITH YOU FOR 24 HOURS.  YOU MAY GO HOME BY TAXI OR UBER OR ORTHERWISE, BUT AN ADULT MUST ACCOMPANY YOU HOME AND STAY WITH YOU FOR 24 HOURS.  Name and phone number of your  driver:  Special Instructions: N/A              Please read over the following fact sheets you were given: _____________________________________________________________________             [facility]- Preparing for Total Shoulder Arthroplasty    Before surgery, you can play an important role. Because skin is not sterile, your skin needs to be as free of germs as possible. You can reduce the number of germs on your skin by using the following products. . Benzoyl Peroxide Gel o Reduces the number of germs present on the skin o Applied twice a day to shoulder area starting two days before surgery    ==================================================================  Please follow these instructions carefully:  BENZOYL PEROXIDE 5% GEL  Please do not use  if you have an allergy to benzoyl peroxide.   If your skin becomes reddened/irritated stop using the benzoyl peroxide.  Starting two days before surgery, apply as follows: 1. Apply benzoyl peroxide in the morning and at night. Apply after taking a shower. If you are not taking a shower clean entire shoulder front, back, and side along with the armpit with a clean wet washcloth.  2. Place a quarter-sized dollop on your shoulder and rub in thoroughly, making sure to cover the front, back, and side of your shoulder, along with the armpit.   2 days before ____ AM   ____ PM              1 day before ____ AM   ____ PM                         3. Do this twice a day for two days.  (Last application is the night before surgery, AFTER using the CHG soap as described below).  4. Do NOT apply benzoyl peroxide gel on the day of surgery.   [facility] - Preparing for Surgery Before surgery, you can play an important role.  Because skin is not sterile, your skin needs to be as free of germs as possible.  You can reduce the number of germs on your skin by washing with CHG (chlorahexidine gluconate) soap before surgery.  CHG is an antiseptic cleaner  which kills germs and bonds with the skin to continue killing germs even after washing. Please DO NOT use if you have an allergy to CHG or antibacterial soaps.  If your skin becomes reddened/irritated stop using the CHG and inform your nurse when you arrive at Short Stay  You may shave your face/neck.  Please follow these instructions carefully:  1.  Shower with CHG Soap the night before surgery and the  morning of Surgery.  2.  If you choose to wash your hair, wash your hair first as usual with your  normal  shampoo.  3.  After you shampoo, rinse your hair and body thoroughly to remove the  shampoo.                                        4.  Use CHG as you would any other liquid soap.  You can apply chg directly  to the skin and wash                       Gently with a scrungie or clean washcloth.  5.  Apply the CHG Soap to your body ONLY FROM THE NECK DOWN.   Do not use on face/ open                           Wound or open sores. Avoid contact with eyes, ears mouth and genitals (private parts).                       Wash face,  Genitals (private parts) with your normal soap.             6.  Wash thoroughly, paying special attention to the area where your surgery  will be performed.  7.  Thoroughly rinse your body with warm water from the neck down.  8.  DO NOT shower/wash with your normal soap after using and rinsing off  the CHG Soap.             9.  Pat yourself dry with a clean towel.            10.  Wear clean pajamas.            11.  Place clean sheets on your bed the night of your first shower and do not  sleep with pets. Day of Surgery : Do not apply any lotions/deodorants the morning of surgery.  Please wear clean clothes to the hospital/surgery center.  FAILURE TO FOLLOW THESE INSTRUCTIONS MAY RESULT IN THE CANCELLATION OF YOUR SURGERY PATIENT SIGNATURE_________________________________  NURSE  SIGNATURE__________________________________  ________________________________________________________________________   Rogelia Mire  An incentive spirometer is a tool that can help keep your lungs clear and active. This tool measures how well you are filling your lungs with each breath. Taking long deep breaths may help reverse or decrease the chance of developing breathing (pulmonary) problems (especially infection) following:  A long period of time when you are unable to move or be active. BEFORE THE PROCEDURE   If the spirometer includes an indicator to show your best effort, your nurse or respiratory therapist will set it to a desired goal.  If possible, sit up straight or lean slightly forward. Try not to slouch.  Hold the incentive spirometer in an upright position. INSTRUCTIONS FOR USE  1. Sit on the edge of your bed if possible, or sit up as far as you can in bed or on a chair. 2. Hold the incentive spirometer in an upright position. 3. Breathe out normally. 4. Place the mouthpiece in your mouth and seal your lips tightly around it. 5. Breathe in slowly and as deeply as possible, raising the piston or the ball toward the top of the column. 6. Hold your breath for 3-5 seconds or for as long as possible. Allow the piston or ball to fall to the bottom of the column. 7. Remove the mouthpiece from your mouth and breathe out normally. 8. Rest for a few seconds and repeat Steps 1 through 7 at least 10 times every 1-2 hours when you are awake. Take your time and take a few normal breaths between deep breaths. 9. The spirometer may include an indicator to show your best effort. Use the indicator as a goal to work toward during each repetition. 10. After each set of 10 deep breaths, practice coughing to be sure your lungs are clear. If you have an incision (the cut made at the time of surgery), support your incision when coughing by placing a pillow or rolled up towels firmly  against it. Once you are able to get out of bed, walk around indoors and cough well. You may stop using the incentive spirometer when instructed by your caregiver.  RISKS AND COMPLICATIONS  Take your time so you do not get dizzy or light-headed.  If you are in pain, you may need to take or ask for pain medication before doing incentive spirometry. It is harder to take a deep breath if you are having pain. AFTER USE  Rest and breathe slowly and easily.  It can be helpful to keep track of a log of your progress. Your caregiver can provide you with a simple table to help with this. If you are using the spirometer at home, follow these instructions: SEEK MEDICAL CARE IF:   You are having difficultly using  the spirometer.  You have trouble using the spirometer as often as instructed.  Your pain medication is not giving enough relief while using the spirometer.  You develop fever of 100.5 F (38.1 C) or higher. SEEK IMMEDIATE MEDICAL CARE IF:   You cough up bloody sputum that had not been present before.  You develop fever of 102 F (38.9 C) or greater.  You develop worsening pain at or near the incision site. MAKE SURE YOU:   Understand these instructions.  Will watch your condition.  Will get help right away if you are not doing well or get worse. Document Released: 03/10/2007 Document Revised: 01/20/2012 Document Reviewed: 05/11/2007 [facility] Patient Information 2014 [place], [place].   ________________________________________________________________________

## 2021-01-05 ENCOUNTER — Encounter (HOSPITAL_COMMUNITY): Payer: Self-pay

## 2021-01-05 ENCOUNTER — Other Ambulatory Visit: Payer: Self-pay

## 2021-01-05 ENCOUNTER — Encounter (HOSPITAL_COMMUNITY)
Admission: RE | Admit: 2021-01-05 | Discharge: 2021-01-05 | Disposition: A | Discharge status: Home / Self Care | Payer: 59 | Source: Ambulatory Visit | Attending: Orthopedic Surgery | Admitting: Orthopedic Surgery

## 2021-01-05 DIAGNOSIS — Z01812 Encounter for preprocedural laboratory examination: Secondary | ICD-10-CM | POA: Diagnosis present

## 2021-01-05 LAB — COMPREHENSIVE METABOLIC PANEL
ALT: 26 U/L (ref 0–44)
AST: 19 U/L (ref 15–41)
Albumin: 4.4 g/dL (ref 3.5–5.0)
Alkaline Phosphatase: 56 U/L (ref 38–126)
Anion gap: 9 (ref 5–15)
BUN: 11 mg/dL (ref 6–20)
CO2: 24 mmol/L (ref 22–32)
Calcium: 9.3 mg/dL (ref 8.9–10.3)
Chloride: 105 mmol/L (ref 98–111)
Creatinine, Ser: 0.67 mg/dL (ref 0.61–1.24)
GFR, Estimated: >60 mL/min (ref >60)
Glucose, Bld: 89 mg/dL (ref 70–99)
Potassium: 4.5 mmol/L (ref 3.5–5.1)
Sodium: 138 mmol/L (ref 135–145)
Total Bilirubin: 0.8 mg/dL (ref 0.3–1.2)
Total Protein: 7.6 g/dL (ref 6.5–8.1)

## 2021-01-05 LAB — CBC
HCT: 43.9 % (ref 39.0–52.0)
Hemoglobin: 15.2 g/dL (ref 13.0–17.0)
MCH: 30.5 pg (ref 26.0–34.0)
MCHC: 34.6 g/dL (ref 30.0–36.0)
MCV: 88 fL (ref 80.0–100.0)
Platelets: 239 10*3/uL (ref 150–400)
RBC: 4.99 MIL/uL (ref 4.22–5.81)
RDW: 12.2 % (ref 11.5–15.5)
WBC: 5.9 10*3/uL (ref 4.0–10.5)
nRBC: 0 % (ref 0.0–0.2)

## 2021-01-05 LAB — SURGICAL PCR SCREEN
MRSA, PCR: NEGATIVE
Staphylococcus aureus: NEGATIVE

## 2021-01-05 NOTE — Progress Notes (Signed)
COVID Vaccine Completed:no Date COVID Vaccine completed: COVID vaccine manufacturer: Pfizer    Moderna   [employer]   PCP - Delynn Flavin Cardiologist - no  Chest x-ray - no EKG - 12/01/20-epic Stress Test - no ECHO - 04/07/20-epic Cardiac Cath - no Pacemaker/ICD device last checked:NA  Sleep Study - no CPAP -   Fasting Blood Sugar - na Checks Blood Sugar _____ times a day  Blood Thinner Instructions:NA Aspirin Instructions: Last Dose:  Anesthesia review:   Patient denies shortness of breath, fever, cough and chest pain at PAT appointment  yes Patient verbalized understanding of instructions that were given to them at the PAT appointment. Patient was also instructed that they will need to review over the PAT instructions again at home before surgery. Yes Pt has no SOB climbing stairs, doing work or with ADLs.

## 2021-01-09 ENCOUNTER — Other Ambulatory Visit: Payer: Self-pay | Admitting: Family Medicine

## 2021-01-09 ENCOUNTER — Other Ambulatory Visit (HOSPITAL_COMMUNITY)
Admission: RE | Admit: 2021-01-09 | Discharge: 2021-01-09 | Disposition: A | Discharge status: Home / Self Care | Payer: 59 | Source: Ambulatory Visit | Attending: Orthopedic Surgery | Admitting: Orthopedic Surgery

## 2021-01-09 DIAGNOSIS — Z01812 Encounter for preprocedural laboratory examination: Secondary | ICD-10-CM | POA: Insufficient documentation

## 2021-01-09 DIAGNOSIS — Z20822 Contact with and (suspected) exposure to covid-19: Secondary | ICD-10-CM | POA: Diagnosis not present

## 2021-01-09 LAB — SARS CORONAVIRUS 2 (TAT 6-24 HRS): SARS Coronavirus 2: NEGATIVE

## 2021-01-09 NOTE — Progress Notes (Signed)
Prior to pt being tested for covid he stated that he tested + for covid via a home test on 12/01/20. Pt advised that he will need testing and the decision to proceed will be up to the ordering provider based on the results.

## 2021-01-12 ENCOUNTER — Ambulatory Visit (HOSPITAL_COMMUNITY): Payer: 59 | Admitting: Anesthesiology

## 2021-01-12 ENCOUNTER — Ambulatory Visit (HOSPITAL_COMMUNITY)
Admission: RE | Admit: 2021-01-12 | Discharge: 2021-01-12 | Disposition: A | Discharge status: Home / Self Care | Payer: 59 | Attending: Orthopedic Surgery | Admitting: Orthopedic Surgery

## 2021-01-12 ENCOUNTER — Ambulatory Visit (HOSPITAL_COMMUNITY): Payer: 59

## 2021-01-12 ENCOUNTER — Encounter (HOSPITAL_COMMUNITY)
Admission: RE | Disposition: A | Discharge status: Home / Self Care | Payer: Self-pay | Source: Home / Self Care | Attending: Orthopedic Surgery

## 2021-01-12 ENCOUNTER — Other Ambulatory Visit: Payer: Self-pay

## 2021-01-12 ENCOUNTER — Encounter (HOSPITAL_COMMUNITY): Payer: Self-pay | Admitting: Orthopedic Surgery

## 2021-01-12 DIAGNOSIS — Z96612 Presence of left artificial shoulder joint: Secondary | ICD-10-CM

## 2021-01-12 DIAGNOSIS — Z79899 Other long term (current) drug therapy: Secondary | ICD-10-CM | POA: Diagnosis not present

## 2021-01-12 DIAGNOSIS — M129 Arthropathy, unspecified: Secondary | ICD-10-CM | POA: Diagnosis present

## 2021-01-12 HISTORY — PX: REVERSE SHOULDER ARTHROPLASTY: SHX5054

## 2021-01-12 SURGERY — ARTHROPLASTY, SHOULDER, TOTAL, REVERSE
Anesthesia: General | Site: Shoulder | Laterality: Left

## 2021-01-12 MED ORDER — ONDANSETRON HCL 4 MG/2ML IJ SOLN
INTRAMUSCULAR | Status: AC
  Filled 2021-01-12: qty 2

## 2021-01-12 MED ORDER — TRANEXAMIC ACID-NACL 1000-0.7 MG/100ML-% IV SOLN
1000.0000 mg | INTRAVENOUS | Status: AC
  Administered 2021-01-12: 1000 mg via INTRAVENOUS

## 2021-01-12 MED ORDER — ROCURONIUM BROMIDE 10 MG/ML (PF) SYRINGE
PREFILLED_SYRINGE | INTRAVENOUS | Status: AC
  Filled 2021-01-12: qty 10

## 2021-01-12 MED ORDER — MIDAZOLAM HCL 2 MG/2ML IJ SOLN: 1.0000 mg | INTRAMUSCULAR | Status: DC

## 2021-01-12 MED ORDER — DEXAMETHASONE SODIUM PHOSPHATE 10 MG/ML IJ SOLN
INTRAMUSCULAR | Status: AC
  Filled 2021-01-12: qty 1

## 2021-01-12 MED ORDER — SUGAMMADEX SODIUM 200 MG/2ML IV SOLN
INTRAVENOUS | Status: DC | PRN
  Administered 2021-01-12: 200 mg via INTRAVENOUS

## 2021-01-12 MED ORDER — ROCURONIUM BROMIDE 10 MG/ML (PF) SYRINGE
PREFILLED_SYRINGE | INTRAVENOUS | Status: DC | PRN
  Administered 2021-01-12: 100 mg via INTRAVENOUS

## 2021-01-12 MED ORDER — VANCOMYCIN HCL 1000 MG IV SOLR
INTRAVENOUS | Status: AC
  Filled 2021-01-12: qty 1000

## 2021-01-12 MED ORDER — FENTANYL CITRATE (PF) 100 MCG/2ML IJ SOLN
INTRAMUSCULAR | Status: AC
  Administered 2021-01-12: 100 ug via INTRAVENOUS
  Filled 2021-01-12: qty 2

## 2021-01-12 MED ORDER — FENTANYL CITRATE (PF) 100 MCG/2ML IJ SOLN
INTRAMUSCULAR | Status: AC
  Filled 2021-01-12: qty 2

## 2021-01-12 MED ORDER — DEXAMETHASONE SODIUM PHOSPHATE 10 MG/ML IJ SOLN
INTRAMUSCULAR | Status: DC | PRN
  Administered 2021-01-12: 5 mg via INTRAVENOUS

## 2021-01-12 MED ORDER — PROPOFOL 10 MG/ML IV BOLUS
INTRAVENOUS | Status: DC | PRN
  Administered 2021-01-12: 200 mg via INTRAVENOUS

## 2021-01-12 MED ORDER — FENTANYL CITRATE (PF) 250 MCG/5ML IJ SOLN
INTRAMUSCULAR | Status: DC | PRN
  Administered 2021-01-12 (×2): 50 ug via INTRAVENOUS

## 2021-01-12 MED ORDER — FENTANYL CITRATE (PF) 100 MCG/2ML IJ SOLN: 50.0000 ug | INTRAMUSCULAR | Status: DC

## 2021-01-12 MED ORDER — FENTANYL CITRATE (PF) 100 MCG/2ML IJ SOLN: 25.0000 ug | INTRAMUSCULAR | Status: DC | PRN

## 2021-01-12 MED ORDER — MIDAZOLAM HCL 2 MG/2ML IJ SOLN: INTRAMUSCULAR | Status: DC | PRN

## 2021-01-12 MED ORDER — ORAL CARE MOUTH RINSE: 15.0000 mL | Freq: Once | OROMUCOSAL | Status: AC

## 2021-01-12 MED ORDER — PHENYLEPHRINE HCL-NACL 10-0.9 MG/250ML-% IV SOLN
INTRAVENOUS | Status: DC | PRN
  Administered 2021-01-12: 25 ug/min via INTRAVENOUS

## 2021-01-12 MED ORDER — OXYCODONE HCL 5 MG PO TABS: 5.0000 mg | ORAL_TABLET | ORAL | 0 refills | Status: DC | PRN

## 2021-01-12 MED ORDER — CHLORHEXIDINE GLUCONATE 0.12 % MT SOLN
15.0000 mL | Freq: Once | OROMUCOSAL | Status: AC
  Administered 2021-01-12: 15 mL via OROMUCOSAL

## 2021-01-12 MED ORDER — CEFAZOLIN SODIUM-DEXTROSE 2-4 GM/100ML-% IV SOLN
INTRAVENOUS | Status: AC
  Filled 2021-01-12: qty 100

## 2021-01-12 MED ORDER — TRANEXAMIC ACID-NACL 1000-0.7 MG/100ML-% IV SOLN
INTRAVENOUS | Status: AC
  Filled 2021-01-12: qty 100

## 2021-01-12 MED ORDER — BUPIVACAINE HCL (PF) 0.5 % IJ SOLN
INTRAMUSCULAR | Status: DC | PRN
  Administered 2021-01-12: 15 mL via PERINEURAL

## 2021-01-12 MED ORDER — AMISULPRIDE (ANTIEMETIC) 5 MG/2ML IV SOLN: 10.0000 mg | Freq: Once | INTRAVENOUS | Status: DC | PRN

## 2021-01-12 MED ORDER — METHOCARBAMOL 500 MG PO TABS: 500.0000 mg | ORAL_TABLET | Freq: Four times a day (QID) | ORAL | 1 refills | Status: DC | PRN

## 2021-01-12 MED ORDER — MIDAZOLAM HCL 2 MG/2ML IJ SOLN
INTRAMUSCULAR | Status: AC
  Filled 2021-01-12: qty 2

## 2021-01-12 MED ORDER — 0.9 % SODIUM CHLORIDE (POUR BTL) OPTIME
TOPICAL | Status: DC | PRN
  Administered 2021-01-12: 1000 mL

## 2021-01-12 MED ORDER — ONDANSETRON 4 MG PO TBDP: 4.0000 mg | ORAL_TABLET | Freq: Three times a day (TID) | ORAL | 0 refills | Status: DC | PRN

## 2021-01-12 MED ORDER — VANCOMYCIN HCL 1000 MG IV SOLR
INTRAVENOUS | Status: DC | PRN
  Administered 2021-01-12: 1000 mg via TOPICAL

## 2021-01-12 MED ORDER — LIDOCAINE 2% (20 MG/ML) 5 ML SYRINGE
INTRAMUSCULAR | Status: DC | PRN
  Administered 2021-01-12: 80 mg via INTRAVENOUS

## 2021-01-12 MED ORDER — CEFAZOLIN SODIUM-DEXTROSE 2-4 GM/100ML-% IV SOLN
2.0000 g | INTRAVENOUS | Status: AC
  Administered 2021-01-12: 2 g via INTRAVENOUS

## 2021-01-12 MED ORDER — MIDAZOLAM HCL 2 MG/2ML IJ SOLN
INTRAMUSCULAR | Status: AC
  Administered 2021-01-12: 2 mg via INTRAVENOUS
  Filled 2021-01-12: qty 2

## 2021-01-12 MED ORDER — LIDOCAINE 2% (20 MG/ML) 5 ML SYRINGE
INTRAMUSCULAR | Status: AC
  Filled 2021-01-12: qty 5

## 2021-01-12 MED ORDER — LACTATED RINGERS IV SOLN: INTRAVENOUS | Status: DC

## 2021-01-12 MED ORDER — ONDANSETRON HCL 4 MG/2ML IJ SOLN
INTRAMUSCULAR | Status: DC | PRN
  Administered 2021-01-12: 4 mg via INTRAVENOUS

## 2021-01-12 MED ORDER — STERILE WATER FOR IRRIGATION IR SOLN
Status: DC | PRN
  Administered 2021-01-12: 2000 mL

## 2021-01-12 MED ORDER — BUPIVACAINE LIPOSOME 1.3 % IJ SUSP
INTRAMUSCULAR | Status: DC | PRN
  Administered 2021-01-12: 10 mL via PERINEURAL

## 2021-01-12 SURGICAL SUPPLY — 69 items
AID PSTN UNV HD RSTRNT DISP (MISCELLANEOUS)
APL PRP STRL LF DISP 70% ISPRP (MISCELLANEOUS)
BAG SPEC THK2 15X12 ZIP CLS (MISCELLANEOUS) ×1
BAG ZIPLOCK 12X15 (MISCELLANEOUS) ×2 IMPLANT
BASEPLATE AUG FULL 24X10X2 LAT (Plate) ×2 IMPLANT
BIT DRILL FLUTED 3.0 STRL (BIT) ×2 IMPLANT
BLADE SAG 18X100X1.27 (BLADE) ×2 IMPLANT
CALIBRATOR GLENOID VIP 5-D (SYSTAGENIX WOUND MANAGEMENT) ×2 IMPLANT
CHLORAPREP W/TINT 26 (MISCELLANEOUS) IMPLANT
COVER BACK TABLE 60X90IN (DRAPES) ×2 IMPLANT
COVER SURGICAL LIGHT HANDLE (MISCELLANEOUS) ×2 IMPLANT
COVER WAND RF STERILE (DRAPES) ×2 IMPLANT
CUP SUT UNIV REVERS 39 NEU (Shoulder) ×2 IMPLANT
DRAPE ORTHO SPLIT 77X108 STRL (DRAPES) ×4
DRAPE SHEET LG 3/4 BI-LAMINATE (DRAPES) ×2 IMPLANT
DRAPE SURG 17X11 SM STRL (DRAPES) ×2 IMPLANT
DRAPE SURG ORHT 6 SPLT 77X108 (DRAPES) ×2 IMPLANT
DRAPE TOP 10253 STERILE (DRAPES) ×2 IMPLANT
DRAPE U-SHAPE 47X51 STRL (DRAPES) ×2 IMPLANT
DRESSING AQUACEL AG SP 3.5X10 (GAUZE/BANDAGES/DRESSINGS) ×1 IMPLANT
DRSG AQUACEL AG ADV 3.5X 6 (GAUZE/BANDAGES/DRESSINGS) IMPLANT
DRSG AQUACEL AG ADV 3.5X10 (GAUZE/BANDAGES/DRESSINGS) ×2 IMPLANT
DRSG AQUACEL AG SP 3.5X10 (GAUZE/BANDAGES/DRESSINGS) ×2
ELECT REM PT RETURN 15FT ADLT (MISCELLANEOUS) ×2 IMPLANT
FACESHIELD WRAPAROUND (MASK) ×2 IMPLANT
GLENOSPHERE 39+4 LAT/24 UNI RV (Joint) ×2 IMPLANT
GLOVE SRG 8 PF TXTR STRL LF DI (GLOVE) ×2 IMPLANT
GLOVE SURG ENC MOIS LTX SZ7.5 (GLOVE) ×8 IMPLANT
GLOVE SURG UNDER POLY LF SZ8 (GLOVE) ×4
GOWN STRL REUS W/ TWL XL LVL3 (GOWN DISPOSABLE) ×2 IMPLANT
GOWN STRL REUS W/TWL XL LVL3 (GOWN DISPOSABLE) ×4
INSERT HUMERAL MED 39/ +3 (Shoulder) ×1 IMPLANT
INSERT MEDIUM HUMERAL 39/ +3 (Shoulder) ×2 IMPLANT
KIT BASIN OR (CUSTOM PROCEDURE TRAY) ×2 IMPLANT
KIT TURNOVER KIT A (KITS) ×2 IMPLANT
MANIFOLD NEPTUNE II (INSTRUMENTS) ×2 IMPLANT
NEEDLE TAPERED W/ NITINOL LOOP (MISCELLANEOUS) IMPLANT
NS IRRIG 1000ML POUR BTL (IV SOLUTION) ×2 IMPLANT
PACK SHOULDER (CUSTOM PROCEDURE TRAY) ×2 IMPLANT
PENCIL SMOKE EVACUATOR (MISCELLANEOUS) ×2 IMPLANT
PIN NITINOL TARGETER 2.8 (PIN) ×2 IMPLANT
PIN SET MODULAR GLENOID SYSTEM (PIN) ×2 IMPLANT
POST MODULAR 25 (Post) ×2 IMPLANT
POST MODULAR MGS BASEPLATE 25 (Post) ×1 IMPLANT
PROTECTOR NERVE ULNAR (MISCELLANEOUS) IMPLANT
REAMER ANGLED HEAD SMALL (DRILL) ×2 IMPLANT
RESTRAINT HEAD UNIVERSAL NS (MISCELLANEOUS) IMPLANT
SCREW PERI LOCK 5.5X16 (Screw) ×4 IMPLANT
SCREW PERI LOCK 5.5X24 (Screw) ×2 IMPLANT
SCREW PERIPHERAL NL 4.5X36 (Screw) ×2 IMPLANT
SLING ARM FOAM STRAP MED (SOFTGOODS) ×2 IMPLANT
SMARTMIX MINI TOWER (MISCELLANEOUS)
SPONGE LAP 4X18 RFD (DISPOSABLE) ×2 IMPLANT
STEM HUM UNIV REV APEX SZ 6 (Stem) ×2 IMPLANT
STRIP CLOSURE SKIN 1/2X4 (GAUZE/BANDAGES/DRESSINGS) ×2 IMPLANT
SUCTION FRAZIER HANDLE 10FR (MISCELLANEOUS) ×2
SUCTION TUBE FRAZIER 10FR DISP (MISCELLANEOUS) ×1 IMPLANT
SUT FIBERWIRE #2 38 T-5 BLUE (SUTURE)
SUT MON AB 3-0 SH 27 (SUTURE) ×2
SUT MON AB 3-0 SH27 (SUTURE) ×1 IMPLANT
SUT VIC AB 0 CT1 36 (SUTURE) ×2 IMPLANT
SUT VIC AB 1 CT1 36 (SUTURE) ×2 IMPLANT
SUT VIC AB 2-0 CT1 27 (SUTURE) ×4
SUT VIC AB 2-0 CT1 TAPERPNT 27 (SUTURE) ×2 IMPLANT
SUTURE FIBERWR #2 38 T-5 BLUE (SUTURE) IMPLANT
SUTURE TAPE 1.3 40 TPR END (SUTURE) ×2 IMPLANT
SUTURETAPE 1.3 40 TPR END (SUTURE) ×4
TOWEL OR 17X26 10 PK STRL BLUE (TOWEL DISPOSABLE) ×2 IMPLANT
TOWER SMARTMIX MINI (MISCELLANEOUS) IMPLANT

## 2021-01-12 NOTE — Anesthesia Preprocedure Evaluation (Addendum)
Anesthesia Evaluation  Patient identified by MRN, date of birth, ID band Patient awake    Reviewed: Allergy & Precautions, H&P , NPO status , Patient's Chart, lab work & pertinent test results  History of Anesthesia Complications Negative for: history of anesthetic complications  Airway Mallampati: II  TM Distance: >3 FB Neck ROM: Full    Dental no notable dental hx. (+) Dental Advisory Given, Missing   Pulmonary neg pulmonary ROS,    Pulmonary exam normal        Cardiovascular Exercise Tolerance: Good negative cardio ROS Normal cardiovascular exam  IMPRESSIONS   1. Left ventricular ejection fraction, by estimation, is 50 to 55%. The left ventricle has low normal function. The left ventricle has no regional wall motion abnormalities. There is mild asymmetric left ventricular hypertrophy. Left ventricular  diastolic parameters were normal. 2. Right ventricular systolic function is normal. The right ventricular size is normal. There is normal pulmonary artery systolic pressure. 3. The mitral valve is grossly normal. Mild mitral valve regurgitation. No evidence of mitral stenosis. 4. The aortic valve is not seen well in the short axis view. Cannot rule out a bicuspid AV. Suggest TEE if clinically indicated. . The aortic valve has an indeterminant number of cusps. Aortic valve regurgitation is mild. 5. Aortic dilatation noted. 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    Neuro/Psych negative neurological ROS  negative psych ROS   GI/Hepatic Neg liver ROS, GERD  Medicated,  Endo/Other  negative endocrine ROS  Renal/GU negative Renal ROS  negative genitourinary   Musculoskeletal   Abdominal   Peds  Hematology  (+) Blood dyscrasia, anemia ,   Anesthesia Other Findings   Reproductive/Obstetrics negative OB ROS                            Anesthesia  Physical  Anesthesia Plan  ASA: III  Anesthesia Plan: General   Post-op Pain Management:  Regional for Post-op pain   Induction: Intravenous  PONV Risk Score and Plan: 2 and Ondansetron, Dexamethasone and Midazolam  Airway Management Planned: Oral ETT  Additional Equipment:   Intra-op Plan:   Post-operative Plan: Extubation in OR  Informed Consent: I have reviewed the patients History and Physical, chart, labs and discussed the procedure including the risks, benefits and alternatives for the proposed anesthesia with the patient or authorized representative who has indicated his/her understanding and acceptance.     Dental advisory given  Plan Discussed with: Anesthesiologist and CRNA  Anesthesia Plan Comments:        Anesthesia Quick Evaluation

## 2021-01-12 NOTE — Transfer of Care (Signed)
Immediate Anesthesia Transfer of Care Note  Patient: TOMOYA RINGWALD  Procedure(s) Performed: REVERSE SHOULDER ARTHROPLASTY (Left Shoulder)  Patient Location: PACU  Anesthesia Type:GA combined with regional for post-op pain  Level of Consciousness: awake, alert , oriented and patient cooperative  Airway & Oxygen Therapy: Patient Spontanous Breathing and Patient connected to face mask oxygen  Post-op Assessment: Report given to RN and Post -op Vital signs reviewed and stable  Post vital signs: Reviewed and stable  Last Vitals:  Vitals Value Taken Time  BP    Temp    Pulse 79 01/12/21 1509  Resp 11 01/12/21 1509  SpO2 100 % 01/12/21 1509  Vitals shown include unvalidated device data.  Last Pain:  Vitals:   01/12/21 1232  TempSrc:   PainSc: 0-No pain         Complications: No complications documented.

## 2021-01-12 NOTE — Progress Notes (Signed)
Assisted Dr. Singer with left, ultrasound guided, interscalene  block. Side rails up, monitors on throughout procedure. See vital signs in flow sheet. Tolerated Procedure well.  

## 2021-01-12 NOTE — Anesthesia Postprocedure Evaluation (Signed)
Anesthesia Post Note  Patient: Alex Wilson  Procedure(s) Performed: REVERSE SHOULDER ARTHROPLASTY (Left Shoulder)     Patient location during evaluation: PACU Anesthesia Type: General Level of consciousness: sedated Pain management: pain level controlled Vital Signs Assessment: post-procedure vital signs reviewed and stable Respiratory status: spontaneous breathing and respiratory function stable Cardiovascular status: stable Postop Assessment: no apparent nausea or vomiting Anesthetic complications: no   No complications documented.  Last Vitals:  Vitals:   01/12/21 1515 01/12/21 1530  BP: 119/76 102/67  Pulse: 78 75  Resp: 13 12  Temp:    SpO2: 99% 97%    Last Pain:  Vitals:   01/12/21 1530  TempSrc:   PainSc: 0-No pain                 Francisca Harbuck DANIEL

## 2021-01-12 NOTE — Brief Op Note (Signed)
01/12/2021  2:33 PM  PATIENT:  Alex Wilson  55 y.o. male  PRE-OPERATIVE DIAGNOSIS:  Left shoulder rotator cuff arthropathy  POST-OPERATIVE DIAGNOSIS:  Left shoulder rotator cuff arthropathy  PROCEDURE:  Procedure(s) with comments: REVERSE SHOULDER ARTHROPLASTY (Left) - 2.5 hrs  SURGEON:  Surgeon(s) and Role:    * Aundria Rud, Noah Delaine, MD - Primary  PHYSICIAN ASSISTANT: Dion Saucier, PA-C  ANESTHESIA:   regional and general  EBL:  75 mL   BLOOD ADMINISTERED:none  DRAINS: none   LOCAL MEDICATIONS USED:  NONE  SPECIMEN:  No Specimen  DISPOSITION OF SPECIMEN:  N/A  COUNTS:  YES  TOURNIQUET:  * No tourniquets in log *  DICTATION: .Note written in EPIC  PLAN OF CARE: Discharge to home after PACU  PATIENT DISPOSITION:  PACU - hemodynamically stable.   Delay start of Pharmacological VTE agent (>24hrs) due to surgical blood loss or risk of bleeding: not applicable

## 2021-01-12 NOTE — Anesthesia Procedure Notes (Signed)
Procedure Name: Intubation Date/Time: 01/12/2021 1:16 PM Performed by: Elyn Peers, CRNA Pre-anesthesia Checklist: Patient identified, Emergency Drugs available, Suction available, Patient being monitored and Timeout performed Patient Re-evaluated:Patient Re-evaluated prior to induction Oxygen Delivery Method: Circle system utilized Preoxygenation: Pre-oxygenation with 100% oxygen Induction Type: IV induction Ventilation: Mask ventilation without difficulty Laryngoscope Size: Miller and 3 Grade View: Grade I Tube type: Oral Tube size: 7.5 mm Number of attempts: 1 Airway Equipment and Method: Stylet Placement Confirmation: ETT inserted through vocal cords under direct vision,  positive ETCO2 and breath sounds checked- equal and bilateral Secured at: 22 cm Tube secured with: Tape Dental Injury: Teeth and Oropharynx as per pre-operative assessment

## 2021-01-12 NOTE — Op Note (Signed)
01/12/2021  2:34 PM  PATIENT:  Alex Wilson    PRE-OPERATIVE DIAGNOSIS:  Left shoulder rotator cuff arthropathy  POST-OPERATIVE DIAGNOSIS:  Same  PROCEDURE:  REVERSE SHOULDER ARTHROPLASTY  SURGEON:  Yolonda Kida, MD  ASSISTANT: Dion Saucier, PA-C  Assistant attestation:  PA Mcclung was utilized throughout the procedure for positioning patient, approach to the shoulder, maintaining deep retractor placement, and implantation of reverse arthroplasty with complex closure and application of splint of sling.  ANESTHESIA:   General  ESTIMATED BLOOD LOSS: 75 cc  PREOPERATIVE INDICATIONS:  Alex Wilson is a  55 y.o. male with a diagnosis of Left shoulder rotator cuff arthropathy who failed conservative measures and elected for surgical management.    The risks benefits and alternatives were discussed with the patient preoperatively including but not limited to the risks of infection, bleeding, nerve injury, cardiopulmonary complications, the need for revision surgery, dislocation, brachial plexus palsy, incomplete relief of pain, among others, and the patient was willing to proceed.  OPERATIVE IMPLANTS:  Arthrex reverse system Size 6 mini stem 10 degree full wedge glenoid baseplate with +2 lateralization with 25 mm central post and inferior non locking screw and 3 otherwise peripheral locking screws 39 mm +4 glenosphere Standard polyethylene with +3 buildup.  OPERATIVE FINDINGS:  Significant degenerative changes of the humeral head and glenoid with large anterior and posterior osteophytes of the glenoid.  Significant inferior osteophytes of the humeral head.  Full-thickness cartilage loss.  Long head of the biceps tendon was absent.  Complete tears of the supraspinatus and infraspinatus.  Teres minor intact.  Subscapularis was quite diminutive and paperthin.  Upper border tearing was complete to the level of the mid portion of the lesser tuberosity.  OPERATIVE PROCEDURE: The  patient was brought to the operating room and placed in the supine position. General anesthesia was administered. IV antibiotics were given. A Foley was not placed. Time out was performed. The upper extremity was prepped and draped in usual sterile fashion. The patient was in a beachchair position. Deltopectoral approach was carried out.  Cephalic vein was mobilized and transported and retracted laterally throughout the procedure.  . The subscapularis was released off of the bone, and was noted to be very thin and atrophic with full-thickness tearing of the superior half.  I then performed circumferential releases of the humerus, and then dislocated the head, and then reamed with the reamer to the above named size.  I then applied the jig, and cut the humeral head in 30 of retroversion, and then turned my attention to the glenoid.  Deep retractors were placed, and I resected the labrum, and then placed a guidepin into the center position on the glenoid, with slight inferior inclination.  This was done with the patient specific navigation utilized.  I then reamed over the guidepin, and this created a small metaphyseal cancellus blush inferiorly, removing just the cartilage to the subchondral bone superiorly.  We reamed with the 10 degree full augment oriented in about the 1:30 position on the left shoulder.  The base plate was selected and impacted place.  We then placed a nonlocking screw inferiorly that had good compression.  We then placed 3 peripheral locking screws in the anterior, posterior, and superior orientation.. I placed a short locking screws on anterior and posterior aspects.  I then turned my attention to the glenosphere, and impacted this into place.  This was a 39+4 mm offset glenosphere.  The glenoid sphere was completely seated, and had engagement  of the [facility] taper. I then turned my attention back to the humerus.  I sequentially broached, and then trialed, and was found to restore  soft tissue tension, and it had 2 finger tightness. Therefore the above named components were selected. The shoulder felt stable throughout functional motion.  I then impacted the real prosthesis into place, as well as the real humeral tray, and reduced the shoulder. The shoulder had excellent motion, and was stable, and I irrigated the wounds copiously.   The subscapularis was not suitable for repair.  I then irrigated the shoulder copiously once more, repaired the deltopectoral interval with # 1 Vicryl followed by subcutaneous Vicryl, then monocryl for the skin,  with Steri-Strips and sterile gauze for the skin. The patient was awakened and returned back in stable and satisfactory condition. There no complications and they tolerated the procedure well.  All counts were correct x2.  Disposition:  Alex Wilson will be in a sling and nonweightbearing to the left upper extremity.  He will discharge home from PACU.  He will be able to remove the sling and perform activities of daily living as tolerated.  Otherwise I will see him back in the office in 2 weeks.

## 2021-01-12 NOTE — Discharge Instructions (Signed)
-  Maintain postoperative bandage until your follow-up appointment.  You may shower with this bandage in place.  However, do not submerge underwater.  -You should use your sling for comfort only.  You may remove your sling as needed for activities of daily living.  No lifting over 2 pounds.  -Apply ice to the left shoulder for 20 to 30 minutes out of each hour that you are awake.  She do this around-the-clock and as often as possible.  -For mild to moderate pain use Tylenol and or alternating with Advil around-the-clock.  For breakthrough pain use oxycodone as necessary.  -Return to see Dr. Aundria Rud in 2 weeks postoperatively for routine wound check.

## 2021-01-12 NOTE — Anesthesia Procedure Notes (Signed)
Anesthesia Regional Block: Interscalene brachial plexus block   Pre-Anesthetic Checklist: ,, timeout performed, Correct Patient, Correct Site, Correct Laterality, Correct Procedure, Correct Position, site marked, Risks and benefits discussed,  Surgical consent,  Pre-op evaluation,  At surgeon's request and post-op pain management  Laterality: Left  Prep: chloraprep       Needles:  Injection technique: Single-shot  Needle Type: Echogenic Stimulator Needle     Needle Length: 5cm  Needle Gauge: 22     Additional Needles:   Narrative:  Start time: 01/12/2021 12:01 PM End time: 01/12/2021 12:09 PM Injection made incrementally with aspirations every 5 mL.  Performed by: Personally  Anesthesiologist: Heather Roberts, MD  Additional Notes: Functioning IV was confirmed and monitors applied.  A 40mm 22ga echogenic arrow stimulator was used. Sterile prep and drape,hand hygiene and sterile gloves were used.Ultrasound guidance: relevant anatomy identified, needle position confirmed, local anesthetic spread visualized around nerve(s)., vascular puncture avoided.  Image printed for medical record.  Negative aspiration and negative test dose prior to incremental administration of local anesthetic. The patient tolerated the procedure well.
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
## 2021-01-15 ENCOUNTER — Encounter (HOSPITAL_COMMUNITY): Payer: Self-pay | Admitting: Orthopedic Surgery

## 2021-01-23 ENCOUNTER — Encounter: Payer: Self-pay | Admitting: Family Medicine

## 2021-01-23 ENCOUNTER — Other Ambulatory Visit: Payer: Self-pay

## 2021-01-23 ENCOUNTER — Ambulatory Visit (INDEPENDENT_AMBULATORY_CARE_PROVIDER_SITE_OTHER): Payer: 59 | Admitting: Family Medicine

## 2021-01-23 VITALS — BP 113/81 | HR 72 | Temp 98.0°F | Ht 66.0 in | Wt 192.8 lb

## 2021-01-23 DIAGNOSIS — M79605 Pain in left leg: Secondary | ICD-10-CM

## 2021-01-23 DIAGNOSIS — D500 Iron deficiency anemia secondary to blood loss (chronic): Secondary | ICD-10-CM

## 2021-01-23 DIAGNOSIS — R209 Unspecified disturbances of skin sensation: Secondary | ICD-10-CM

## 2021-01-23 DIAGNOSIS — D62 Acute posthemorrhagic anemia: Secondary | ICD-10-CM
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
## 2021-01-24 ENCOUNTER — Encounter: Payer: Self-pay | Admitting: Family Medicine

## 2021-01-24 DIAGNOSIS — M79605 Pain in left leg: Secondary | ICD-10-CM | POA: Insufficient documentation

## 2021-01-24 DIAGNOSIS — R209 Unspecified disturbances of skin sensation: Secondary | ICD-10-CM | POA: Insufficient documentation

## 2021-01-24 LAB — CBC WITH DIFFERENTIAL/PLATELET
Basophils Absolute: 0.1 10*3/uL (ref 0.0–0.2)
Basos: 1 %
EOS (ABSOLUTE): 0.1 10*3/uL (ref 0.0–0.4)
Eos: 2 %
Hematocrit: 41.5 % (ref 37.5–51.0)
Hemoglobin: 14.2 g/dL (ref 13.0–17.7)
Immature Grans (Abs): 0 10*3/uL (ref 0.0–0.1)
Immature Granulocytes: 1 %
Lymphocytes Absolute: 1.5 10*3/uL (ref 0.7–3.1)
Lymphs: 25 %
MCH: 30.3 pg (ref 26.6–33.0)
MCHC: 34.2 g/dL (ref 31.5–35.7)
MCV: 89 fL (ref 79–97)
Monocytes Absolute: 0.5 10*3/uL (ref 0.1–0.9)
Monocytes: 8 %
Neutrophils Absolute: 3.8 10*3/uL (ref 1.4–7.0)
Neutrophils: 63 %
Platelets: 311 10*3/uL (ref 150–450)
RBC: 4.68 x10E6/uL (ref 4.14–5.80)
RDW: 12.5 % (ref 11.6–15.4)
WBC: 6 10*3/uL (ref 3.4–10.8)

## 2021-01-24 LAB — CMP14+EGFR
ALT: 21 IU/L (ref 0–44)
AST: 20 IU/L (ref 0–40)
Albumin/Globulin Ratio: 1.9 (ref 1.2–2.2)
Albumin: 4.5 g/dL (ref 3.8–4.9)
Alkaline Phosphatase: 72 IU/L (ref 44–121)
BUN/Creatinine Ratio: 17 (ref 9–20)
BUN: 14 mg/dL (ref 6–24)
Bilirubin Total: 0.3 mg/dL (ref 0.0–1.2)
CO2: 21 mmol/L (ref 20–29)
Calcium: 9.7 mg/dL (ref 8.7–10.2)
Chloride: 104 mmol/L (ref 96–106)
Creatinine, Ser: 0.84 mg/dL (ref 0.76–1.27)
Globulin, Total: 2.4 g/dL (ref 1.5–4.5)
Glucose: 87 mg/dL (ref 65–99)
Potassium: 4.9 mmol/L (ref 3.5–5.2)
Sodium: 140 mmol/L (ref 134–144)
Total Protein: 6.9 g/dL (ref 6.0–8.5)
eGFR: 104 mL/min/{1.73_m2} (ref >59)

## 2021-01-24 LAB — TSH+FREE T4
Free T4: 1.6 ng/dL (ref 0.82–1.77)
TSH: 3.06 u[IU]/mL (ref 0.450–4.500)

## 2021-01-25 NOTE — Progress Notes (Signed)
Hello Alex Wilson,  Your lab result is normal and/or stable.Some minor variations that are not significant are commonly marked abnormal, but do not represent any medical problem for you.  Best regards, Macarthur Lorusso, M.D.

## 2021-03-10 ENCOUNTER — Other Ambulatory Visit: Payer: Self-pay | Admitting: Family Medicine

## 2021-03-22 ENCOUNTER — Other Ambulatory Visit: Payer: Self-pay | Admitting: Family Medicine

## 2021-04-22 ENCOUNTER — Other Ambulatory Visit: Payer: Self-pay | Admitting: Family Medicine

## 2021-04-23 NOTE — Telephone Encounter (Signed)
Gottschalk. NTBS 30 days given 03/22/21 

## 2021-04-30 ENCOUNTER — Other Ambulatory Visit: Payer: Self-pay | Admitting: Family Medicine

## 2021-05-01 ENCOUNTER — Telehealth: Payer: Self-pay | Admitting: Family Medicine

## 2021-05-01 ENCOUNTER — Other Ambulatory Visit: Payer: Self-pay | Admitting: Family Medicine

## 2021-05-01 MED ORDER — DICLOFENAC SODIUM 75 MG PO TBEC: DELAYED_RELEASE_TABLET | ORAL | 2 refills | Status: DC

## 2021-05-01 NOTE — Telephone Encounter (Signed)
  Prescription Request  05/01/2021  What is the name of the medication or equipment? protonix  Have you contacted your pharmacy to request a refill? (if applicable) yes  Which pharmacy would you like this sent to? [facility] in [place]   Patient notified that their request is being sent to the clinical staff for review and that they should receive a response within 2 business days.    *Pharmacy did not receive

## 2021-05-01 NOTE — Telephone Encounter (Signed)
Gottschalk. NTBS 30 days given 03/22/21

## 2021-05-01 NOTE — Telephone Encounter (Signed)
  Prescription Request  05/01/2021  What is the name of the medication or equipment? - diclofenac (VOLTAREN) 75 MG EC tablet  - pantoprazole (PROTONIX) 40 MG tablet  Have you contacted your pharmacy to request a refill? (if applicable) YES  Which pharmacy would you like this sent to? [facility] [place] pt has appt with Dr. Reece Agar. On 06/28/21 needs enough until appt he is out of these medications    Patient notified that their request is being sent to the clinical staff for review and that they should receive a response within 2 business days.

## 2021-05-01 NOTE — Telephone Encounter (Signed)
Refills sent in

## 2021-05-02 ENCOUNTER — Other Ambulatory Visit: Payer: Self-pay | Admitting: *Deleted

## 2021-05-02 MED ORDER — PANTOPRAZOLE SODIUM 40 MG PO TBEC: DELAYED_RELEASE_TABLET | ORAL | 1 refills | Status: DC

## 2021-06-11 ENCOUNTER — Encounter: Payer: Self-pay | Admitting: Internal Medicine

## 2021-06-28 ENCOUNTER — Ambulatory Visit: Payer: 59 | Admitting: Family Medicine

## 2021-07-02 ENCOUNTER — Encounter: Payer: Self-pay | Admitting: Family Medicine

## 2021-07-26 ENCOUNTER — Encounter: Payer: Self-pay | Admitting: Family Medicine

## 2021-07-26 ENCOUNTER — Other Ambulatory Visit: Payer: Self-pay

## 2021-07-26 ENCOUNTER — Ambulatory Visit (INDEPENDENT_AMBULATORY_CARE_PROVIDER_SITE_OTHER): Payer: 59 | Admitting: Family Medicine

## 2021-07-26 VITALS — BP 115/72 | HR 73 | Temp 97.6°F | Ht 66.0 in | Wt 191.2 lb

## 2021-07-26 DIAGNOSIS — Z125 Encounter for screening for malignant neoplasm of prostate: Secondary | ICD-10-CM

## 2021-07-26 DIAGNOSIS — K219 Gastro-esophageal reflux disease without esophagitis: Secondary | ICD-10-CM

## 2021-07-26 DIAGNOSIS — G8929 Other chronic pain: Secondary | ICD-10-CM

## 2021-07-26 DIAGNOSIS — E7800 Pure hypercholesterolemia, unspecified: Secondary | ICD-10-CM | POA: Diagnosis not present

## 2021-07-26 DIAGNOSIS — M25512 Pain in left shoulder: Secondary | ICD-10-CM

## 2021-07-26 DIAGNOSIS — Z0000 Encounter for general adult medical examination without abnormal findings: Secondary | ICD-10-CM

## 2021-07-26 DIAGNOSIS — Z0001 Encounter for general adult medical examination with abnormal findings: Secondary | ICD-10-CM | POA: Diagnosis not present

## 2021-07-26 MED ORDER — DICLOFENAC SODIUM 75 MG PO TBEC
DELAYED_RELEASE_TABLET | ORAL | 3 refills | Status: DC
Start: 2021-07-26 — End: 2021-07-26

## 2021-07-26 MED ORDER — PANTOPRAZOLE SODIUM 40 MG PO TBEC: 40.0000 mg | DELAYED_RELEASE_TABLET | Freq: Two times a day (BID) | ORAL | 3 refills | Status: DC

## 2021-07-26 NOTE — Patient Instructions (Signed)
Discontinue Diclofenac for now Increase Protonix to 1 tablet twice daily. If no improvement in next month, call [facility] and get an appt. Come in for fasting labs  Preventive Care 33-54 Years Old, Male Preventive care refers to lifestyle choices and visits with your health care provider that can promote health and wellness. This includes: A yearly physical exam. This is also called an annual wellness visit. Regular dental and eye exams. Immunizations. Screening for certain conditions. Healthy lifestyle choices, such as: Eating a healthy diet. Getting regular exercise. Not using drugs or products that contain nicotine and tobacco. Limiting alcohol use. What can I expect for my preventive care visit? Physical exam Your health care provider will check your: Height and weight. These may be used to calculate your BMI (body mass index). BMI is a measurement that tells if you are at a healthy weight. Heart rate and blood pressure. Body temperature. Skin for abnormal spots. Counseling Your health care provider may ask you questions about your: Past medical problems. Family's medical history. Alcohol, tobacco, and drug use. Emotional well-being. Home life and relationship well-being. Sexual activity. Diet, exercise, and sleep habits. Work and work Astronomer. Access to firearms. What immunizations do I need? Vaccines are usually given at various ages, according to a schedule. Your health care provider will recommend vaccines for you based on your age, medical history, and lifestyle or other factors, such as travel or where you work. What tests do I need? Blood tests Lipid and cholesterol levels. These may be checked every 5 years, or more often if you are over 17 years old. Hepatitis C test. Hepatitis B test. Screening Lung cancer screening. You may have this screening every year starting at age 70 if you have a 30-pack-year history of smoking and currently smoke or have quit within  the past 15 years. Prostate cancer screening. Recommendations will vary depending on your family history and other risks. Genital exam to check for testicular cancer or hernias. Colorectal cancer screening. All adults should have this screening starting at age 30 and continuing until age 20. Your health care provider may recommend screening at age 7 if you are at increased risk. You will have tests every 1-10 years, depending on your results and the type of screening test. Diabetes screening. This is done by checking your blood sugar (glucose) after you have not eaten for a while (fasting). You may have this done every 1-3 years. STD (sexually transmitted disease) testing, if you are at risk. Follow these instructions at home: Eating and drinking  Eat a diet that includes fresh fruits and vegetables, whole grains, lean protein, and low-fat dairy products. Take vitamin and mineral supplements as recommended by your health care provider. Do not drink alcohol if your health care provider tells you not to drink. If you drink alcohol: Limit how much you have to 0-2 drinks a day. Be aware of how much alcohol is in your drink. In the [place] one drink equals one 12 oz bottle of beer (355 mL), one 5 oz glass of wine (148 mL), or one 1 oz glass of hard liquor (44 mL). Lifestyle Take daily care of your teeth and gums. Brush your teeth every morning and night with fluoride toothpaste. Floss one time each day. Stay active. Exercise for at least 30 minutes 5 or more days each week. Do not use any products that contain nicotine or tobacco, such as cigarettes, e-cigarettes, and chewing tobacco. If you need help quitting, ask your health care provider. Do  not use drugs. If you are sexually active, practice safe sex. Use a condom or other form of protection to prevent STIs (sexually transmitted infections). If told by your health care provider, take low-dose aspirin daily starting at age 51. Find healthy  ways to cope with stress, such as: Meditation, yoga, or listening to music. Journaling. Talking to a trusted person. Spending time with friends and family. Safety Always wear your seat belt while driving or riding in a vehicle. Do not drive: If you have been drinking alcohol. Do not ride with someone who has been drinking. When you are tired or distracted. While texting. Wear a helmet and other protective equipment during sports activities. If you have firearms in your house, make sure you follow all gun safety procedures. What's next? Go to your health care provider once a year for an annual wellness visit. Ask your health care provider how often you should have your eyes and teeth checked. Stay up to date on all vaccines. This information is not intended to replace advice given to you by your health care provider. Make sure you discuss any questions you have with your health care provider. Document Revised: 01/05/2021 Document Reviewed: 10/22/2018 Elsevier Patient Education  2022 [employer].
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
## 2021-07-27 ENCOUNTER — Other Ambulatory Visit: Payer: 59

## 2021-07-27 NOTE — Addendum Note (Signed)
Addended by: Cassell Clement on: 07/27/2021 08:23 AM   Modules accepted: Orders

## 2021-07-28 LAB — CMP14+EGFR
ALT: 25 IU/L (ref 0–44)
AST: 22 IU/L (ref 0–40)
Albumin/Globulin Ratio: 2 (ref 1.2–2.2)
Albumin: 5 g/dL — ABNORMAL HIGH (ref 3.8–4.9)
Alkaline Phosphatase: 85 IU/L (ref 44–121)
BUN/Creatinine Ratio: 16 (ref 9–20)
BUN: 17 mg/dL (ref 6–24)
Bilirubin Total: 0.2 mg/dL (ref 0.0–1.2)
CO2: 20 mmol/L (ref 20–29)
Calcium: 10.2 mg/dL (ref 8.7–10.2)
Chloride: 99 mmol/L (ref 96–106)
Creatinine, Ser: 1.05 mg/dL (ref 0.76–1.27)
Globulin, Total: 2.5 g/dL (ref 1.5–4.5)
Glucose: 93 mg/dL (ref 65–99)
Potassium: 4.9 mmol/L (ref 3.5–5.2)
Sodium: 138 mmol/L (ref 134–144)
Total Protein: 7.5 g/dL (ref 6.0–8.5)
eGFR: 85 mL/min/{1.73_m2} (ref >59)

## 2021-07-28 LAB — LIPID PANEL
Chol/HDL Ratio: 5.3 ratio — ABNORMAL HIGH (ref 0.0–5.0)
Cholesterol, Total: 229 mg/dL — ABNORMAL HIGH (ref 100–199)
HDL: 43 mg/dL (ref >39)
LDL Chol Calc (NIH): 170 mg/dL — ABNORMAL HIGH (ref 0–99)
Triglycerides: 90 mg/dL (ref 0–149)
VLDL Cholesterol Cal: 16 mg/dL (ref 5–40)

## 2021-07-28 LAB — PSA: Prostate Specific Ag, Serum: 0.5 ng/mL (ref 0.0–4.0)

## 2021-08-13 ENCOUNTER — Encounter: Payer: Self-pay | Admitting: Nurse Practitioner

## 2021-08-13 ENCOUNTER — Ambulatory Visit (INDEPENDENT_AMBULATORY_CARE_PROVIDER_SITE_OTHER): Payer: 59 | Admitting: Nurse Practitioner

## 2021-08-13 VITALS — BP 112/78 | HR 80 | Temp 97.8°F | Ht 66.0 in | Wt 185.0 lb

## 2021-08-13 DIAGNOSIS — Z20822 Contact with and (suspected) exposure to covid-19: Secondary | ICD-10-CM | POA: Diagnosis not present

## 2021-08-13 NOTE — Progress Notes (Signed)
   Subjective:    Patient ID: Alex Wilson, male    DOB: [birth year], 55 y.o.   MRN: [id number]  Chief Complaint: URI   HPI Patient said on Friday he developed cold symptoms. He took some OTC meds. As the weekend progressed he developed chest congestion and tight cough. He has sore throat as well. He has not done covid test or gotten vaccines.     Review of Systems  Constitutional:  Positive for fatigue. Negative for chills and fever.  HENT:  Positive for congestion and sneezing. Negative for ear pain and sinus pressure.   Respiratory:  Positive for cough. Negative for shortness of breath and wheezing.   Musculoskeletal:  Negative for myalgias.  Neurological:  Negative for dizziness and headaches.      Objective:   Physical Exam Vitals and nursing note reviewed.  Constitutional:      Appearance: Normal appearance.  HENT:     Right Ear: Tympanic membrane normal. There is no impacted cerumen.     Left Ear: Tympanic membrane normal. There is no impacted cerumen.     Nose: Congestion present.     Mouth/Throat:     Mouth: Mucous membranes are moist.     Pharynx: No oropharyngeal exudate or posterior oropharyngeal erythema.  Eyes:     Pupils: Pupils are equal, round, and reactive to light.  Cardiovascular:     Rate and Rhythm: Normal rate and regular rhythm.     Heart sounds: Normal heart sounds.  Pulmonary:     Breath sounds: Normal breath sounds.  Musculoskeletal:     Cervical back: Normal range of motion.  Skin:    General: Skin is warm.  Neurological:     General: No focal deficit present.     Mental Status: He is alert.    BP 112/78   Pulse 80   Temp 97.8 F (36.6 C) (Skin)   Ht 5\' 6"  (1.676 m)   Wt 185 lb (83.9 kg)   BMI 29.86 kg/m         Assessment & Plan:  in today with chief complaint of URI   1. Suspected COVID-19 virus infection 1. Take meds as prescribed 2. Use a cool mist humidifier especially during the winter months and when  heat has been humid. 3. Use saline nose sprays frequently 4. Saline irrigations of the nose can be very helpful if done frequently.  * 4X daily for 1 week*  * Use of a nettie pot can be helpful with this. Follow directions with this* 5. Drink plenty of fluids 6. Keep thermostat turn down low 7.For any cough or congestion  Use plain Mucinex- regular strength or max strength is fine   * Children- consult with Pharmacist for dosing 8. For fever or aces or pains- take tylenol or ibuprofen appropriate for age and weight.  * for fevers greater than 101 orally you may alternate ibuprofen and tylenol every  3 hours.    - Novel Coronavirus, [facility] ([facility]) Quarantine until get test results   The above assessment and management plan was discussed with the patient. The patient verbalized understanding of and has agreed to the management plan. Patient is aware to call the clinic if symptoms persist or worsen. Patient is aware when to return to the clinic for a follow-up visit. Patient educated on when it is appropriate to go to the emergency department.   Mary-Margaret 01-27-1985, FNP

## 2021-08-13 NOTE — Patient Instructions (Signed)
COVID-19: Quarantine and Isolation Quarantine If you were exposed Quarantine and stay away from others when you have been in close contact with someone who has COVID-19. Isolate If you are sick or test positive Isolate when you are sick or when you have COVID-19, even if you don't have symptoms. When to stay home Calculating quarantine The date of your exposure is considered day 0. Day 1 is the first full day after your last contact with a person who has had COVID-19. Stay home and away from other people for at least 5 days. Learn why CDC updated guidance for the general public. IF YOU were exposed to COVID-19 and are NOT  up to dateIF YOU were exposed to COVID-19 and are NOT on COVID-19 vaccinations Quarantine for at least 5 days Stay home Stay home and quarantine for at least 5 full days. Wear a well-fitting mask if you must be around others in your home. Do not travel. Get tested Even if you don't develop symptoms, get tested at least 5 days after you last had close contact with someone with COVID-19. After quarantine Watch for symptoms Watch for symptoms until 10 days after you last had close contact with someone with COVID-19. Avoid travel It is best to avoid travel until a full 10 days after you last had close contact with someone with COVID-19. If you develop symptoms Isolate immediately and get tested. Continue to stay home until you know the results. Wear a well-fitting mask around others. Take precautions until day 10 Wear a well-fitting mask Wear a well-fitting mask for 10 full days any time you are around others inside your home or in public. Do not go to places where you are unable to wear a well-fitting mask. If you must travel during days 6-10, take precautions. Avoid being around people who are more likely to get very sick from COVID-19. IF YOU were exposed to COVID-19 and are  up to dateIF YOU were exposed to COVID-19 and are on COVID-19 vaccinations No  quarantine You do not need to stay home unless you develop symptoms. Get tested Even if you don't develop symptoms, get tested at least 5 days after you last had close contact with someone with COVID-19. Watch for symptoms Watch for symptoms until 10 days after you last had close contact with someone with COVID-19. If you develop symptoms Isolate immediately and get tested. Continue to stay home until you know the results. Wear a well-fitting mask around others. Take precautions until day 10 Wear a well-fitting mask Wear a well-fitting mask for 10 full days any time you are around others inside your home or in public. Do not go to places where you are unable to wear a well-fitting mask. Take precautions if traveling Avoid being around people who are more likely to get very sick from COVID-19. IF YOU were exposed to COVID-19 and had confirmed COVID-19 within the past 90 days (you tested positive using a viral test) No quarantine You do not need to stay home unless you develop symptoms. Watch for symptoms Watch for symptoms until 10 days after you last had close contact with someone with COVID-19. If you develop symptoms Isolate immediately and get tested. Continue to stay home until you know the results. Wear a well-fitting mask around others. Take precautions until day 10 Wear a well-fitting mask Wear a well-fitting mask for 10 full days any time you are around others inside your home or in public. Do not go to places where you are   unable to wear a well-fitting mask. Take precautions if traveling Avoid being around people who are more likely to get very sick from COVID-19. Calculating isolation Day 0 is your first day of symptoms or a positive viral test. Day 1 is the first full day after your symptoms developed or your test specimen was collected. If you have COVID-19 or have symptoms, isolate for at least 5 days. IF YOU tested positive for COVID-19 or have symptoms, regardless of  vaccination status Stay home for at least 5 days Stay home for 5 days and isolate from others in your home. Wear a well-fitting mask if you must be around others in your home. Do not travel. Ending isolation if you had symptoms End isolation after 5 full days if you are fever-free for 24 hours (without the use of fever-reducing medication) and your symptoms are improving. Ending isolation if you did NOT have symptoms End isolation after at least 5 full days after your positive test. If you got very sick from COVID-19 or have a weakened immune system You should isolate for at least 10 days. Consult your doctor before ending isolation. Take precautions until day 10 Wear a well-fitting mask Wear a well-fitting mask for 10 full days any time you are around others inside your home or in public. Do not go to places where you are unable to wear a well-fitting mask. Do not travel Do not travel until a full 10 days after your symptoms started or the date your positive test was taken if you had no symptoms. Avoid being around people who are more likely to get very sick from COVID-19. Definitions Exposure Contact with someone infected with SARS-CoV-2, the virus that causes COVID-19, in a way that increases the likelihood of getting infected with the virus. Close contact A close contact is someone who was less than 6 feet away from an infected person (laboratory-confirmed or a clinical diagnosis) for a cumulative total of 15 minutes or more over a 24-hour period. For example, three individual 5-minute exposures for a total of 15 minutes. People who are exposed to someone with COVID-19 after they completed at least 5 days of isolation are not considered close contacts. Quarantine Quarantine is a strategy used to prevent transmission of COVID-19 by keeping people who have been in close contact with someone with COVID-19 apart from others. Who does not need to quarantine? If you had close contact with  someone with COVID-19 and you are in one of the following groups, you do not need to quarantine. You are up to date with your COVID-19 vaccines. You had confirmed COVID-19 within the last 90 days (meaning you tested positive using a viral test). If you are up to date with COVID-19 vaccines, you should wear a well-fitting mask around others for 10 days from the date of your last close contact with someone with COVID-19 (the date of last close contact is considered day 0). Get tested at least 5 days after you last had close contact with someone with COVID-19. If you test positive or develop COVID-19 symptoms, isolate from other people and follow recommendations in the Isolation section below. If you tested positive for COVID-19 with a viral test within the previous 90 days and subsequently recovered and remain without COVID-19 symptoms, you do not need to quarantine or get tested after close contact. You should wear a well-fitting mask around others for 10 days from the date of your last close contact with someone with COVID-19 (the date of last   close contact is considered day 0). If you have COVID-19 symptoms, get tested and isolate from other people and follow recommendations in the Isolation section below. Who should quarantine? If you come into close contact with someone with COVID-19, you should quarantine if you are not up to date on COVID-19 vaccines. This includes people who are not vaccinated. What to do for quarantine Stay home and away from other people for at least 5 days (day 0 through day 5) after your last contact with a person who has COVID-19. The date of your exposure is considered day 0. Wear a well-fitting mask when around others at home, if possible. For 10 days after your last close contact with someone with COVID-19, watch for fever (100.4F or greater), cough, shortness of breath, or other COVID-19 symptoms. If you develop symptoms, get tested immediately and isolate until you receive  your test results. If you test positive, follow isolation recommendations. If you do not develop symptoms, get tested at least 5 days after you last had close contact with someone with COVID-19. If you test negative, you can leave your home, but continue to wear a well-fitting mask when around others at home and in public until 10 days after your last close contact with someone with COVID-19. If you test positive, you should isolate for at least 5 days from the date of your positive test (if you do not have symptoms). If you do develop COVID-19 symptoms, isolate for at least 5 days from the date your symptoms began (the date the symptoms started is day 0). Follow recommendations in the isolation section below. If you are unable to get a test 5 days after last close contact with someone with COVID-19, you can leave your home after day 5 if you have been without COVID-19 symptoms throughout the 5-day period. Wear a well-fitting mask for 10 days after your date of last close contact when around others at home and in public. Avoid people who are have weakened immune systems or are more likely to get very sick from COVID-19, and nursing homes and other high-risk settings, until after at least 10 days. If possible, stay away from people you live with, especially people who are at higher risk for getting very sick from COVID-19, as well as others outside your home throughout the full 10 days after your last close contact with someone with COVID-19. If you are unable to quarantine, you should wear a well-fitting mask for 10 days when around others at home and in public. If you are unable to wear a mask when around others, you should continue to quarantine for 10 days. Avoid people who have weakened immune systems or are more likely to get very sick from COVID-19, and nursing homes and other high-risk settings, until after at least 10 days. See additional information about travel. Do not go to places where you are  unable to wear a mask, such as restaurants and some gyms, and avoid eating around others at home and at work until after 10 days after your last close contact with someone with COVID-19. After quarantine Watch for symptoms until 10 days after your last close contact with someone with COVID-19. If you have symptoms, isolate immediately and get tested. Quarantine in high-risk congregate settings In certain congregate settings that have high risk of secondary transmission (such as correctional and detention facilities, homeless shelters, or cruise ships), CDC recommends a 10-day quarantine for residents, regardless of vaccination and booster status. During periods of critical staffing   shortages, facilities may consider shortening the quarantine period for staff to ensure continuity of operations. Decisions to shorten quarantine in these settings should be made in consultation with state, local, tribal, or territorial health departments and should take into consideration the context and characteristics of the facility. CDC's setting-specific guidance provides additional recommendations for these settings. Isolation Isolation is used to separate people with confirmed or suspected COVID-19 from those without COVID-19. People who are in isolation should stay home until it's safe for them to be around others. At home, anyone sick or infected should separate from others, or wear a well-fitting mask when they need to be around others. People in isolation should stay in a specific "sick room" or area and use a separate bathroom if available. Everyone who has presumed or confirmed COVID-19 should stay home and isolate from other people for at least 5 full days (day 0 is the first day of symptoms or the date of the day of the positive viral test for asymptomatic persons). They should wear a mask when around others at home and in public for an additional 5 days. People who are confirmed to have COVID-19 or are showing  symptoms of COVID-19 need to isolate regardless of their vaccination status. This includes: People who have a positive viral test for COVID-19, regardless of whether or not they have symptoms. People with symptoms of COVID-19, including people who are awaiting test results or have not been tested. People with symptoms should isolate even if they do not know if they have been in close contact with someone with COVID-19. What to do for isolation Monitor your symptoms. If you have an emergency warning sign (including trouble breathing), seek emergency medical care immediately. Stay in a separate room from other household members, if possible. Use a separate bathroom, if possible. Take steps to improve ventilation at home, if possible. Avoid contact with other members of the household and pets. Don't share personal household items, like cups, towels, and utensils. Wear a well-fitting mask when you need to be around other people. Learn more about what to do if you are sick and how to notify your contacts. Ending isolation for people who had COVID-19 and had symptoms If you had COVID-19 and had symptoms, isolate for at least 5 days. To calculate your 5-day isolation period, day 0 is your first day of symptoms. Day 1 is the first full day after your symptoms developed. You can leave isolation after 5 full days. You can end isolation after 5 full days if you are fever-free for 24 hours without the use of fever-reducing medication and your other symptoms have improved (Loss of taste and smell may persist for weeks or months after recovery and need not delay the end of isolation). You should continue to wear a well-fitting mask around others at home and in public for 5 additional days (day 6 through day 10) after the end of your 5-day isolation period. If you are unable to wear a mask when around others, you should continue to isolate for a full 10 days. Avoid people who have weakened immune systems or are more  likely to get very sick from COVID-19, and nursing homes and other high-risk settings, until after at least 10 days. If you continue to have fever or your other symptoms have not improved after 5 days of isolation, you should wait to end your isolation until you are fever-free for 24 hours without the use of fever-reducing medication and your other symptoms have improved.   Continue to wear a well-fitting mask through day 10. Contact your healthcare provider if you have questions. See additional information about travel. Do not go to places where you are unable to wear a mask, such as restaurants and some gyms, and avoid eating around others at home and at work until a full 10 days after your first day of symptoms. If an individual has access to a test and wants to test, the best approach is to use an antigen test1 towards the end of the 5-day isolation period. Collect the test sample only if you are fever-free for 24 hours without the use of fever-reducing medication and your other symptoms have improved (loss of taste and smell may persist for weeks or months after recovery and need not delay the end of isolation). If your test result is positive, you should continue to isolate until day 10. If your test result is negative, you can end isolation, but continue to wear a well-fitting mask around others at home and in public until day 10. Follow additional recommendations for masking and avoiding travel as described above. 1As noted in the labeling for authorized over-the counter antigen tests: Negative results should be treated as presumptive. Negative results do not rule out SARS-CoV-2 infection and should not be used as the sole basis for treatment or patient management decisions, including infection control decisions. To improve results, antigen tests should be used twice over a three-day period with at least 24 hours and no more than 48 hours between tests. Note that these recommendations on ending isolation  do not apply to people who are moderately ill or very sick from COVID-19 or have weakened immune systems. See section below for recommendations for when to end isolation for these groups. Ending isolation for people who tested positive for COVID-19 but had no symptoms If you test positive for COVID-19 and never develop symptoms, isolate for at least 5 days. Day 0 is the day of your positive viral test (based on the date you were tested) and day 1 is the first full day after the specimen was collected for your positive test. You can leave isolation after 5 full days. If you continue to have no symptoms, you can end isolation after at least 5 days. You should continue to wear a well-fitting mask around others at home and in public until day 10 (day 6 through day 10). If you are unable to wear a mask when around others, you should continue to isolate for 10 days. Avoid people who have weakened immune systems or are more likely to get very sick from COVID-19, and nursing homes and other high-risk settings, until after at least 10 days. If you develop symptoms after testing positive, your 5-day isolation period should start over. Day 0 is your first day of symptoms. Follow the recommendations above for ending isolation for people who had COVID-19 and had symptoms. See additional information about travel. Do not go to places where you are unable to wear a mask, such as restaurants and some gyms, and avoid eating around others at home and at work until 10 days after the day of your positive test. If an individual has access to a test and wants to test, the best approach is to use an antigen test1 towards the end of the 5-day isolation period. If your test result is positive, you should continue to isolate until day 10. If your test result is positive, you can also choose to test daily and if your test result   is negative, you can end isolation, but continue to wear a well-fitting mask around others at home and in  public until day 10. Follow additional recommendations for masking and avoiding travel as described above. 1As noted in the labeling for authorized over-the counter antigen tests: Negative results should be treated as presumptive. Negative results do not rule out SARS-CoV-2 infection and should not be used as the sole basis for treatment or patient management decisions, including infection control decisions. To improve results, antigen tests should be used twice over a three-day period with at least 24 hours and no more than 48 hours between tests. Ending isolation for people who were moderately or very sick from COVID-19 or have a weakened immune system People who are moderately ill from COVID-19 (experiencing symptoms that affect the lungs like shortness of breath or difficulty breathing) should isolate for 10 days and follow all other isolation precautions. To calculate your 10-day isolation period, day 0 is your first day of symptoms. Day 1 is the first full day after your symptoms developed. If you are unsure if your symptoms are moderate, talk to a healthcare provider for further guidance. People who are very sick from COVID-19 (this means people who were hospitalized or required intensive care or ventilation support) and people who have weakened immune systems might need to isolate at home longer. They may also require testing with a viral test to determine when they can be around others. CDC recommends an isolation period of at least 10 and up to 20 days for people who were very sick from COVID-19 and for people with weakened immune systems. Consult with your healthcare provider about when you can resume being around other people. If you are unsure if your symptoms are severe or if you have a weakened immune system, talk to a healthcare provider for further guidance. People who have a weakened immune system should talk to their healthcare provider about the potential for reduced immune responses to  COVID-19 vaccines and the need to continue to follow current prevention measures (including wearing a well-fitting mask and avoiding crowds and poorly ventilated indoor spaces) to protect themselves against COVID-19 until advised otherwise by their healthcare provider. Close contacts of immunocompromised people--including household members--should also be encouraged to receive all recommended COVID-19 vaccine doses to help protect these people. Isolation in high-risk congregate settings In certain high-risk congregate settings that have high risk of secondary transmission and where it is not feasible to cohort people (such as correctional and detention facilities, homeless shelters, and cruise ships), CDC recommends a 10-day isolation period for residents. During periods of critical staffing shortages, facilities may consider shortening the isolation period for staff to ensure continuity of operations. Decisions to shorten isolation in these settings should be made in consultation with state, local, tribal, or territorial health departments and should take into consideration the context and characteristics of the facility. CDC's setting-specific guidance provides additional recommendations for these settings. This CDC guidance is meant to supplement--not replace--any federal, state, local, territorial, or tribal health and safety laws, rules, and regulations. Recommendations for specific settings These recommendations do not apply to healthcare professionals. For guidance specific to these settings, see Healthcare professionals: Interim Guidance for Managing Healthcare Personnel with SARS-CoV-2 Infection or Exposure to SARS-CoV-2 Patients, residents, and visitors to healthcare settings: Interim Infection Prevention and Control Recommendations for Healthcare Personnel During the Coronavirus Disease 2019 (COVID-19) Pandemic Additional setting-specific guidance and recommendations are available. These  recommendations on quarantine and isolation do apply to K[employer]12 School   settings. Additional guidance is available here: Overview of COVID-19 Quarantine for K-12 Schools Travelers: Travel information and recommendations Congregate facilities and other settings: guidance pages for community, work, and school settings Ongoing COVID-19 exposure FAQs I live with someone with COVID-19, but I cannot be separated from them. How do we manage quarantine in this situation? It is very important for people with COVID-19 to remain apart from other people, if possible, even if they are living together. If separation of the person with COVID-19 from others that they live with is not possible, the other people that they live with will have ongoing exposure, meaning they will be repeatedly exposed until that person is no longer able to spread the virus to other people. In this situation, there are precautions you can take to limit the spread of COVID-19: The person with COVID-19 and everyone they live with should wear a well-fitting mask inside the home. If possible, one person should care for the person with COVID-19 to limit the number of people who are in close contact with the infected person. Take steps to protect yourself and others to reduce transmission in the home: Quarantine if you are not up to date with your COVID-19 vaccines. Isolate if you are sick or tested positive for COVID-19, even if you don't have symptoms. Learn more about the public health recommendations for testing, mask use and quarantine of close contacts, like yourself, who have ongoing exposure. These recommendations differ depending on your vaccination status. What should I do if I have ongoing exposure to COVID-19 from someone I live with? Recommendations for this situation depend on your vaccination status: If you are not up to date on COVID-19 vaccines and have ongoing exposure to COVID-19, you should: Begin quarantine immediately and  continue to quarantine throughout the isolation period of the person with COVID-19. Continue to quarantine for an additional 5 days starting the day after the end of isolation for the person with COVID-19. Get tested at least 5 days after the end of isolation of the infected person that lives with them. If you test negative, you can leave the home but should continue to wear a well-fitting mask when around others at home and in public until 10 days after the end of isolation for the person with COVID-19. Isolate immediately if you develop symptoms of COVID-19 or test positive. If you are up to date with COVID-19 vaccines and have ongoing exposure to COVID-19, you should: Get tested at least 5 days after your first exposure. A person with COVID-19 is considered infectious starting 2 days before they develop symptoms, or 2 days before the date of their positive test if they do not have symptoms. Get tested again at least 5 days after the end of isolation for the person with COVID-19. Wear a well-fitting mask when you are around the person with COVID-19, and do this throughout their isolation period. Wear a well-fitting mask around others for 10 days after the infected person's isolation period ends. Isolate immediately if you develop symptoms of COVID-19 or test positive. What should I do if multiple people I live with test positive for COVID-19 at different times? Recommendations for this situation depend on your vaccination status: If you are not up to date with your COVID-19 vaccines, you should: Quarantine throughout the isolation period of any infected person that you live with. Continue to quarantine until 5 days after the end of isolation date for the most recently infected person that lives with you. For example, if   the last day of isolation of the person most recently infected with COVID-19 was June 30, the new 5-day quarantine period starts on July 1. Get tested at least 5 days after the end  of isolation for the most recently infected person that lives with you. Wear a well-fitting mask when you are around any person with COVID-19 while that person is in isolation. Wear a well-fitting mask when you are around other people until 10 days after your last close contact. Isolate immediately if you develop symptoms of COVID-19 or test positive. If you are up to date with your COVID-19 vaccines, you should: Get tested at least 5 days after your first exposure. A person with COVID-19 is considered infectious starting 2 days before they developed symptoms, or 2 days before the date of their positive test if they do not have symptoms. Get tested again at least 5 days after the end of isolation for the most recently infected person that lives with you. Wear a well-fitting mask when you are around any person with COVID-19 while that person is in isolation. Wear a well-fitting mask around others for 10 days after the end of isolation for the most recently infected person that lives with you. For example, if the last day of isolation for the person most recently infected with COVID-19 was June 30, the new 10-day period to wear a well-fitting mask indoors in public starts on July 1. Isolate immediately if you develop symptoms of COVID-19 or test positive. I had COVID-19 and completed isolation. Do I have to quarantine or get tested if someone I live with gets COVID-19 shortly after I completed isolation? No. If you recently completed isolation and someone that lives with you tests positive for the virus that causes COVID-19 shortly after the end of your isolation period, you do not have to quarantine or get tested as long as you do not develop new symptoms. Once all of the people that live together have completed isolation or quarantine, refer to the guidance below for new exposures to COVID-19. If you had COVID-19 in the previous 90 days and then came into close contact with someone with COVID-19, you do  not have to quarantine or get tested if you do not have symptoms. But you should: Wear a well-fitting mask indoors in public for 10 days after your last close contact. Monitor for COVID-19 symptoms for 10 days from the date of your last close contact. Isolate immediately and get tested if symptoms develop. If more than 90 days have passed since your recovery from infection, follow CDC's recommendations for close contacts. These recommendations will differ depending on your vaccination status. 02/07/2021 Content source: National Center for Immunization and Respiratory Diseases (NCIRD), Division of Viral Diseases This information is not intended to replace advice given to you by your health care provider. Make sure you discuss any questions you have with your health care provider. Document Revised: 03/15/2021 Document Reviewed: 03/15/2021 Elsevier Patient Education  2022 Elsevier Inc.  

## 2021-08-14 ENCOUNTER — Other Ambulatory Visit: Payer: Self-pay | Admitting: Nurse Practitioner

## 2021-08-14 ENCOUNTER — Telehealth: Payer: Self-pay | Admitting: Family Medicine

## 2021-08-14 LAB — SARS-COV-2, NAA 2 DAY TAT

## 2021-08-14 LAB — NOVEL CORONAVIRUS, NAA: SARS-CoV-2, NAA: DETECTED — AB

## 2021-08-14 MED ORDER — MOLNUPIRAVIR EUA 200MG CAPSULE: 4.0000 | ORAL_CAPSULE | Freq: Two times a day (BID) | ORAL | 0 refills | Status: AC

## 2021-08-14 NOTE — Progress Notes (Signed)
Molnipivir sent to pharmacy

## 2021-08-14 NOTE — Telephone Encounter (Signed)
Please review lab

## 2021-08-15 NOTE — Progress Notes (Signed)
Pt r/c.

## 2021-08-24 DIAGNOSIS — G5602 Carpal tunnel syndrome, left upper limb: Secondary | ICD-10-CM | POA: Insufficient documentation

## 2021-08-27 IMAGING — DX DG SHOULDER 1V*L*
1 series · 1 of 1 positions shown · non-contrast
Comparison: Portable exam 5154 hours compared to 12/01/2020

CLINICAL DATA: Post LEFT shoulder replacement surgery

EXAM:
LEFT SHOULDER

[shoulder ap]
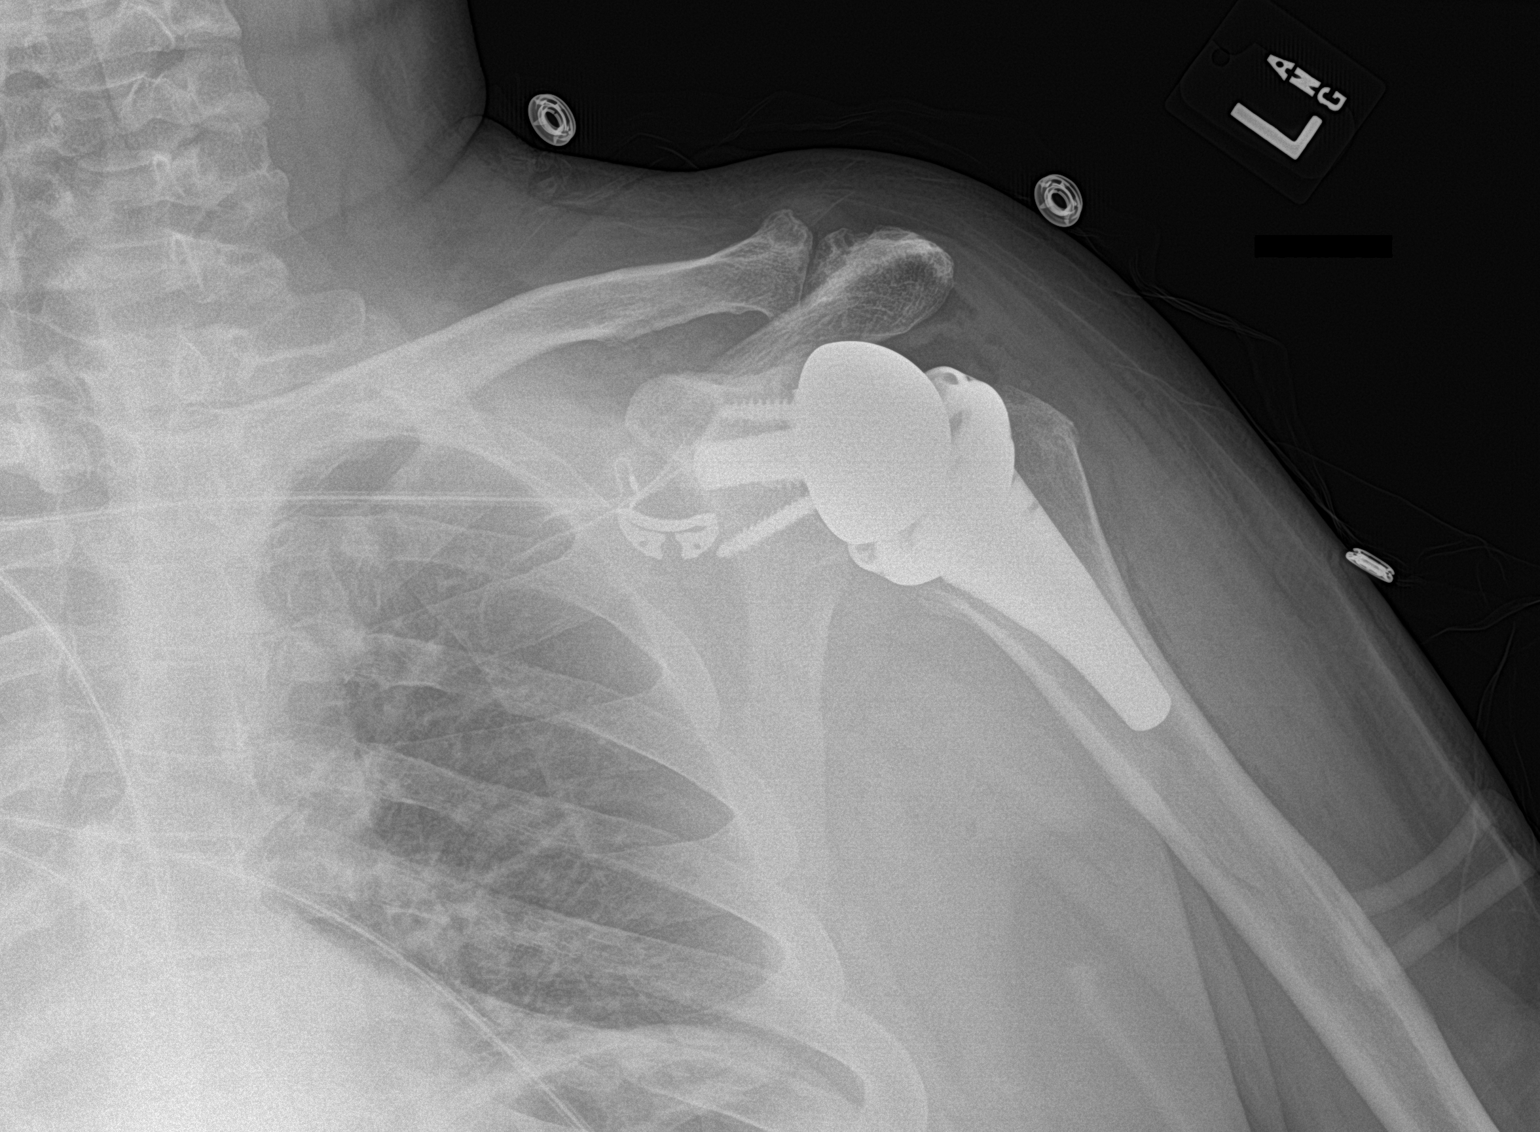

[1 of 1 positions shown; findings below may reference images not displayed]

FINDINGS: Interval placement of a reverse LEFT shoulder prosthesis.

Osseous demineralization.

Degenerative changes LEFT AC joint.

No fracture, dislocation, or bone destruction.
IMPRESSION: LEFT reverse shoulder arthroplasty without acute abnormalities.

## 2021-10-02 ENCOUNTER — Encounter: Payer: Self-pay | Admitting: Physician Assistant
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
## 2021-10-03 ENCOUNTER — Ambulatory Visit (INDEPENDENT_AMBULATORY_CARE_PROVIDER_SITE_OTHER): Payer: 59 | Admitting: Physician Assistant

## 2021-10-03 ENCOUNTER — Telehealth: Payer: Self-pay | Admitting: Cardiology

## 2021-10-03 ENCOUNTER — Other Ambulatory Visit: Payer: Self-pay

## 2021-10-03 ENCOUNTER — Encounter: Payer: Self-pay | Admitting: Physician Assistant

## 2021-10-03 VITALS — BP 130/90 | HR 81 | Ht 66.0 in | Wt 193.0 lb

## 2021-10-03 DIAGNOSIS — I340 Nonrheumatic mitral (valve) insufficiency: Secondary | ICD-10-CM

## 2021-10-03 DIAGNOSIS — I77819 Aortic ectasia, unspecified site: Secondary | ICD-10-CM | POA: Diagnosis not present

## 2021-10-03 DIAGNOSIS — R0609 Other forms of dyspnea: Secondary | ICD-10-CM

## 2021-10-03 DIAGNOSIS — I351 Nonrheumatic aortic (valve) insufficiency: Secondary | ICD-10-CM | POA: Diagnosis not present

## 2021-10-03 DIAGNOSIS — R079 Chest pain, unspecified: Secondary | ICD-10-CM | POA: Diagnosis not present

## 2021-10-03 DIAGNOSIS — E785 Hyperlipidemia, unspecified: Secondary | ICD-10-CM

## 2021-10-03 DIAGNOSIS — R030 Elevated blood-pressure reading, without diagnosis of hypertension: Secondary | ICD-10-CM

## 2021-10-03 LAB — TROPONIN T: Troponin T (Highly Sensitive): 13 ng/L (ref 0–22)

## 2021-10-03 MED ORDER — METOPROLOL TARTRATE 100 MG PO TABS: ORAL_TABLET | ORAL | 0 refills | Status: DC

## 2021-10-03 NOTE — Telephone Encounter (Signed)
Pt's troponin T was normal.  I called pt and left message with good news but would still need cardiac cta and echo.

## 2021-10-03 NOTE — Patient Instructions (Addendum)
Medication Instructions:  Your physician recommends that you continue on your current medications as directed. Please refer to the Current Medication list given to you today.  *If you need a refill on your cardiac medications before your next appointment, please call your pharmacy*   Lab Work: TODAY:  CMET, LIPID, CBC, TSH, & STAT TROPONIN  If you have labs (blood work) drawn today and your tests are completely normal, you will receive your results only by: MyChart Message (if you have MyChart) OR A paper copy in the mail If you have any lab test that is abnormal or we need to change your treatment, we will call you to review the results.   Testing/Procedures: Your physician has requested that you have an echocardiogram. Echocardiography is a painless test that uses sound waves to create images of your heart. It provides your doctor with information about the size and shape of your heart and how well your heart's chambers and valves are working. This procedure takes approximately one hour. There are no restrictions for this procedure.  Your physician has requested that you have cardiac CT. Cardiac computed tomography (CT) is a painless test that uses an x-ray machine to take clear, detailed pictures of your heart. For further information please visit [email or web address] Please follow instruction sheet as BELOW:    Your cardiac CT will be scheduled at one of the below locations:   [facility] [address] [place], [place] [address] [phone]  If scheduled at [facility], please arrive at the [facility] main entrance (entrance A) of [facility] 30 minutes prior to test start time. You can use the FREE valet parking offered at the main entrance (encouraged to control the heart rate for the test) Proceed to the [facility] Radiology Department (first floor) to check-in and test prep.   Please follow these instructions carefully (unless otherwise  directed):  Hold all erectile dysfunction medications at least 3 days (72 hrs) prior to test.  On the Night Before the Test: Be sure to Drink plenty of water. Do not consume any caffeinated/decaffeinated beverages or chocolate 12 hours prior to your test. Do not take any antihistamines 12 hours prior to your test.   On the Day of the Test: Drink plenty of water until 1 hour prior to the test. Do not eat any food 4 hours prior to the test. You may take your regular medications prior to the test.  Take metoprolol (Lopressor) two hours prior to test.       After the Test: Drink plenty of water. After receiving IV contrast, you may experience a mild flushed feeling. This is normal. On occasion, you may experience a mild rash up to 24 hours after the test. This is not dangerous. If this occurs, you can take Benadryl 25 mg and increase your fluid intake. If you experience trouble breathing, this can be serious. If it is severe call 911 IMMEDIATELY. If it is mild, please call our office. If you take any of these medications: Glipizide/Metformin, Avandament, Glucavance, please do not take 48 hours after completing test unless otherwise instructed.  Please allow 2-4 weeks for scheduling of routine cardiac CTs. Some insurance companies require a pre-authorization which may delay scheduling of this test.   For non-scheduling related questions, please contact the cardiac imaging nurse navigator should you have any questions/concerns: Rockwell Alexandria, Cardiac Imaging Nurse Navigator Larey Brick, Cardiac Imaging Nurse Navigator [facility] Direct Office Dial: [phone]  For scheduling needs, including cancellations and rescheduling, please call [place], [phone].    Follow-Up: At [facility], you and your health needs are our priority.  As part of our continuing mission to provide you with exceptional heart care, we have created designated Provider Care  Teams.  These Care Teams include your primary Cardiologist (physician) and Advanced Practice Providers (APPs -  Physician Assistants and Nurse Practitioners) who all work together to provide you with the care you need, when you need it.  We recommend signing up for the patient portal called "MyChart".  Sign up information is provided on this After Visit Summary.  MyChart is used to connect with patients for Virtual Visits (Telemedicine).  Patients are able to view lab/test results, encounter notes, upcoming appointments, etc.  Non-urgent messages can be sent to your provider as well.   To learn more about what you can do with MyChart, go to [email or web address].    Your next appointment:   AFTER TESTING WITH DAYNA DUNN, PA-C OR AVAILABLE APP   The format for your next appointment:   In Person  Provider:   Ronie Spies, PA-C        Other Instructions  We need to get a better idea of what your blood pressure is running at home. Here are some instructions to follow: - I would recommend using a blood pressure cuff that goes on your arm. The wrist ones can be inaccurate. If you're purchasing one for the first time, try to select one that also reports your heart rate because this can be helpful information as well. - To check your blood pressure, choose a time at least 3 hours after taking your blood pressure medicines. If you can sample it at different times of the day, that's great - it might give you more information about how your blood pressure fluctuates. Remain seated in a chair for 5 minutes quietly beforehand, then check it.  - Please record a list of those readings. Please monitor your blood pressure occasionally at home. Call us if you tend to get readings of greater than 130 on the top number or 80 on the bottom number.

## 2021-10-04 LAB — LIPID PANEL
Chol/HDL Ratio: 5.1 ratio — ABNORMAL HIGH (ref 0.0–5.0)
Cholesterol, Total: 224 mg/dL — ABNORMAL HIGH (ref 100–199)
HDL: 44 mg/dL (ref >39)
LDL Chol Calc (NIH): 163 mg/dL — ABNORMAL HIGH (ref 0–99)
Triglycerides: 93 mg/dL (ref 0–149)
VLDL Cholesterol Cal: 17 mg/dL (ref 5–40)

## 2021-10-04 LAB — COMPREHENSIVE METABOLIC PANEL
ALT: 28 IU/L (ref 0–44)
AST: 24 IU/L (ref 0–40)
Albumin/Globulin Ratio: 2.2 (ref 1.2–2.2)
Albumin: 5 g/dL — ABNORMAL HIGH (ref 3.8–4.9)
Alkaline Phosphatase: 73 IU/L (ref 44–121)
BUN/Creatinine Ratio: 15 (ref 9–20)
BUN: 12 mg/dL (ref 6–24)
Bilirubin Total: 0.5 mg/dL (ref 0.0–1.2)
CO2: 20 mmol/L (ref 20–29)
Calcium: 9.8 mg/dL (ref 8.7–10.2)
Chloride: 100 mmol/L (ref 96–106)
Creatinine, Ser: 0.79 mg/dL (ref 0.76–1.27)
Globulin, Total: 2.3 g/dL (ref 1.5–4.5)
Glucose: 78 mg/dL (ref 70–99)
Potassium: 4.1 mmol/L (ref 3.5–5.2)
Sodium: 136 mmol/L (ref 134–144)
Total Protein: 7.3 g/dL (ref 6.0–8.5)
eGFR: 106 mL/min/{1.73_m2} (ref >59)

## 2021-10-04 LAB — CBC
Hematocrit: 44.7 % (ref 37.5–51.0)
Hemoglobin: 15.7 g/dL (ref 13.0–17.7)
MCH: 30.4 pg (ref 26.6–33.0)
MCHC: 35.1 g/dL (ref 31.5–35.7)
MCV: 87 fL (ref 79–97)
Platelets: 292 10*3/uL (ref 150–450)
RBC: 5.16 x10E6/uL (ref 4.14–5.80)
RDW: 12.1 % (ref 11.6–15.4)
WBC: 7.8 10*3/uL (ref 3.4–10.8)

## 2021-10-04 LAB — TSH: TSH: 2.14 u[IU]/mL (ref 0.450–4.500)

## 2021-10-05 NOTE — Telephone Encounter (Signed)
Noted, continue plan as outlined in echo/CT.

## 2021-10-08 ENCOUNTER — Telehealth: Payer: Self-pay | Admitting: *Deleted

## 2021-10-08 DIAGNOSIS — Z79899 Other long term (current) drug therapy: Secondary | ICD-10-CM

## 2021-10-08 MED ORDER — ROSUVASTATIN CALCIUM 10 MG PO TABS: 10.0000 mg | ORAL_TABLET | Freq: Every day | ORAL | 3 refills | Status: DC

## 2021-10-08 NOTE — Telephone Encounter (Signed)
-----   Message from Laurann Montana, [place] sent at 10/04/2021 10:08 AM EST ----- Labs stable except cholesterol remains quite high. Recommend starting rosuvastatin 10mg  daily with followup fasting liver/lipids in 6 weeks. I. already notified pt on voicemail of normal troponin (she was on call and received stat lab).

## 2021-10-09 ENCOUNTER — Other Ambulatory Visit: Payer: Self-pay | Admitting: *Deleted

## 2021-10-09 ENCOUNTER — Other Ambulatory Visit: Payer: Self-pay

## 2021-10-09 ENCOUNTER — Ambulatory Visit (INDEPENDENT_AMBULATORY_CARE_PROVIDER_SITE_OTHER): Payer: 59 | Admitting: Internal Medicine

## 2021-10-09 ENCOUNTER — Encounter: Payer: Self-pay | Admitting: Internal Medicine

## 2021-10-09 ENCOUNTER — Telehealth: Payer: Self-pay | Admitting: *Deleted

## 2021-10-09 VITALS — BP 136/85 | HR 67 | Temp 97.7°F | Ht 66.0 in | Wt 196.4 lb

## 2021-10-09 DIAGNOSIS — R1032 Left lower quadrant pain: Secondary | ICD-10-CM

## 2021-10-09 DIAGNOSIS — R194 Change in bowel habit: Secondary | ICD-10-CM

## 2021-10-09 DIAGNOSIS — R109 Unspecified abdominal pain: Secondary | ICD-10-CM | POA: Diagnosis not present

## 2021-10-09 NOTE — Patient Instructions (Signed)
It was good to see you again today!  As discussed, we need to proceed with a CT scan of the abdomen and pelvis with IV/oral contrast to further evaluate left lower quadrant abdominal pain and change in bowel habits.  Will also need to get a CBC and a BE MET tomorrow.  Once I have the lab work and CT report for review I will be back in touch with you.

## 2021-10-09 NOTE — Telephone Encounter (Signed)
PA approved via AIM for CT A/P. Auth # [id number], DOS 10/09/2021 - 11/07/2021
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
## 2021-10-10 NOTE — Telephone Encounter (Signed)
Called pt. He was originally scheduled for AP CT 12/7 at 4:00pm. Was able to get sooner CT in [place] [facility] for 12/2 at 2:30pm, arrival 2:15pm, npo 4 hrs prior, p/u oral prep. Per Sheralyn Boatman pt can pick prep up at [facility] radiology.  Called pt and made aware of appt recs. He voiced understanding.

## 2021-10-11 LAB — CBC WITH DIFFERENTIAL/PLATELET
Absolute Monocytes: 553 cells/uL (ref 200–950)
Basophils Absolute: 63 cells/uL (ref 0–200)
Basophils Relative: 0.9 %
Eosinophils Absolute: 133 cells/uL (ref 15–500)
Eosinophils Relative: 1.9 %
HCT: 45.6 % (ref 38.5–50.0)
Hemoglobin: 15.3 g/dL (ref 13.2–17.1)
Lymphs Abs: 1659 cells/uL (ref 850–3900)
MCH: 30.5 pg (ref 27.0–33.0)
MCHC: 33.6 g/dL (ref 32.0–36.0)
MCV: 91 fL (ref 80.0–100.0)
MPV: 10.3 fL (ref 7.5–12.5)
Monocytes Relative: 7.9 %
Neutro Abs: 4592 cells/uL (ref 1500–7800)
Neutrophils Relative %: 65.6 %
Platelets: 271 10*3/uL (ref 140–400)
RBC: 5.01 10*6/uL (ref 4.20–5.80)
RDW: 12.4 % (ref 11.0–15.0)
Total Lymphocyte: 23.7 %
WBC: 7 10*3/uL (ref 3.8–10.8)

## 2021-10-11 LAB — BASIC METABOLIC PANEL
BUN: 21 mg/dL (ref 7–25)
CO2: 24 mmol/L (ref 20–32)
Calcium: 9.8 mg/dL (ref 8.6–10.3)
Chloride: 105 mmol/L (ref 98–110)
Creat: 0.88 mg/dL (ref 0.70–1.30)
Glucose, Bld: 90 mg/dL (ref 65–99)
Potassium: 5 mmol/L (ref 3.5–5.3)
Sodium: 139 mmol/L (ref 135–146)

## 2021-10-12 ENCOUNTER — Other Ambulatory Visit: Payer: Self-pay

## 2021-10-12 ENCOUNTER — Ambulatory Visit (HOSPITAL_BASED_OUTPATIENT_CLINIC_OR_DEPARTMENT_OTHER)
Admission: RE | Admit: 2021-10-12 | Discharge: 2021-10-12 | Disposition: A | Discharge status: Home / Self Care | Payer: 59 | Source: Ambulatory Visit | Attending: Internal Medicine | Admitting: Internal Medicine

## 2021-10-12 DIAGNOSIS — R194 Change in bowel habit: Secondary | ICD-10-CM | POA: Insufficient documentation

## 2021-10-12 DIAGNOSIS — R1032 Left lower quadrant pain: Secondary | ICD-10-CM | POA: Diagnosis present

## 2021-10-12 MED ORDER — IOHEXOL 300 MG/ML  SOLN
100.0000 mL | Freq: Once | INTRAMUSCULAR | Status: AC | PRN
  Administered 2021-10-12: 100 mL via INTRAVENOUS

## 2021-10-15 ENCOUNTER — Telehealth: Payer: Self-pay | Admitting: Internal Medicine

## 2021-10-15 NOTE — Telephone Encounter (Signed)
Patient called about his ct results.. wants to speak to someone about them

## 2021-10-15 NOTE — Telephone Encounter (Signed)
Informed pt that we would contact him once Dr. Jena Gauss has a chance to look over it. Pt stated that he seen the results on mychart. Pt verbalized understanding.

## 2021-10-16 ENCOUNTER — Encounter: Payer: Self-pay | Admitting: Internal Medicine

## 2021-10-17 ENCOUNTER — Encounter (HOSPITAL_COMMUNITY): Payer: Self-pay

## 2021-10-17 ENCOUNTER — Ambulatory Visit (HOSPITAL_COMMUNITY): Payer: 59

## 2021-10-17 ENCOUNTER — Telehealth (HOSPITAL_COMMUNITY): Payer: Self-pay | Admitting: Emergency Medicine

## 2021-10-17 NOTE — Telephone Encounter (Signed)
Reaching out to patient to offer assistance regarding upcoming cardiac imaging study; pt verbalizes understanding of appt date/time, parking situation and where to check in, pre-test NPO status and medications ordered, and verified current allergies; name and call back number provided for further questions should they arise Rockwell Alexandria RN Navigator Cardiac Imaging [facility] [phone] office [phone] cell  100mg  metoprolol tart Denies iv issues Arrival 1130a

## 2021-10-18 ENCOUNTER — Ambulatory Visit (HOSPITAL_COMMUNITY)
Admission: RE | Admit: 2021-10-18 | Discharge: 2021-10-18 | Disposition: A | Discharge status: Home / Self Care | Payer: 59 | Source: Ambulatory Visit | Attending: Physician Assistant | Admitting: Physician Assistant

## 2021-10-18 ENCOUNTER — Encounter (HOSPITAL_COMMUNITY): Payer: Self-pay

## 2021-10-18 ENCOUNTER — Other Ambulatory Visit: Payer: Self-pay

## 2021-10-18 DIAGNOSIS — R079 Chest pain, unspecified: Secondary | ICD-10-CM

## 2021-10-18 MED ORDER — NITROGLYCERIN 0.4 MG SL SUBL
SUBLINGUAL_TABLET | SUBLINGUAL | Status: AC
  Filled 2021-10-18: qty 2

## 2021-10-18 MED ORDER — NITROGLYCERIN 0.4 MG SL SUBL
0.8000 mg | SUBLINGUAL_TABLET | Freq: Once | SUBLINGUAL | Status: AC
  Administered 2021-10-18: 0.8 mg via SUBLINGUAL

## 2021-10-18 MED ORDER — IOHEXOL 350 MG/ML SOLN
95.0000 mL | Freq: Once | INTRAVENOUS | Status: AC | PRN
  Administered 2021-10-18: 95 mL via INTRAVENOUS

## 2021-10-18 NOTE — Progress Notes (Signed)
CT scan completed. Tolerate well. D/C home ambulatory, awake and alert. In no distress 

## 2021-10-22 ENCOUNTER — Telehealth: Payer: Self-pay | Admitting: Interventional Cardiology

## 2021-10-22 MED ORDER — ROSUVASTATIN CALCIUM 20 MG PO TABS: 20.0000 mg | ORAL_TABLET | Freq: Every day | ORAL | 3 refills | Status: DC

## 2021-10-22 NOTE — Telephone Encounter (Signed)
-----   Message from Laurann Montana, [place] sent at 10/18/2021  1:59 PM EST ----- Heart portion came back fairly quickly! This showed only mild coronary artery disease, which is reassuring. This means there is plaque buildup present but it is not impairing the blood flow at all. Would not be causing symptoms at this time. We'll await his echo. The major things to focus on now are cholesterol and blood pressure. Avoid smoking/street drugs and limit alcohol. I previously recommended starting rosuvastatin 10mg  based on last visit but given that we do see some early evidence of atherosclerosis, given his family history and cholesterol numbers, would suggest we bump that up to 20mg  daily - can use up the 10mg  tabs he has by taking 2 daily then start 20mg  tabs. Push January labs out 2 more weeks given the dose increase. Make sure he comes fasting. Thanks.

## 2021-10-22 NOTE — Telephone Encounter (Signed)
Pt is f/u on  CT results [phone]

## 2021-10-26 ENCOUNTER — Ambulatory Visit (HOSPITAL_COMMUNITY): Payer: 59 | Attending: Physician Assistant

## 2021-10-26 ENCOUNTER — Other Ambulatory Visit: Payer: Self-pay

## 2021-10-26 DIAGNOSIS — I351 Nonrheumatic aortic (valve) insufficiency: Secondary | ICD-10-CM

## 2021-10-26 DIAGNOSIS — I340 Nonrheumatic mitral (valve) insufficiency: Secondary | ICD-10-CM

## 2021-10-26 DIAGNOSIS — R030 Elevated blood-pressure reading, without diagnosis of hypertension: Secondary | ICD-10-CM

## 2021-10-26 DIAGNOSIS — E785 Hyperlipidemia, unspecified: Secondary | ICD-10-CM | POA: Diagnosis present

## 2021-10-26 DIAGNOSIS — R0609 Other forms of dyspnea: Secondary | ICD-10-CM | POA: Diagnosis not present

## 2021-10-26 DIAGNOSIS — R079 Chest pain, unspecified: Secondary | ICD-10-CM | POA: Diagnosis present

## 2021-10-26 DIAGNOSIS — I77819 Aortic ectasia, unspecified site: Secondary | ICD-10-CM | POA: Diagnosis not present

## 2021-10-26 LAB — ECHOCARDIOGRAM COMPLETE
Area-P 1/2: 2.54 cm2
P 1/2 time: 598 msec
S' Lateral: 2 cm

## 2021-11-19 ENCOUNTER — Other Ambulatory Visit: Payer: 59

## 2021-12-10 ENCOUNTER — Other Ambulatory Visit: Payer: 59

## 2021-12-17 ENCOUNTER — Other Ambulatory Visit: Payer: Self-pay

## 2021-12-17 ENCOUNTER — Other Ambulatory Visit: Payer: 59 | Admitting: *Deleted

## 2021-12-17 DIAGNOSIS — Z79899 Other long term (current) drug therapy: Secondary | ICD-10-CM

## 2021-12-17 LAB — HEPATIC FUNCTION PANEL
ALT: 24 IU/L (ref 0–44)
AST: 19 IU/L (ref 0–40)
Albumin: 4.7 g/dL (ref 3.8–4.9)
Alkaline Phosphatase: 62 IU/L (ref 44–121)
Bilirubin Total: 0.6 mg/dL (ref 0.0–1.2)
Bilirubin, Direct: 0.17 mg/dL (ref 0.00–0.40)
Total Protein: 7 g/dL (ref 6.0–8.5)

## 2021-12-17 LAB — LIPID PANEL
Chol/HDL Ratio: 2.8 ratio (ref 0.0–5.0)
Cholesterol, Total: 130 mg/dL (ref 100–199)
HDL: 47 mg/dL (ref >39)
LDL Chol Calc (NIH): 69 mg/dL (ref 0–99)
Triglycerides: 65 mg/dL (ref 0–149)
VLDL Cholesterol Cal: 14 mg/dL (ref 5–40)

## 2021-12-19 ENCOUNTER — Telehealth: Payer: Self-pay | Admitting: Internal Medicine

## 2021-12-19 MED ORDER — METRONIDAZOLE 500 MG PO TABS: 500.0000 mg | ORAL_TABLET | Freq: Three times a day (TID) | ORAL | 0 refills | Status: AC

## 2021-12-19 MED ORDER — CIPROFLOXACIN HCL 500 MG PO TABS: 500.0000 mg | ORAL_TABLET | Freq: Two times a day (BID) | ORAL | 0 refills | Status: AC

## 2021-12-19 NOTE — Telephone Encounter (Signed)
Recent CT reviewed. Could consider brief course of oral antibiotics to treat presumed diverticulitis not appreciated on CT scan.   I sent in Cipro 500 mg po BID and Flagyl 500 mg TID to take 5 days. See how this does for him.

## 2021-12-19 NOTE — Addendum Note (Signed)
Addended by: Gelene Mink on: 12/19/2021 11:54 AM   Modules accepted: Orders

## 2021-12-19 NOTE — Telephone Encounter (Signed)
Pt states that he is still having lower left side abdominal pain and anal pressure. Pt states that it is not affecting his daily life but that he would like to find out what is going on. Pt is having normal bm's since starting benefiber as Dr. Gala Romney suggested at his last visit. I advised pt to go to the ER if symptoms get worse. Pt is on wait/cancellation list for a sooner appt.

## 2021-12-19 NOTE — Telephone Encounter (Signed)
Lmom for pt to return my call.  

## 2021-12-19 NOTE — Telephone Encounter (Signed)
Pt has OV on 4/13/22023 at 0900 with [facility] and is on the wait list. He wants to speak with nurse. [phone]

## 2021-12-21 NOTE — Telephone Encounter (Signed)
Pt was made aware and verbalized understanding.  
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
## 2021-12-24 ENCOUNTER — Other Ambulatory Visit: Payer: Self-pay

## 2021-12-24 ENCOUNTER — Encounter: Payer: Self-pay | Admitting: Interventional Cardiology

## 2021-12-24 ENCOUNTER — Ambulatory Visit: Payer: 59 | Admitting: Interventional Cardiology

## 2021-12-24 VITALS — BP 112/80 | HR 69 | Ht 66.0 in | Wt 196.6 lb

## 2021-12-24 DIAGNOSIS — R0609 Other forms of dyspnea: Secondary | ICD-10-CM | POA: Diagnosis not present

## 2021-12-24 DIAGNOSIS — I2510 Atherosclerotic heart disease of native coronary artery without angina pectoris: Secondary | ICD-10-CM

## 2021-12-24 DIAGNOSIS — I77810 Thoracic aortic ectasia: Secondary | ICD-10-CM

## 2021-12-24 DIAGNOSIS — I77819 Aortic ectasia, unspecified site: Secondary | ICD-10-CM

## 2021-12-24 DIAGNOSIS — I358 Other nonrheumatic aortic valve disorders: Secondary | ICD-10-CM | POA: Diagnosis not present

## 2021-12-24 DIAGNOSIS — I340 Nonrheumatic mitral (valve) insufficiency: Secondary | ICD-10-CM

## 2021-12-24 DIAGNOSIS — E785 Hyperlipidemia, unspecified: Secondary | ICD-10-CM

## 2021-12-24 DIAGNOSIS — I351 Nonrheumatic aortic (valve) insufficiency: Secondary | ICD-10-CM

## 2021-12-24 MED ORDER — ROSUVASTATIN CALCIUM 20 MG PO TABS: 20.0000 mg | ORAL_TABLET | Freq: Every day | ORAL | 3 refills | Status: DC

## 2021-12-24 NOTE — Patient Instructions (Signed)
Medication Instructions:  °Your physician recommends that you continue on your current medications as directed. Please refer to the Current Medication list given to you today. ° °*If you need a refill on your cardiac medications before your next appointment, please call your pharmacy* ° ° °Lab Work: °NONE °If you have labs (blood work) drawn today and your tests are completely normal, you will receive your results only by: °MyChart Message (if you have MyChart) OR °A paper copy in the mail °If you have any lab test that is abnormal or we need to change your treatment, we will call you to review the results. ° ° °Testing/Procedures: °NONE ° ° °Follow-Up: °At [facility], you and your health needs are our priority.  As part of our continuing mission to provide you with exceptional heart care, we have created designated Provider Care Teams.  These Care Teams include your primary Cardiologist (physician) and Advanced Practice Providers (APPs -  Physician Assistants and Nurse Practitioners) who all work together to provide you with the care you need, when you need it. ° °We recommend signing up for the patient portal called "MyChart".  Sign up information is provided on this After Visit Summary.  MyChart is used to connect with patients for Virtual Visits (Telemedicine).  Patients are able to view lab/test results, encounter notes, upcoming appointments, etc.  Non-urgent messages can be sent to your provider as well.   °To learn more about what you can do with MyChart, go to [email or web address].   ° °Your next appointment:   °1 year(s) ° °The format for your next appointment:   °In Person ° °Provider:   °Henry W Ziesmer III, MD   ° °

## 2021-12-25 ENCOUNTER — Telehealth: Payer: Self-pay

## 2021-12-25 NOTE — Telephone Encounter (Signed)
Please arrange office visit for 130 on 2/15. Thanks!

## 2021-12-25 NOTE — Telephone Encounter (Signed)
Pt called stating that the last two days his bm have been the consistency of coffee grounds and are dark red in color. Pt states that he takes the last of the antibiotics tonight. Pt is also still having the left lower abdominal discomfort.

## 2021-12-25 NOTE — Telephone Encounter (Signed)
Is pain in lower part of abdomen or more upper, just below his ribs?

## 2021-12-25 NOTE — Telephone Encounter (Signed)
Pt has been notified and is aware. 
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
## 2021-12-25 NOTE — Telephone Encounter (Signed)
Pt states no improvement. Pain is more significant after he eats. Pt states that he takes senakot to help with constipation so he isn't constipated or having diarrhea at the moment. Routing to the front as well to see about an earlier appointment.
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
## 2021-12-25 NOTE — Telephone Encounter (Signed)
He has had no improvement in pain at all? Recommend checking CBC. Any constipation? Any diarrhea? We need to bump his appt up if at all possible.

## 2021-12-25 NOTE — Telephone Encounter (Signed)
Left lower just below ribs

## 2021-12-26 ENCOUNTER — Other Ambulatory Visit: Payer: Self-pay

## 2021-12-26 ENCOUNTER — Encounter: Payer: Self-pay | Admitting: Gastroenterology

## 2021-12-26 ENCOUNTER — Ambulatory Visit: Payer: 59 | Admitting: Gastroenterology

## 2021-12-26 VITALS — BP 123/81 | HR 65 | Temp 97.7°F | Ht 66.0 in | Wt 198.2 lb

## 2021-12-26 DIAGNOSIS — R194 Change in bowel habit: Secondary | ICD-10-CM

## 2021-12-26 DIAGNOSIS — K219 Gastro-esophageal reflux disease without esophagitis: Secondary | ICD-10-CM

## 2021-12-26 DIAGNOSIS — R1012 Left upper quadrant pain: Secondary | ICD-10-CM | POA: Insufficient documentation

## 2021-12-26 DIAGNOSIS — K5900 Constipation, unspecified: Secondary | ICD-10-CM | POA: Insufficient documentation

## 2021-12-26 NOTE — Patient Instructions (Signed)
For your constipation, I want you to start taking over the counter MiraLAX 1 capful daily for a few days and if you feel like you are not emptying completely then increase to 2 capfuls daily.  If this is still not adequate, then I would add on once daily Dulcolax (bisacodyl) tablet.   I also recommend increasing fiber in your diet (continue eating bananas and the high fiber fruit). Be sure to drink at least 4 to 6 glasses of water daily, this is important.   We are scheduling you for an upper endoscopy with Dr. Marletta Lor to further evaluate your GERD symptoms and your LUQ pain.   Follow up in about 2 months post procedure or sooner if pain worsens.   It was a pleasure to meet you today. I want to create trusting relationships with patients. If you receive a survey regarding your visit,  I greatly appreciate you taking time to fill this out on paper or through your MyChart. I value your feedback.  Brooke Bonito, MSN, FNP-BC, AGACNP-BC [facility]

## 2021-12-28 ENCOUNTER — Telehealth: Payer: Self-pay

## 2021-12-28 ENCOUNTER — Telehealth: Payer: Self-pay | Admitting: Gastroenterology

## 2021-12-28 DIAGNOSIS — R1012 Left upper quadrant pain: Secondary | ICD-10-CM

## 2021-12-28 DIAGNOSIS — R194 Change in bowel habit: Secondary | ICD-10-CM

## 2021-12-28 LAB — CBC WITH DIFFERENTIAL/PLATELET
Absolute Monocytes: 537 cells/uL (ref 200–950)
Basophils Absolute: 79 cells/uL (ref 0–200)
Basophils Relative: 1.3 %
Eosinophils Absolute: 67 cells/uL (ref 15–500)
Eosinophils Relative: 1.1 %
HCT: 44.5 % (ref 38.5–50.0)
Hemoglobin: 15.5 g/dL (ref 13.2–17.1)
Lymphs Abs: 1995 cells/uL (ref 850–3900)
MCH: 31.2 pg (ref 27.0–33.0)
MCHC: 34.8 g/dL (ref 32.0–36.0)
MCV: 89.5 fL (ref 80.0–100.0)
MPV: 10.3 fL (ref 7.5–12.5)
Monocytes Relative: 8.8 %
Neutro Abs: 3422 cells/uL (ref 1500–7800)
Neutrophils Relative %: 56.1 %
Platelets: 248 10*3/uL (ref 140–400)
RBC: 4.97 10*6/uL (ref 4.20–5.80)
RDW: 12 % (ref 11.0–15.0)
Total Lymphocyte: 32.7 %
WBC: 6.1 10*3/uL (ref 3.8–10.8)

## 2021-12-28 NOTE — Telephone Encounter (Signed)
Communication noted.  

## 2021-12-28 NOTE — Telephone Encounter (Signed)
Spoke with pt and he wanted to know why we were going with the EGD first. Informed pt that according to note it was due to color of stools, abd pain, and ongoing reflux symptoms. Pt also states that at his appt he was instructed to take a pkt of miralax daily to help with constipation. Pt states that he took the first pkt and had a bm that was loose. Pt states that on that day his pain subsided. Pt drank a pkt on the second day and states that he had two bm's and that the pain was gone that day as well. Pt stated that when he woke up this morning he had a bm that looked like it did in the pictures that he showed to Paul at his visit and was like coffee grounds. Pt took the third pkt of miralax around 7:30 this morning and has had 3 loose bm's so far. Pt states the last one he was unable to make it to the bathroom in time and messed himself. Pt informed me that he started taking a new cholesterol medicine about a month ago around the same time the constipation started and is wondering if that is contributing to his constipation issues. Pt is on rosuvastatin 20 mg.

## 2021-12-28 NOTE — Telephone Encounter (Signed)
Spoke to patient over the phone. He called the office today with questions about why we are doing an upper endoscopy and colonoscopy and to report frequent loose BMs.  He also wanted to know if his new cholesterol medication that he started around a month ago could be contributing to constipation issues.   Upon speaking with the patient I advised him to not take the [facility] tomorrow and pending the consistency of his bowel movements he could resume it on Sunday morning or Monday morning.  He reported some discomfort this morning around his left upper quadrant but reports that it is not radiating to his back and that its not sharp pain just discomfort/pressure.  He reiterates that the way he has bowel movements have been looking are not normal for him which is concerning him.  I told him that rosuvastatin does not typically cause constipation or abdominal pain that is only shown to cause this in about 3 to 5% of patients.  He also stated that he recently was started on a medication from his heart doctor and was curious as to whether or not this could contribute to his symptoms.  He stated he would speak to the pharmacist or call his heart doctor to query about this.  With his report of darker colored stools this morning I let him know that I would like to draw a CBC and look for any anemia and he stated he would have this done today at [facility].  I reinforced the reasoning for the EGD first regarding his symptoms and advised him that we would follow-up and pursue other testing if nothing is found on the EGD.  I also advised him to increase his Benefiber to twice daily and we will monitor his bowel habits over the weekend and into the early next week.  He knows to contact the office if he has further issues or any other questions, and that we will contact him with his lab results.   Brooke Bonito, MSN, FNP-BC, AGACNP-BC [facility]

## 2022-01-11 ENCOUNTER — Encounter (HOSPITAL_COMMUNITY)
Admission: RE | Disposition: A | Discharge status: Home / Self Care | Payer: Self-pay | Source: Home / Self Care | Attending: Internal Medicine

## 2022-01-11 ENCOUNTER — Other Ambulatory Visit: Payer: Self-pay

## 2022-01-11 ENCOUNTER — Ambulatory Visit (HOSPITAL_BASED_OUTPATIENT_CLINIC_OR_DEPARTMENT_OTHER): Payer: 59 | Admitting: Anesthesiology

## 2022-01-11 ENCOUNTER — Encounter (HOSPITAL_COMMUNITY): Payer: Self-pay

## 2022-01-11 ENCOUNTER — Ambulatory Visit (HOSPITAL_COMMUNITY): Payer: 59 | Admitting: Anesthesiology

## 2022-01-11 ENCOUNTER — Ambulatory Visit (HOSPITAL_COMMUNITY)
Admission: RE | Admit: 2022-01-11 | Discharge: 2022-01-11 | Disposition: A | Discharge status: Home / Self Care | Payer: 59 | Attending: Internal Medicine | Admitting: Internal Medicine

## 2022-01-11 DIAGNOSIS — R12 Heartburn: Secondary | ICD-10-CM | POA: Insufficient documentation

## 2022-01-11 DIAGNOSIS — K2970 Gastritis, unspecified, without bleeding: Secondary | ICD-10-CM | POA: Insufficient documentation

## 2022-01-11 DIAGNOSIS — D649 Anemia, unspecified: Secondary | ICD-10-CM | POA: Diagnosis not present

## 2022-01-11 DIAGNOSIS — R194 Change in bowel habit: Secondary | ICD-10-CM | POA: Insufficient documentation

## 2022-01-11 DIAGNOSIS — R1012 Left upper quadrant pain: Secondary | ICD-10-CM | POA: Diagnosis not present

## 2022-01-11 DIAGNOSIS — Z79899 Other long term (current) drug therapy: Secondary | ICD-10-CM | POA: Insufficient documentation

## 2022-01-11 DIAGNOSIS — K219 Gastro-esophageal reflux disease without esophagitis: Secondary | ICD-10-CM | POA: Diagnosis not present

## 2022-01-11 DIAGNOSIS — I080 Rheumatic disorders of both mitral and aortic valves: Secondary | ICD-10-CM | POA: Insufficient documentation

## 2022-01-11 HISTORY — PX: ESOPHAGOGASTRODUODENOSCOPY (EGD) WITH PROPOFOL: SHX5813

## 2022-01-11 HISTORY — PX: BIOPSY: SHX5522

## 2022-01-11 SURGERY — ESOPHAGOGASTRODUODENOSCOPY (EGD) WITH PROPOFOL
Anesthesia: General

## 2022-01-11 MED ORDER — LACTATED RINGERS IV SOLN: INTRAVENOUS | Status: DC

## 2022-01-11 MED ORDER — LIDOCAINE HCL (CARDIAC) PF 100 MG/5ML IV SOSY
PREFILLED_SYRINGE | INTRAVENOUS | Status: DC | PRN
  Administered 2022-01-11: 50 mg via INTRAVENOUS

## 2022-01-11 MED ORDER — PROPOFOL 10 MG/ML IV BOLUS
INTRAVENOUS | Status: DC | PRN
  Administered 2022-01-11: 100 mg via INTRAVENOUS

## 2022-01-11 NOTE — Anesthesia Postprocedure Evaluation (Signed)
Anesthesia Post Note ? ?Patient: Alex Wilson ? ?Procedure(s) Performed: ESOPHAGOGASTRODUODENOSCOPY (EGD) WITH PROPOFOL ?BIOPSY ? ?Patient location during evaluation: Phase II ?Anesthesia Type: General ?Level of consciousness: awake and alert and oriented ?Pain management: pain level controlled ?Vital Signs Assessment: post-procedure vital signs reviewed and stable ?Respiratory status: spontaneous breathing, nonlabored ventilation and respiratory function stable ?Cardiovascular status: blood pressure returned to baseline and stable ?Postop Assessment: no apparent nausea or vomiting ?Anesthetic complications: no ? ? ?No notable events documented. ? ? ?Last Vitals:  ?Vitals:  ? 01/11/22 1056 01/11/22 1058  ?BP: 93/63 (!) 99/56  ?Pulse: 73 73  ?Resp: 18 18  ?Temp: (!) 36.4 ?C   ?SpO2: 94% 97%  ?  ?Last Pain:  ?Vitals:  ? 01/11/22 1058  ?TempSrc:   ?PainSc: 0-No pain  ? ? ?  ?  ?  ?  ?  ?  ? ?Carolin Quang C Alex Wilson ? ? ? ? ?

## 2022-01-11 NOTE — Anesthesia Preprocedure Evaluation (Addendum)
Anesthesia Evaluation  ?Patient identified by MRN, date of birth, ID band ?Patient awake ? ? ? ?Reviewed: ?Allergy & Precautions, NPO status , Patient's Chart, lab work & pertinent test results ? ?Airway ?Mallampati: II ? ?TM Distance: >3 FB ?Neck ROM: Full ? ? ? Dental ? ?(+) Dental Advisory Given, Missing ?  ?Pulmonary ?neg pulmonary ROS,  ?  ?Pulmonary exam normal ?breath sounds clear to auscultation ? ? ? ? ? ? Cardiovascular ?+ Valvular Problems/Murmurs  ?Rhythm:Regular Rate:Normal ?+ Systolic murmurs ? ?  ?Neuro/Psych ?PSYCHIATRIC DISORDERS Anxiety negative neurological ROS ?   ? GI/Hepatic ?GERD  Medicated and Controlled,(+)  ?  ? substance abuse ? alcohol use,   ?Endo/Other  ?negative endocrine ROS ? Renal/GU ?negative Renal ROS  ?negative genitourinary ?  ?Musculoskeletal ? ?(+) Arthritis , Osteoarthritis,   ? Abdominal ?  ?Peds ?negative pediatric ROS ?(+)  Hematology ? ?(+) Blood dyscrasia, anemia ,   ?Anesthesia Other Findings ? ? Reproductive/Obstetrics ?negative OB ROS ? ?  ? ? ? ? ? ? ? ? ? ? ? ? ? ?  ?  ? ? ? ? ? ? ? ?Anesthesia Physical ?Anesthesia Plan ? ?ASA: 2 ? ?Anesthesia Plan: General  ? ?Post-op Pain Management: Minimal or no pain anticipated  ? ?Induction: Intravenous ? ?PONV Risk Score and Plan: TIVA ? ?Airway Management Planned: Nasal Cannula and Natural Airway ? ?Additional Equipment:  ? ?Intra-op Plan:  ? ?Post-operative Plan:  ? ?Informed Consent: I have reviewed the patients History and Physical, chart, labs and discussed the procedure including the risks, benefits and alternatives for the proposed anesthesia with the patient or authorized representative who has indicated his/her understanding and acceptance.  ? ? ? ?Dental advisory given ? ?Plan Discussed with: CRNA and Surgeon ? ?Anesthesia Plan Comments:   ? ? ? ? ? ? ?Anesthesia Quick Evaluation ? ?

## 2022-01-11 NOTE — Op Note (Signed)
[facility] ?Patient Name: Alex Wilson ?Procedure Date: 01/11/2022 10:39 AM ?MRN: [id number] ?Date of Birth: 08-13-1966 ?Attending MD: Elon Alas. Abbey Chatters , DO ?CSN: [id number] ?Age: 56 ?Admit Type: Outpatient ?Procedure:                Upper GI endoscopy ?Indications:              Abdominal pain in the left upper quadrant, Heartburn ?Providers:                Elon Alas. Abbey Chatters, DO, Becker Page, [place]                          Risa Grill, Technician ?Referring MD:              ?Medicines:                See the Anesthesia note for documentation of the  ?                          administered medications ?Complications:            No immediate complications. ?Estimated Blood Loss:     Estimated blood loss was minimal. ?Procedure:                Pre-Anesthesia Assessment: ?                          - The anesthesia plan was to use monitored  ?                          anesthesia care (MAC). ?                          After obtaining informed consent, the endoscope was  ?                          passed under direct vision. Throughout the  ?                          procedure, the patient's blood pressure, pulse, and  ?                          oxygen saturations were monitored continuously. The  ?                          GIF-H190 ([id number]) scope was introduced through the  ?                          mouth, and advanced to the second part of duodenum.  ?                          The upper GI endoscopy was accomplished without  ?                          difficulty. The patient tolerated the procedure  ?  well. ?Scope In: 10:49:26 AM ?Scope Out: 10:52:06 AM ?Total Procedure Duration: 0 hours 2 minutes 40 seconds  ?Findings: ?     The Z-line was regular and was found 35 cm from the incisors. ?     There is no endoscopic evidence of areas of erosion, esophagitis, hiatal  ?     hernia, ulcerations or varices in the entire esophagus. ?     Localized mild inflammation characterized by  erythema was found in the  ?     gastric antrum. Biopsies were taken with a cold forceps for Helicobacter  ?     pylori testing. ?     The duodenal bulb, first portion of the duodenum and second portion of  ?     the duodenum were normal. ?Impression:               - Z-line regular, 35 cm from the incisors. ?                          - Gastritis. Biopsied. ?                          - Normal duodenal bulb, first portion of the  ?                          duodenum and second portion of the duodenum. ?Moderate Sedation: ?     Per Anesthesia Care ?Recommendation:           - Use Protonix (pantoprazole) 40 mg PO BID. ?                          - No ibuprofen, naproxen, or other non-steroidal  ?                          anti-inflammatory drugs. ?                          - Return to GI clinic in 4 months. ?Procedure Code(s):        --- Professional --- ?                          [phone], Esophagogastroduodenoscopy, flexible,  ?                          transoral; with biopsy, single or multiple ?Diagnosis Code(s):        --- Professional --- ?                          K29.70, Gastritis, unspecified, without bleeding ?                          R10.12, Left upper quadrant pain ?                          R12, Heartburn ?CPT copyright 2019 American Medical Association. All rights reserved. ?The codes documented in this report are preliminary and upon coder review may  ?be revised to meet current compliance requirements. ?Elon Alas. Abbey Chatters, DO ?Elon Alas. [place], DO ?01/11/2022 10:57:31 AM ?This report has  been signed electronically. ?Number of Addenda: 0 ?

## 2022-01-11 NOTE — Interval H&P Note (Signed)
History and Physical Interval Note: ? ?01/11/2022 ?10:23 AM ? ?Alex Wilson  has presented today for surgery, with the diagnosis of GERD, left upper quadrant pain.  The various methods of treatment have been discussed with the patient and family. After consideration of risks, benefits and other options for treatment, the patient has consented to  Procedure(s) with comments: ?ESOPHAGOGASTRODUODENOSCOPY (EGD) WITH PROPOFOL (N/A) - 11:15am as a surgical intervention.  The patient's history has been reviewed, patient examined, no change in status, stable for surgery.  I have reviewed the patient's chart and labs.  Questions were answered to the patient's satisfaction.   ? ? ?[name] ? ? ?

## 2022-01-11 NOTE — Transfer of Care (Signed)
Immediate Anesthesia Transfer of Care Note ? ?Patient: Alex Wilson ? ?Procedure(s) Performed: ESOPHAGOGASTRODUODENOSCOPY (EGD) WITH PROPOFOL ?BIOPSY ? ?Patient Location: Endoscopy Unit ? ?Anesthesia Type:General ? ?Level of Consciousness: awake ? ?Airway & Oxygen Therapy: Patient Spontanous Breathing ? ?Post-op Assessment: Report given to RN and Post -op Vital signs reviewed and stable ? ?Post vital signs: Reviewed and stable ? ?Last Vitals:  ?Vitals Value Taken Time  ?BP    ?Temp    ?Pulse 72   ?Resp 18   ?SpO2 94%   ? ? ?Last Pain:  ?Vitals:  ? 01/11/22 1045  ?TempSrc:   ?PainSc: 0-No pain  ?   ? ?Patients Stated Pain Goal: 10 (01/11/22 1007) ? ?Complications: No notable events documented. ?

## 2022-01-11 NOTE — Discharge Instructions (Addendum)
EGD ?Discharge instructions ?Please read the instructions outlined below and refer to this sheet in the next few weeks. These discharge instructions provide you with general information on caring for yourself after you leave the hospital. Your doctor may also give you specific instructions. While your treatment has been planned according to the most current medical practices available, unavoidable complications occasionally occur. If you have any problems or questions after discharge, please call your doctor. ?ACTIVITY ?You may resume your regular activity but move at a slower pace for the next 24 hours.  ?Take frequent rest periods for the next 24 hours.  ?Walking will help expel (get rid of) the air and reduce the bloated feeling in your abdomen.  ?No driving for 24 hours (because of the anesthesia (medicine) used during the test).  ?You may shower.  ?Do not sign any important legal documents or operate any machinery for 24 hours (because of the anesthesia used during the test).  ?NUTRITION ?Drink plenty of fluids.  ?You may resume your normal diet.  ?Begin with a light meal and progress to your normal diet.  ?Avoid alcoholic beverages for 24 hours or as instructed by your caregiver.  ?MEDICATIONS ?You may resume your normal medications unless your caregiver tells you otherwise.  ?WHAT YOU CAN EXPECT TODAY ?You may experience abdominal discomfort such as a feeling of fullness or ?gas? pains.  ?FOLLOW-UP ?Your doctor will discuss the results of your test with you.  ?SEEK IMMEDIATE MEDICAL ATTENTION IF ANY OF THE FOLLOWING OCCUR: ?Excessive nausea (feeling sick to your stomach) and/or vomiting.  ?Severe abdominal pain and distention (swelling).  ?Trouble swallowing.  ?Temperature over 101? F (37.8? C).  ?Rectal bleeding or vomiting of blood.  ? ? ?Your EGD revealed mild amount inflammation in your stomach.  I took biopsies of this to rule out infection with a bacteria called H. pylori.  Await pathology results, my  office will contact you.  Continue on pantoprazole twice daily.  I did not see any ulcers or stigmata of bleeding.  Follow-up with GI in 3 to 4 months. ? ? ?I hope you have a great rest of your week! ? ?Hennie Duos. Marletta Lor, D.O. ?Gastroenterology and Hepatology ?[facility] ? ?

## 2022-01-14 LAB — SURGICAL PATHOLOGY

## 2022-01-16 ENCOUNTER — Encounter (HOSPITAL_COMMUNITY): Payer: Self-pay | Admitting: Internal Medicine

## 2022-02-12 DIAGNOSIS — Z96612 Presence of left artificial shoulder joint: Secondary | ICD-10-CM | POA: Diagnosis not present
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
## 2022-02-21 ENCOUNTER — Ambulatory Visit: Payer: 59 | Admitting: Gastroenterology

## 2022-02-21 ENCOUNTER — Encounter: Payer: Self-pay | Admitting: Gastroenterology

## 2022-02-21 VITALS — BP 118/64 | HR 64 | Temp 97.9°F | Ht 66.0 in | Wt 190.6 lb

## 2022-02-21 DIAGNOSIS — R1012 Left upper quadrant pain: Secondary | ICD-10-CM | POA: Diagnosis not present

## 2022-02-21 DIAGNOSIS — L290 Pruritus ani: Secondary | ICD-10-CM | POA: Insufficient documentation

## 2022-02-21 DIAGNOSIS — K219 Gastro-esophageal reflux disease without esophagitis: Secondary | ICD-10-CM | POA: Diagnosis not present

## 2022-02-21 DIAGNOSIS — K5900 Constipation, unspecified: Secondary | ICD-10-CM

## 2022-02-21 MED ORDER — PANTOPRAZOLE SODIUM 40 MG PO TBEC: 40.0000 mg | DELAYED_RELEASE_TABLET | Freq: Two times a day (BID) | ORAL | 3 refills | Status: DC

## 2022-02-21 MED ORDER — HYDROCORTISONE (PERIANAL) 2.5 % EX CREA: 1.0000 "application " | TOPICAL_CREAM | Freq: Two times a day (BID) | CUTANEOUS | 1 refills | Status: DC

## 2022-02-21 NOTE — Patient Instructions (Signed)
Start Linzess 72 mcg daily at least 30 minutes before your first meal.  We are providing you with samples today.  Please call with a progress report in 1 week. ? ?You may continue fiber daily. ? ?For reflux: ?Increase Protonix to 40 mg twice daily 30 minutes before breakfast and dinner.  I am sending a new prescription to your pharmacy. ? ?Follow a GERD diet:  ?Avoid fried, fatty, greasy, spicy, citrus foods. ?Avoid caffeine and carbonated beverages. ?Avoid chocolate. ?Try eating 4-6 small meals a day rather than 3 large meals. ?Do not eat within 3 hours of laying down. ?Prop head of bed up on wood or bricks to create a 6 inch incline. ? ?For rectal itching:  ?You may have small hemorrhoids and you have some rectal irritation from frequent wiping. ?Use Anusol rectal cream twice daily for 7 days, then as needed. ?Use wet wipes and Tucks pads when wiping after a bowel movement to help prevent rectal irritation. ?Keep this area clean and dry. ?Limit toilet time to 2-3 minutes. ?Avoid straining. ? ?We will follow-up with you in about 3 months.  Do not hesitate to call if you have any questions or concerns prior to your next visit. ? ?It was nice meeting you today!  ? ?Ermalinda Memos, PA-C ?[facility] ? ?

## 2022-03-05 ENCOUNTER — Telehealth: Payer: Self-pay | Admitting: Gastroenterology

## 2022-03-05 ENCOUNTER — Telehealth: Payer: Self-pay | Admitting: Family Medicine

## 2022-03-05 NOTE — Telephone Encounter (Signed)
PATIENT NEEDS TO SPEAK TO A NURSE ABOUT A PRESCRIPTION  ?

## 2022-03-05 NOTE — Telephone Encounter (Addendum)
LMOM for pt to call office back 

## 2022-03-11 NOTE — Telephone Encounter (Signed)
Pt was made aware and verbalized understanding.  

## 2022-03-11 NOTE — Telephone Encounter (Signed)
Pt states that the Linzess 72 mcg is too strong. Please advise.  ?

## 2022-03-11 NOTE — Telephone Encounter (Signed)
Stop Linzess and try MiraLAX 1 capful (17 g) daily in 8 ounces of water.  Can decrease MiraLAX to one half capful if needed. ?

## 2022-04-18 ENCOUNTER — Telehealth: Payer: Self-pay | Admitting: *Deleted

## 2022-04-18 NOTE — Telephone Encounter (Signed)
Great news.

## 2022-04-18 NOTE — Telephone Encounter (Signed)
This is strange. Please keep close check on the PA and let me know if it is not approved.

## 2022-04-18 NOTE — Telephone Encounter (Signed)
Received approval letter for Pantoprazole.

## 2022-04-18 NOTE — Telephone Encounter (Signed)
Spoke to pt, he informed me that his insurance will only pay for 90 Pantoprazole a year. I have sent a prior authorization

## 2022-04-29 ENCOUNTER — Telehealth: Payer: Self-pay | Admitting: Interventional Cardiology

## 2022-04-29 DIAGNOSIS — Z79899 Other long term (current) drug therapy: Secondary | ICD-10-CM

## 2022-04-29 NOTE — Telephone Encounter (Signed)
Pt c/o Shortness Of Breath: STAT if SOB developed within the last 24 hours or pt is noticeably SOB on the phone  1. Are you currently SOB (can you hear that pt is SOB on the phone)? No  2. How long have you been experiencing SOB? 1 month  3. Are you SOB when sitting or when up moving around? Moving around  4. Are you currently experiencing any other symptoms? Fatigue

## 2022-04-29 NOTE — Telephone Encounter (Signed)
Spoke with patient who reports he has has SOB for the last 2-3 months. He reports he has noticed an increase in SOB over the past month when doing yard work or when [employer] from lower level of home. Patient also reports fatigue with activity for the same amount of time.  Patient denies any CP or swelling, denies any SOB at rest. Patient reports concern given family history of valve disease.  Informed patient that Dr. Katrinka Blazing is out of town until next week, patient states this is fine and he would like to see Dr. Katrinka Blazing for an appt when he returns if Dr. Katrinka Blazing recommends a visit.  Advised patient  to call back if SOB worsens between now and next week. Patient verbalized understanding and expressed appreciation for call.  Will forward to Dr. Katrinka Blazing to review and advise.

## 2022-04-30 ENCOUNTER — Telehealth: Payer: Self-pay | Admitting: Interventional Cardiology

## 2022-04-30 MED ORDER — DAPAGLIFLOZIN PROPANEDIOL 10 MG PO TABS: 10.0000 mg | ORAL_TABLET | Freq: Every day | ORAL | 11 refills | Status: DC

## 2022-04-30 NOTE — Telephone Encounter (Signed)
See previous phone note.  

## 2022-04-30 NOTE — Telephone Encounter (Signed)
Patient wants to speak to Dr. Michaelle Copas nurse. Didn't give further details.

## 2022-04-30 NOTE — Telephone Encounter (Signed)
Left message to call back  

## 2022-04-30 NOTE — Addendum Note (Signed)
Addended by: Franchot Gallo on: 04/30/2022 11:41 AM   Modules accepted: Orders

## 2022-04-30 NOTE — Telephone Encounter (Signed)
Spoke with patient who states he is agreeable to starting [place] 10mg  QD. Samples left up at front desk for patient to pick-up.  OV appt scheduled for 05/31/22 with lab appt for BMET.

## 2022-05-23 DIAGNOSIS — Z96612 Presence of left artificial shoulder joint: Secondary | ICD-10-CM | POA: Diagnosis not present

## 2022-05-23 DIAGNOSIS — G5602 Carpal tunnel syndrome, left upper limb: Secondary | ICD-10-CM | POA: Diagnosis not present
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
## 2022-05-28 ENCOUNTER — Telehealth: Payer: Self-pay | Admitting: Interventional Cardiology

## 2022-05-28 MED ORDER — DAPAGLIFLOZIN PROPANEDIOL 10 MG PO TABS: 10.0000 mg | ORAL_TABLET | Freq: Every day | ORAL | 7 refills | Status: DC

## 2022-05-28 NOTE — Telephone Encounter (Signed)
Pt c/o medication issue:  1. Name of Medication:  dapagliflozin propanediol (FARXIGA) 10 MG TABS tablet  2. How are you currently taking this medication (dosage and times per day)?   3. Are you having a reaction (difficulty breathing--STAT)?   4. What is your medication issue?   Patient states his insurance needs a letter to cover Marcelline Deist and he would like to discuss this with Dr. Michaelle Copas nurse.

## 2022-05-28 NOTE — Telephone Encounter (Signed)
Pt's medication was sent to pt's pharmacy as requested. Confirmation received.  °

## 2022-05-28 NOTE — Telephone Encounter (Signed)
*  STAT* If patient is at the pharmacy, call can be transferred to refill team.   1. Which medications need to be refilled? (please list name of each medication and dose if known)  dapagliflozin propanediol (FARXIGA) 10 MG TABS tablet  2. Which pharmacy/location (including street and city if local pharmacy) is medication to be sent to? [facility] [address], [place] - [address]  3. Do they need a 30 day or 90 day supply?   30 day supply

## 2022-05-29 ENCOUNTER — Inpatient Hospital Stay: Payer: 59 | Admitting: Gastroenterology
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
## 2022-05-29 NOTE — Telephone Encounter (Signed)
**Note De-Identified Taquilla Downum Obfuscation** I did a [place] PA through covermymeds prior to calling the pt back and received this message: MEDICATION PA REQUIREMENT FARXIGA 10MG  TABLETS Not Required  Pt already at the office when I returned his call to request Farxiga samples.  Per the pt he cannot afford $600 for a refill. He states that he was given samples and Co-pay cards when Dr [place] advised him to start taking Farxiga on 6/19 but his pharmacy will not take them. I asked him if he has activated his co-pay card yet and he stated "I didn't know I was suppose to".  Per his request we are leaving him 1 week of Farxiga samples and another co-pay card (in case he needs it) for him to activate and to take with him to his pharmacy when picking up his 7/19. He verbalized understanding and states that he will activate his card today.  He thanked me for our assistance.

## 2022-05-29 NOTE — Telephone Encounter (Signed)
**Note De-Identified Alex Wilson Obfuscation** I started another [place] PA through covermymeds. Key: [id number]  The pt is aware that I started this PA and that I will call him back with the determination once received. He thanked me for calling him back.

## 2022-05-29 NOTE — Telephone Encounter (Signed)
Pt called back stating that he is still out of farxiga medication and wants to speak with Danford Bad, Charity fundraiser. Informed him that the message was sent to her yesterday and she will call back as soon as she can.

## 2022-05-29 NOTE — Telephone Encounter (Signed)
Returned call to patient.  He states he was unable to use co-pay card for [place] at [facility], he states the pharmacy tech told him it needs prior authorization and his insurance requests form stating why patient needs medication.  Spoke with pharmacy tech at [facility] and they state their pharmacy does accept the co-pay card put some insurances may not or may not cover as much as is listed on the co-pay card.  Patient has picked up 1 week of samples and new co-pay card. Marcelline Deist co-pay card states "no activitation required."

## 2022-05-30 ENCOUNTER — Inpatient Hospital Stay: Payer: 59 | Admitting: Gastroenterology
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
## 2022-05-30 NOTE — Telephone Encounter (Signed)
**Note De-Identified [facility] Obfuscation** Dear Arta Bruce: Verlon Au pleased to let you know that we've approved your or your doctor's request for coverage for FARXIGA TAB 10MG . You can now fill your prescription, and it will be covered according to your plan. As long as you remain covered by your prescription drug plan and there are no changes to your plan benefits, this request is approved from 05/29/2022 to 05/30/2023. When this approval expires, please speak to your doctor about your treatment.  Sincerely, [facility] cc: Dr. 06/01/2023   I have notified the pt and [facility] [address], [address] - [address] (Ph: [phone]) of this approval.

## 2022-05-31 ENCOUNTER — Encounter: Payer: Self-pay | Admitting: Interventional Cardiology

## 2022-05-31 ENCOUNTER — Ambulatory Visit: Payer: 59 | Admitting: Interventional Cardiology

## 2022-05-31 ENCOUNTER — Other Ambulatory Visit: Payer: 59

## 2022-05-31 VITALS — BP 122/82 | HR 65 | Ht 66.0 in | Wt 190.6 lb

## 2022-05-31 DIAGNOSIS — R0609 Other forms of dyspnea: Secondary | ICD-10-CM

## 2022-05-31 DIAGNOSIS — E785 Hyperlipidemia, unspecified: Secondary | ICD-10-CM | POA: Diagnosis not present

## 2022-05-31 DIAGNOSIS — I358 Other nonrheumatic aortic valve disorders: Secondary | ICD-10-CM

## 2022-05-31 DIAGNOSIS — I77810 Thoracic aortic ectasia: Secondary | ICD-10-CM | POA: Diagnosis not present

## 2022-05-31 DIAGNOSIS — Z79899 Other long term (current) drug therapy: Secondary | ICD-10-CM

## 2022-05-31 DIAGNOSIS — I2510 Atherosclerotic heart disease of native coronary artery without angina pectoris: Secondary | ICD-10-CM

## 2022-05-31 DIAGNOSIS — I351 Nonrheumatic aortic (valve) insufficiency: Secondary | ICD-10-CM

## 2022-05-31 NOTE — Patient Instructions (Signed)
Medication Instructions:  Your physician recommends that you continue on your current medications as directed. Please refer to the Current Medication list given to you today.  *If you need a refill on your cardiac medications before your next appointment, please call your pharmacy*  Lab Work: TODAY: CBC, BNP, BMET If you have labs (blood work) drawn today and your tests are completely normal, you will receive your results only by: MyChart Message (if you have MyChart) OR A paper copy in the mail If you have any lab test that is abnormal or we need to change your treatment, we will call you to review the results.  Testing/Procedures: Your physician has recommended you have an exercise treadmill test performed.  Follow-Up: At [facility], you and your health needs are our priority.  As part of our continuing mission to provide you with exceptional heart care, we have created designated Provider Care Teams.  These Care Teams include your primary Cardiologist (physician) and Advanced Practice Providers (APPs -  Physician Assistants and Nurse Practitioners) who all work together to provide you with the care you need, when you need it.  Your next appointment:   7 month(s)  The format for your next appointment:   In Person  Provider:   Lesleigh Noe, MD {   Important Information About Sugar

## 2022-06-01 LAB — BASIC METABOLIC PANEL
BUN/Creatinine Ratio: 14 (ref 9–20)
BUN: 12 mg/dL (ref 6–24)
CO2: 21 mmol/L (ref 20–29)
Calcium: 9.4 mg/dL (ref 8.7–10.2)
Chloride: 99 mmol/L (ref 96–106)
Creatinine, Ser: 0.86 mg/dL (ref 0.76–1.27)
Glucose: 89 mg/dL (ref 70–99)
Potassium: 4.1 mmol/L (ref 3.5–5.2)
Sodium: 134 mmol/L (ref 134–144)
eGFR: 103 mL/min/{1.73_m2} (ref >59)
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
## 2022-06-02 IMAGING — CT CT HEART MORP W/ CTA COR W/ SCORE W/ CA W/CM &/OR W/O CM
4 of 7 series · 8 of 20 positions shown, 9 images · IV contrast (APPLIED)
Comparison: None.
COMPARISON: None.

Addendum:
EXAM:
OVER-READ INTERPRETATION  CT CHEST

The following report is an over-read performed by radiologist Dr.
Cherron Macc [[email or web address]] on 10/18/2021. This
over-read does not include interpretation of cardiac or coronary
anatomy or pathology. The coronary calcium score/coronary CTA
interpretation by the cardiologist is attached.
HISTORY: Chest pain, nonspecific
Cardiac/Coronary CT
TECHNIQUE: The patient was scanned on a Siemens Force scanner.
PROTOCOL: A 120 kV prospective scan was triggered in the descending thoracic
aorta at 111 HU's. Axial non-contrast 3 mm slices were carried out
through the heart. The data set was analyzed on a dedicated work
station and scored using the Agatson method. Gantry rotation speed
was 250 msecs and collimation was 0.6 mm. Heart rate optimized
medically, and 0.8 mg of sublingual nitroglycerin was given. The 3D
data set was reconstructed in 5% intervals of 35-75% of the R-R
cycle. Diastolic phases were analyzed on a dedicated work station
using MPR, MIP and VRT modes. The patient received 95mL OMNIPAQUE
IOHEXOL 350 MG/ML SOLN of contrast.

[Series 6: best diast · axial · 0.39mm/px · z∈[+1156,+1199]mm · 2 of 321 slices shown, 3 images]
[im 107/321  vessel]
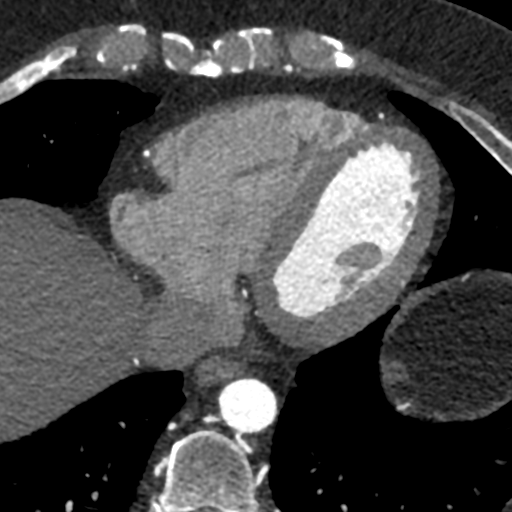
[im 107/321  lung]
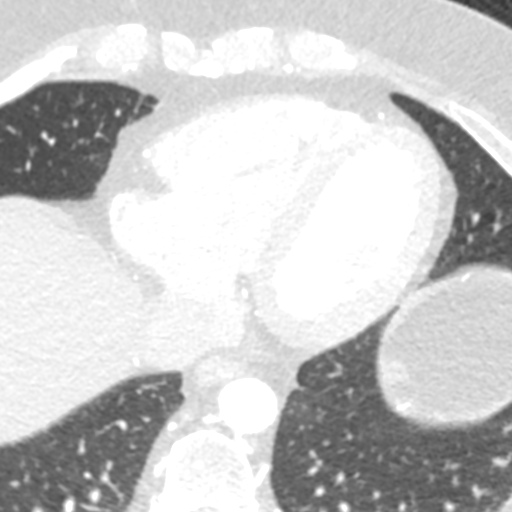
[im 214/321  vessel]
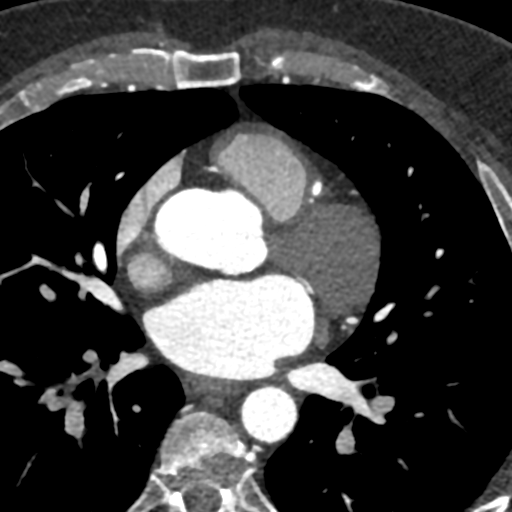

[Series 7: best syst · axial · 0.39mm/px · z∈[+1156,+1199]mm · 2 of 321 slices shown]
[im 107/321  vessel]
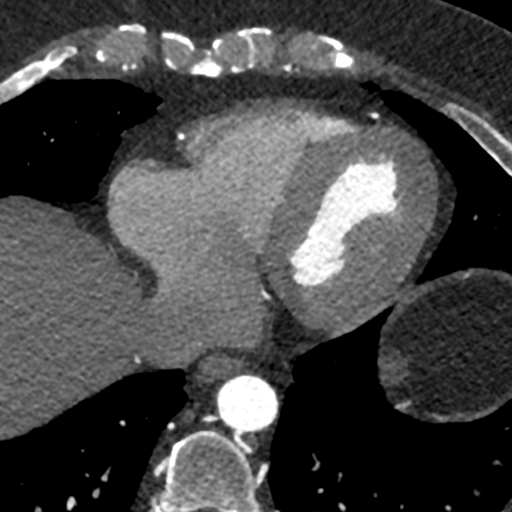
[im 214/321  vessel]
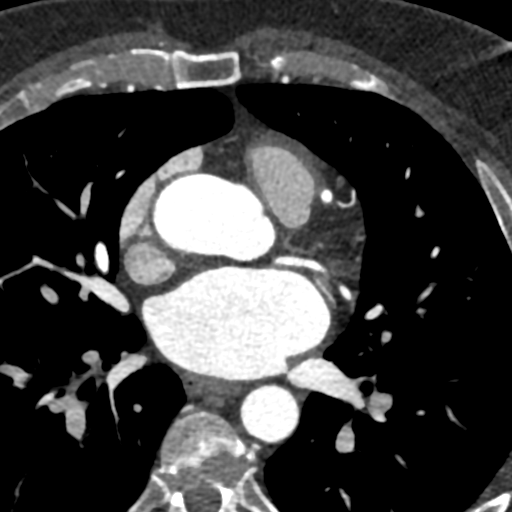

[Series 8: ts diast sharp · axial · 0.39mm/px · z∈[+1156,+1199]mm · 2 of 321 slices shown]
[im 107/321  lung]
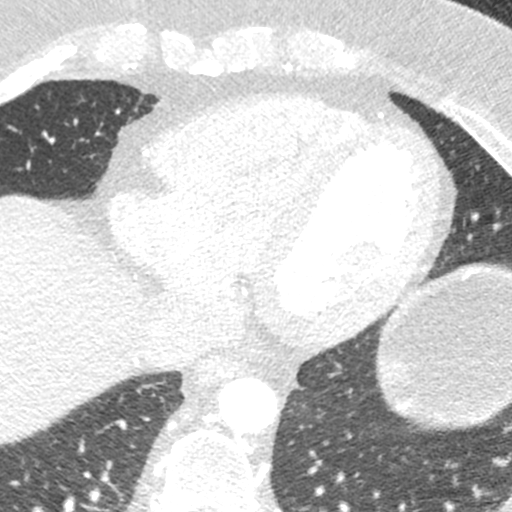
[im 214/321  lung]
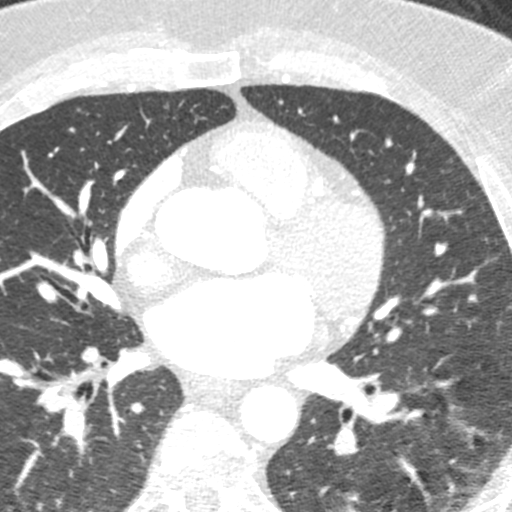

[Series 9: ts syst sharp · axial · 0.39mm/px · z∈[+1156,+1199]mm · 2 of 321 slices shown]
[im 107/321  lung]
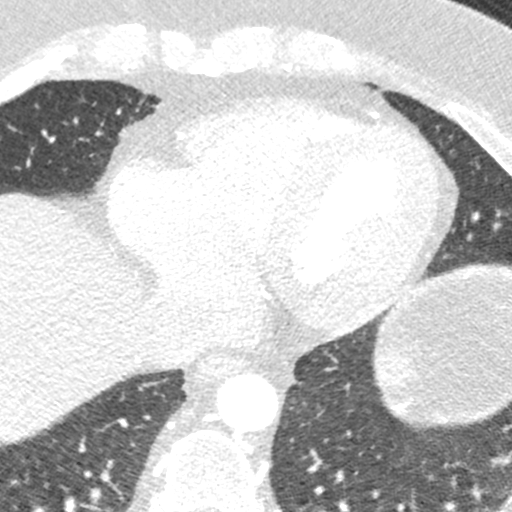
[im 214/321  lung]
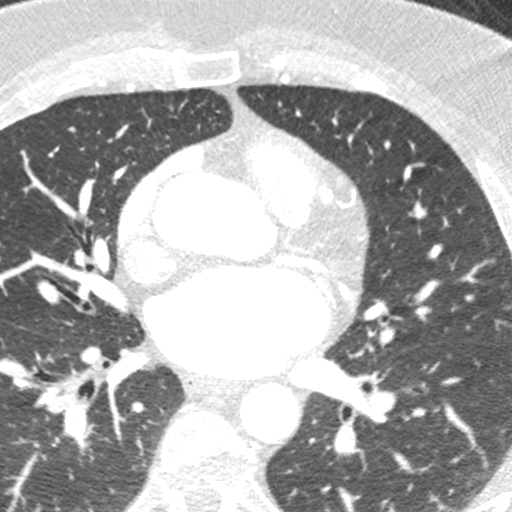

[8 of 20 positions shown; findings below may reference images not displayed]

FINDINGS: Atherosclerotic calcifications in the thoracic aorta. Ectasia of
ascending thoracic aorta (4.0 cm in diameter). Within the visualized
portions of the thorax there are no suspicious appearing pulmonary
nodules or masses, there is no acute consolidative airspace disease,
no pleural effusions, no pneumothorax and no lymphadenopathy.
Visualized portions of the upper abdomen are unremarkable. There are
no aggressive appearing lytic or blastic lesions noted in the
visualized portions of the skeleton.
IMPRESSION: 1.  Aortic Atherosclerosis (6FH6K-CFG.G).
2. Ectasia of ascending thoracic aorta (4.0 cm in diameter).
Recommend annual imaging followup by CTA or MRA. This recommendation
follows 5464 ACCF/AHA/AATS/ACR/ASA/SCA/MARSHAY/GHABBOUR/GISMAT/CHAI Guidelines
for the Diagnosis and Management of Patients with Thoracic Aortic
Disease. Circulation. 5464; 121: E266-e369. Aortic aneurysm NOS
(6FH6K-6AQ.6).
FINDINGS: Coronary calcium score: The patient's coronary artery calcium score
is 34, which places the patient in the 68th percentile.

Coronary arteries: Normal coronary origins.  Right dominance.

Right Coronary Artery: Normal caliber vessel, gives rise to PDA. No
significant plaque or stenosis.

Left Main Coronary Artery: Normal caliber vessel. No significant
plaque or stenosis.

Left Anterior Descending Coronary Artery: Normal caliber vessel.
Mixed calcified and noncalcified plaque in proximal LAD with 1-24%
stenosis. Gives rise to 5 small diagonal branches.

Left Circumflex Artery: Normal caliber vessel. No significant plaque
or stenosis. Gives rise to 1 branching OM branch.

Aorta: Mildly enlarged size, 40 mm at the mid ascending aorta (level
of the PA bifurcation) measured double oblique. No calcifications.
No dissection seen in visualized portions of the aorta.

Aortic Valve: Mild calcifications. Trileaflet.

Other findings:

Normal pulmonary vein drainage into the left atrium.

Normal left atrial appendage without a thrombus.

Normal size of the pulmonary artery.

Normal appearance of the pericardium.
IMPRESSION: 1.  Mild nonobstructive CAD, CADRADS = 1.

2. Coronary calcium score of 34. This was 68th percentile for age
and sex matched control.

3. Normal coronary origin with right dominance.

4.  Mildly enlarged ascending aorta.

INTERPRETATION:

1. CAD-RADS 0: No evidence of CAD (0%). Consider non-atherosclerotic
causes of chest pain.

2. CAD-RADS 1: Minimal non-obstructive CAD (0-24%). Consider
non-atherosclerotic causes of chest pain. Consider preventive
therapy and risk factor modification.

3. CAD-RADS 2: Mild non-obstructive CAD (25-49%). Consider
non-atherosclerotic causes of chest pain. Consider preventive
therapy and risk factor modification.

4. CAD-RADS 3: Moderate stenosis (50-69%). Consider symptom-guided
anti-ischemic pharmacotherapy as well as risk factor modification
per guideline directed care. Additional analysis with CT FFR will be
submitted.

5. CAD-RADS 4: Severe stenosis. (70-99% or > 50% left main). Cardiac
catheterization or CT FFR is recommended. Consider symptom-guided
anti-ischemic pharmacotherapy as well as risk factor modification
per guideline directed care. Invasive coronary angiography
recommended with revascularization per published guideline
statements.

6. CAD-RADS 5: Total coronary occlusion (100%). Consider cardiac
catheterization or viability assessment. Consider symptom-guided
anti-ischemic pharmacotherapy as well as risk factor modification
per guideline directed care.

7. CAD-RADS N: Non-diagnostic study. Obstructive CAD can't be
excluded. Alternative evaluation is recommended.

*** End of Addendum ***
EXAM:
OVER-READ INTERPRETATION  CT CHEST

The following report is an over-read performed by radiologist Dr.
Cherron Macc [REDACTED] on 10/18/2021. This
over-read does not include interpretation of cardiac or coronary
anatomy or pathology. The coronary calcium score/coronary CTA
interpretation by the cardiologist is attached.
FINDINGS: Atherosclerotic calcifications in the thoracic aorta. Ectasia of
ascending thoracic aorta (4.0 cm in diameter). Within the visualized
portions of the thorax there are no suspicious appearing pulmonary
nodules or masses, there is no acute consolidative airspace disease,
no pleural effusions, no pneumothorax and no lymphadenopathy.
Visualized portions of the upper abdomen are unremarkable. There are
no aggressive appearing lytic or blastic lesions noted in the
visualized portions of the skeleton.
IMPRESSION: 1.  Aortic Atherosclerosis (6FH6K-CFG.G).
2. Ectasia of ascending thoracic aorta (4.0 cm in diameter).
Recommend annual imaging followup by CTA or MRA. This recommendation
follows 5464 ACCF/AHA/AATS/ACR/ASA/SCA/MARSHAY/GHABBOUR/GISMAT/CHAI Guidelines
for the Diagnosis and Management of Patients with Thoracic Aortic
Disease. Circulation. 5464; 121: [id number]. Aortic aneurysm NOS
(6FH6K-6AQ.6).

## 2022-06-03 ENCOUNTER — Ambulatory Visit: Payer: 59 | Admitting: Gastroenterology

## 2022-06-03 ENCOUNTER — Other Ambulatory Visit: Payer: Self-pay | Admitting: Gastroenterology

## 2022-06-03 ENCOUNTER — Encounter: Payer: Self-pay | Admitting: Gastroenterology

## 2022-06-03 ENCOUNTER — Encounter: Payer: Self-pay | Admitting: *Deleted

## 2022-06-03 VITALS — BP 129/80 | HR 62 | Temp 97.9°F | Ht 66.0 in | Wt 191.2 lb

## 2022-06-03 DIAGNOSIS — K219 Gastro-esophageal reflux disease without esophagitis: Secondary | ICD-10-CM

## 2022-06-03 DIAGNOSIS — R6881 Early satiety: Secondary | ICD-10-CM

## 2022-06-03 DIAGNOSIS — R140 Abdominal distension (gaseous): Secondary | ICD-10-CM | POA: Insufficient documentation

## 2022-06-03 DIAGNOSIS — R1012 Left upper quadrant pain: Secondary | ICD-10-CM | POA: Diagnosis not present

## 2022-06-03 DIAGNOSIS — K5900 Constipation, unspecified: Secondary | ICD-10-CM | POA: Diagnosis not present

## 2022-06-03 MED ORDER — ESOMEPRAZOLE MAGNESIUM 40 MG PO CPDR: 40.0000 mg | DELAYED_RELEASE_CAPSULE | Freq: Two times a day (BID) | ORAL | 3 refills | Status: DC

## 2022-06-03 MED ORDER — DEXLANSOPRAZOLE 60 MG PO CPDR: 60.0000 mg | DELAYED_RELEASE_CAPSULE | Freq: Every day | ORAL | 3 refills | Status: DC

## 2022-06-03 NOTE — Patient Instructions (Addendum)
We will arrange for you to have a gastric emptying study in the near future.  For reflux and LUQ abdominal pain: Stop Pantoprazole and start Dexilant 60 mg daily.  Stop diclofenac and avoid all NSAID products.  For constipation: Stop MiraLAX. Start Trulance 3 mg daily.  We are providing you with samples today.  Please call with a progress report in 1-2 weeks.  We will plan to see you back in about 3 months.  Do not hesitate to call sooner if you have any questions or concerns.  It was good to see you again today!   Ermalinda Memos, PA-C [facility]

## 2022-06-03 NOTE — Telephone Encounter (Signed)
Please let patient know Dexilant is not covered by insurance.  We will try him on esomeprazole 40 mg twice daily.

## 2022-06-04 ENCOUNTER — Ambulatory Visit: Payer: 59

## 2022-06-04 DIAGNOSIS — R0609 Other forms of dyspnea: Secondary | ICD-10-CM

## 2022-06-04 LAB — EXERCISE TOLERANCE TEST
Angina Index: 0
Duke Treadmill Score: 10
Estimated workload: 10.9
Exercise duration (min): 9 min
Exercise duration (sec): 30 s
MPHR: 166 {beats}/min
Peak HR: 155 {beats}/min
Percent HR: 93 %
RPE: 17
Rest HR: 63 {beats}/min
ST Depression (mm): 0 mm

## 2022-06-05 ENCOUNTER — Encounter (HOSPITAL_COMMUNITY)
Admission: RE | Admit: 2022-06-05 | Discharge: 2022-06-05 | Disposition: A | Discharge status: Home / Self Care | Payer: 59 | Source: Ambulatory Visit | Attending: Gastroenterology | Admitting: Gastroenterology

## 2022-06-05 ENCOUNTER — Encounter (HOSPITAL_COMMUNITY): Payer: Self-pay

## 2022-06-05 DIAGNOSIS — R1012 Left upper quadrant pain: Secondary | ICD-10-CM | POA: Diagnosis not present

## 2022-06-05 DIAGNOSIS — R6881 Early satiety: Secondary | ICD-10-CM | POA: Diagnosis not present

## 2022-06-05 DIAGNOSIS — K219 Gastro-esophageal reflux disease without esophagitis: Secondary | ICD-10-CM | POA: Diagnosis not present

## 2022-06-05 MED ORDER — TECHNETIUM TC 99M SULFUR COLLOID
2.0000 | Freq: Once | INTRAVENOUS | Status: AC | PRN
  Administered 2022-06-05: 2.2 via ORAL

## 2022-06-05 NOTE — Telephone Encounter (Signed)
We have already addressed this. Patient was to start Nexium in place of Dexilant as insurance wouldn't cover Dexilant. Please make sure patient is aware of this.

## 2022-06-07 NOTE — Telephone Encounter (Signed)
Noted. Sent another PA

## 2022-06-10 DIAGNOSIS — G5602 Carpal tunnel syndrome, left upper limb: Secondary | ICD-10-CM | POA: Diagnosis not present

## 2022-06-24 ENCOUNTER — Telehealth: Payer: Self-pay | Admitting: *Deleted

## 2022-06-24 NOTE — Telephone Encounter (Signed)
Pt called in requesting an appt to be seen. Just recently seen. He stated the trulance caused him to have diarrhea. He ran out of samples. He takes benefiber daily. This morning he used miralax bc he has a fullness on left side and can't use the bathroom well. Please advise Baxter Hire thanks

## 2022-06-24 NOTE — Telephone Encounter (Signed)
LMOVM to call back 

## 2022-06-24 NOTE — Telephone Encounter (Signed)
We can try him on Amitiza 8 mcg. We do not have samples of this medication, so I would have to send a Rx to his pharmacy.   If he would like to try overt he counter medications first, he can try increasing MiraLAX to twice daily in 8 oz of water to see if this will allow him to have a BM.

## 2022-06-25 ENCOUNTER — Other Ambulatory Visit: Payer: Self-pay | Admitting: Gastroenterology

## 2022-06-25 DIAGNOSIS — K5900 Constipation, unspecified: Secondary | ICD-10-CM

## 2022-06-25 MED ORDER — TRULANCE 3 MG PO TABS: 3.0000 mg | ORAL_TABLET | Freq: Every day | ORAL | 3 refills | Status: DC

## 2022-06-25 NOTE — Telephone Encounter (Signed)
Pt aware.

## 2022-06-25 NOTE — Telephone Encounter (Signed)
Called pt. Aware of recs by Baxter Hire. He wants a prescription for trulance. He liked it even though it did cause diarrhea. Requesting to be sent to [facility] in [facility]

## 2022-06-25 NOTE — Telephone Encounter (Signed)
I will send in a Rx for him. If it is causing significant diarrhea, recommend we try something different.

## 2022-06-26 ENCOUNTER — Encounter: Payer: Self-pay | Admitting: Family Medicine

## 2022-06-26 ENCOUNTER — Telehealth: Payer: Self-pay | Admitting: Family Medicine

## 2022-06-26 NOTE — Telephone Encounter (Signed)
When patient was told that he was 11 minutes late, the 10 min late policy was explained and he was made aware that we had to ask the provider if they could still see him. He yelled that it was only 11 minutes and he did not understand because he was here now. As he was leaving the building, he was still mumbling, unsure of what he was saying.

## 2022-06-26 NOTE — Telephone Encounter (Signed)
Patient had appointment on 8/16 for CPE with Dr. Nadine Counts. He arrived 11 minutes late and was asked to reschedule by the provider. When he was made aware and offered the first available appointment he left upset and did not reschedule.

## 2022-06-27 ENCOUNTER — Telehealth: Payer: Self-pay | Admitting: *Deleted

## 2022-06-27 ENCOUNTER — Other Ambulatory Visit: Payer: Self-pay | Admitting: Gastroenterology

## 2022-06-27 DIAGNOSIS — K581 Irritable bowel syndrome with constipation: Secondary | ICD-10-CM

## 2022-06-27 MED ORDER — LUBIPROSTONE 8 MCG PO CAPS: 8.0000 ug | ORAL_CAPSULE | Freq: Two times a day (BID) | ORAL | 3 refills | Status: DC

## 2022-06-27 NOTE — Telephone Encounter (Signed)
For his constipation, I will send in a Rx for Amitiza 8 mcg BID. Take with food. Hop[sully his insurance will cover this. Unfortunately, this is out of our control.   I am sorry he is still having LUQ/left sided pain. This is a chronic issue for him and I am not sure it is all GI related. As we discussed at his OV, he could also have musculoskeletal/neuropathic pain contributing as well as his workup so far has been negative including CT, EGD, and gastric emptying study. How is his reflux with Dexilant? Has he stopped diclofenac? This can contribute to inflammation in his stomach as we discussed at his OV.

## 2022-06-27 NOTE — Telephone Encounter (Signed)
Spoke to pt, informed him that his insurance denied Trulance 3 mg. Pt states he doesn't understand why his insurance won't pay for the Trulance. Informed him that he needs to try other medication in his formulary and fail those first before his insurance will pay for Trulance. Pt started to yell and curse at me. I tried to explain what needed to be done first. He then stated do whatever needed to be done to get him some medication.  Amitiza is in his formulary. He then stated what can we do about the pain in his left side under his rib cage. He states it's been hurting for over a year and we have done nothing to help him, but give him pills that don't work. Please advise.

## 2022-07-02 NOTE — Telephone Encounter (Signed)
LMOM for pt to call office  

## 2022-07-03 DIAGNOSIS — G8918 Other acute postprocedural pain: Secondary | ICD-10-CM | POA: Insufficient documentation

## 2022-07-03 DIAGNOSIS — G5602 Carpal tunnel syndrome, left upper limb: Secondary | ICD-10-CM | POA: Diagnosis not present

## 2022-07-03 DIAGNOSIS — M5459 Other low back pain: Secondary | ICD-10-CM | POA: Insufficient documentation

## 2022-07-04 NOTE — Telephone Encounter (Signed)
Noted  

## 2022-07-11 DIAGNOSIS — M5416 Radiculopathy, lumbar region: Secondary | ICD-10-CM | POA: Insufficient documentation

## 2022-07-31 ENCOUNTER — Encounter: Payer: Self-pay | Admitting: *Deleted

## 2022-08-01 DIAGNOSIS — M25562 Pain in left knee: Secondary | ICD-10-CM | POA: Insufficient documentation

## 2022-08-05 ENCOUNTER — Other Ambulatory Visit: Payer: Self-pay | Admitting: Family Medicine

## 2022-08-07 ENCOUNTER — Encounter: Payer: Self-pay | Admitting: Family Medicine

## 2022-08-07 ENCOUNTER — Ambulatory Visit (INDEPENDENT_AMBULATORY_CARE_PROVIDER_SITE_OTHER): Payer: 59 | Admitting: Family Medicine

## 2022-08-07 ENCOUNTER — Other Ambulatory Visit: Payer: Self-pay | Admitting: Family Medicine

## 2022-08-07 VITALS — BP 123/84 | HR 67 | Temp 97.6°F | Ht 66.0 in | Wt 186.6 lb

## 2022-08-07 DIAGNOSIS — Z0000 Encounter for general adult medical examination without abnormal findings: Secondary | ICD-10-CM

## 2022-08-07 DIAGNOSIS — D500 Iron deficiency anemia secondary to blood loss (chronic): Secondary | ICD-10-CM

## 2022-08-07 DIAGNOSIS — E7800 Pure hypercholesterolemia, unspecified: Secondary | ICD-10-CM

## 2022-08-07 DIAGNOSIS — R630 Anorexia: Secondary | ICD-10-CM

## 2022-08-07 DIAGNOSIS — Z0001 Encounter for general adult medical examination with abnormal findings: Secondary | ICD-10-CM

## 2022-08-07 DIAGNOSIS — Z125 Encounter for screening for malignant neoplasm of prostate: Secondary | ICD-10-CM | POA: Diagnosis not present

## 2022-08-07 DIAGNOSIS — R109 Unspecified abdominal pain: Secondary | ICD-10-CM | POA: Diagnosis not present

## 2022-08-07 DIAGNOSIS — R531 Weakness: Secondary | ICD-10-CM

## 2022-08-07 DIAGNOSIS — R634 Abnormal weight loss: Secondary | ICD-10-CM | POA: Diagnosis not present
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
## 2022-08-08 ENCOUNTER — Other Ambulatory Visit: Payer: 59

## 2022-08-08 DIAGNOSIS — E7800 Pure hypercholesterolemia, unspecified: Secondary | ICD-10-CM | POA: Diagnosis not present

## 2022-08-08 DIAGNOSIS — D500 Iron deficiency anemia secondary to blood loss (chronic): Secondary | ICD-10-CM | POA: Diagnosis not present

## 2022-08-08 DIAGNOSIS — Z125 Encounter for screening for malignant neoplasm of prostate: Secondary | ICD-10-CM

## 2022-08-09 ENCOUNTER — Telehealth: Payer: Self-pay | Admitting: Family Medicine

## 2022-08-09 DIAGNOSIS — M25562 Pain in left knee: Secondary | ICD-10-CM | POA: Diagnosis not present

## 2022-08-09 LAB — CBC
Hematocrit: 47.2 % (ref 37.5–51.0)
Hemoglobin: 15.9 g/dL (ref 13.0–17.7)
MCH: 30.3 pg (ref 26.6–33.0)
MCHC: 33.7 g/dL (ref 31.5–35.7)
MCV: 90 fL (ref 79–97)
Platelets: 227 10*3/uL (ref 150–450)
RBC: 5.24 x10E6/uL (ref 4.14–5.80)
RDW: 12.2 % (ref 11.6–15.4)
WBC: 7 10*3/uL (ref 3.4–10.8)

## 2022-08-09 LAB — LIPID PANEL
Chol/HDL Ratio: 2.8 ratio (ref 0.0–5.0)
Cholesterol, Total: 127 mg/dL (ref 100–199)
HDL: 45 mg/dL (ref >39)
LDL Chol Calc (NIH): 61 mg/dL (ref 0–99)
Triglycerides: 114 mg/dL (ref 0–149)
VLDL Cholesterol Cal: 21 mg/dL (ref 5–40)

## 2022-08-09 LAB — CMP14+EGFR
ALT: 41 IU/L (ref 0–44)
AST: 25 IU/L (ref 0–40)
Albumin/Globulin Ratio: 2 (ref 1.2–2.2)
Albumin: 4.6 g/dL (ref 3.8–4.9)
Alkaline Phosphatase: 74 IU/L (ref 44–121)
BUN/Creatinine Ratio: 22 — ABNORMAL HIGH (ref 9–20)
BUN: 21 mg/dL (ref 6–24)
Bilirubin Total: 0.3 mg/dL (ref 0.0–1.2)
CO2: 20 mmol/L (ref 20–29)
Calcium: 9.6 mg/dL (ref 8.7–10.2)
Chloride: 105 mmol/L (ref 96–106)
Creatinine, Ser: 0.95 mg/dL (ref 0.76–1.27)
Globulin, Total: 2.3 g/dL (ref 1.5–4.5)
Glucose: 94 mg/dL (ref 70–99)
Potassium: 4.7 mmol/L (ref 3.5–5.2)
Sodium: 141 mmol/L (ref 134–144)
Total Protein: 6.9 g/dL (ref 6.0–8.5)
eGFR: 95 mL/min/1.73

## 2022-08-09 LAB — TSH: TSH: 1.88 u[IU]/mL (ref 0.450–4.500)

## 2022-08-09 LAB — PSA: Prostate Specific Ag, Serum: 0.4 ng/mL (ref 0.0–4.0)

## 2022-08-09 NOTE — Telephone Encounter (Signed)
Patient said that he needs a new referral sent because the gastro office it was sent to originally does not accept his insurance. He would like to go to [facility] with Velora Heckler, phone number  [phone]  Michela Pitcher he would call their office to make sure they accept his insurance and let us know if they do not.

## 2022-08-09 NOTE — Telephone Encounter (Signed)
Pt called back. He says that [facility] with Velora Heckler does accept his insurance.

## 2022-08-11 ENCOUNTER — Ambulatory Visit (HOSPITAL_BASED_OUTPATIENT_CLINIC_OR_DEPARTMENT_OTHER)
Admission: RE | Admit: 2022-08-11 | Discharge: 2022-08-11 | Disposition: A | Discharge status: Home / Self Care | Payer: 59 | Source: Ambulatory Visit | Attending: Family Medicine | Admitting: Family Medicine

## 2022-08-11 DIAGNOSIS — R4781 Slurred speech: Secondary | ICD-10-CM | POA: Diagnosis not present

## 2022-08-11 DIAGNOSIS — R29898 Other symptoms and signs involving the musculoskeletal system: Secondary | ICD-10-CM | POA: Diagnosis not present

## 2022-08-11 DIAGNOSIS — R531 Weakness: Secondary | ICD-10-CM | POA: Diagnosis not present

## 2022-08-13 ENCOUNTER — Telehealth: Payer: Self-pay | Admitting: Family Medicine

## 2022-08-13 NOTE — Telephone Encounter (Signed)
[facility] in [place] does not except his insurance and wants to go to to [facility] in [place] with Dr. Havery Moros

## 2022-08-14 ENCOUNTER — Other Ambulatory Visit: Payer: Self-pay | Admitting: Family Medicine

## 2022-08-15 ENCOUNTER — Telehealth: Payer: Self-pay | Admitting: Family Medicine

## 2022-08-15 DIAGNOSIS — L819 Disorder of pigmentation, unspecified: Secondary | ICD-10-CM

## 2022-08-15 DIAGNOSIS — M5416 Radiculopathy, lumbar region: Secondary | ICD-10-CM | POA: Diagnosis not present

## 2022-08-15 NOTE — Telephone Encounter (Signed)
Pt calling about this referral. Please call back with an update on status.

## 2022-08-15 NOTE — Telephone Encounter (Signed)
REFERRAL REQUEST Telephone Note  Have you been seen at our office for this problem? Yes but needs new referral due to new insurance (Advise that they may need an appointment with their PCP before a referral can be done)  Reason for Referral: dermatology Referral discussed with patient: needs new referral due to new insurance  Best contact number of patient for referral team: [phone]    Has patient been seen by a specialist for this issue before: yes pt was seeing Dr Nevada Crane  Patient provider preference for referral: [place] office Patient location preference for referral: [facility] office   Patient notified that referrals can take up to a week or longer to process. If they haven't heard anything within a week they should call back and speak with the referral department.

## 2022-08-16 ENCOUNTER — Telehealth: Payer: Self-pay | Admitting: Family Medicine

## 2022-08-16 NOTE — Telephone Encounter (Signed)
done

## 2022-08-16 NOTE — Telephone Encounter (Signed)
I discussed with him during appt, I am not sure he will be accepted because he is technically est at [facility]

## 2022-08-16 NOTE — Telephone Encounter (Signed)
Pt aware he can sign a medical release paper and we can fax labs and charts to wherever he needs it to go

## 2022-08-27 ENCOUNTER — Telehealth: Payer: Self-pay | Admitting: Gastroenterology

## 2022-08-27 NOTE — Telephone Encounter (Signed)
Good Afternoon Dr.Nandigam,  Supervising MD 9/27 PM  We received a referral on this patient to be seen for abdominal pain, weight loss and decreased appetite. He has GI hx at [facility]. He is requesting a transfer for a second opinion on his care, he does not wish to return to [facility].  Patients records are available for review in Epic. Please advise on scheduling.   Thank you.

## 2022-08-28 NOTE — Telephone Encounter (Signed)
The chart has been reviewed. Alex Wilson does not have any new recommendations. We are not able to accept the transfer of care.

## 2022-08-30 DIAGNOSIS — M545 Low back pain, unspecified: Secondary | ICD-10-CM | POA: Diagnosis not present

## 2022-09-06 DIAGNOSIS — M5451 Vertebrogenic low back pain: Secondary | ICD-10-CM | POA: Diagnosis not present

## 2022-09-06 DIAGNOSIS — M4150 Other secondary scoliosis, site unspecified: Secondary | ICD-10-CM | POA: Insufficient documentation

## 2022-09-06 DIAGNOSIS — M4180 Other forms of scoliosis, site unspecified: Secondary | ICD-10-CM | POA: Diagnosis not present

## 2022-09-06 DIAGNOSIS — M5416 Radiculopathy, lumbar region: Secondary | ICD-10-CM | POA: Diagnosis not present

## 2022-09-06 DIAGNOSIS — M5459 Other low back pain: Secondary | ICD-10-CM | POA: Diagnosis not present

## 2022-09-06 NOTE — Telephone Encounter (Signed)
Patient was left VM of decision.

## 2022-09-25 DIAGNOSIS — M25562 Pain in left knee: Secondary | ICD-10-CM | POA: Diagnosis not present

## 2022-09-25 DIAGNOSIS — M1712 Unilateral primary osteoarthritis, left knee: Secondary | ICD-10-CM | POA: Diagnosis not present

## 2022-09-29 ENCOUNTER — Telehealth: Payer: Self-pay | Admitting: Family Medicine

## 2022-09-29 ENCOUNTER — Ambulatory Visit (HOSPITAL_COMMUNITY)
Admission: EM | Admit: 2022-09-29 | Discharge: 2022-09-29 | Disposition: A | Discharge status: Home / Self Care | Payer: 59 | Attending: Family Medicine | Admitting: Family Medicine

## 2022-09-30 NOTE — Telephone Encounter (Signed)
Patient saw information on his MyChart stating he went to the [facility] in [place] on 09/29/22.  Patient states he did not go to any urgent care and is worried his MyChart is hacked.  Advised patient to change password and that I would notify the provider at [facility].

## 2022-10-01 DIAGNOSIS — M5416 Radiculopathy, lumbar region: Secondary | ICD-10-CM | POA: Diagnosis not present

## 2022-10-11 DIAGNOSIS — M1712 Unilateral primary osteoarthritis, left knee: Secondary | ICD-10-CM | POA: Diagnosis not present

## 2022-10-18 ENCOUNTER — Telehealth: Payer: Self-pay | Admitting: Family Medicine

## 2022-10-21 NOTE — Telephone Encounter (Signed)
REFERRAL REQUEST Telephone Note  Have you been seen at our office for this problem? no (Advise that they may need an appointment with their PCP before a referral can be done)  Reason for Referral: Urinating a lot Referral discussed with patient: no  Best contact number of patient for referral team: [phone]    Has patient been seen by a specialist for this issue before: no  Patient provider preference for referral: no Patient location preference for referral: no   Patient notified that referrals can take up to a week or longer to process. If they haven't heard anything within a week they should call back and speak with the referral department.    [facility] related referral please.

## 2022-10-22 DIAGNOSIS — M5416 Radiculopathy, lumbar region: Secondary | ICD-10-CM | POA: Diagnosis not present

## 2022-10-22 DIAGNOSIS — M47896 Other spondylosis, lumbar region: Secondary | ICD-10-CM | POA: Diagnosis not present

## 2022-10-22 DIAGNOSIS — M792 Neuralgia and neuritis, unspecified: Secondary | ICD-10-CM | POA: Diagnosis not present

## 2022-10-23 ENCOUNTER — Telehealth: Payer: Self-pay | Admitting: *Deleted

## 2022-10-23 ENCOUNTER — Telehealth: Payer: Self-pay | Admitting: Interventional Cardiology

## 2022-10-23 DIAGNOSIS — M792 Neuralgia and neuritis, unspecified: Secondary | ICD-10-CM | POA: Insufficient documentation

## 2022-10-23 NOTE — Telephone Encounter (Signed)
NA  Needs to be seen

## 2022-10-23 NOTE — Telephone Encounter (Signed)
Pt would like a callback regarding Medical Clearance. Please advise

## 2022-10-23 NOTE — Telephone Encounter (Signed)
   Name: KOWEN KLUTH  DOB: 12/07/1965  MRN: [id number]  Primary Cardiologist: Lesleigh Noe, MD  Chart reviewed as part of pre-operative protocol coverage. Because of Derik Fults Beaudry's past medical history and time since last visit, he will require a follow-up telephone visit in order to better assess preoperative cardiovascular risk.  Pre-op covering staff: - Please schedule appointment and call patient to inform them. If patient already had an upcoming appointment within acceptable timeframe, please add "pre-op clearance" to the appointment notes so provider is aware. - Please contact requesting surgeon's office via preferred method (i.e, phone, fax) to inform them of need for appointment prior to surgery.  No medications need to be held.  Sharlene Dory, PA-C  10/23/2022, 5:01 PM

## 2022-10-23 NOTE — Telephone Encounter (Signed)
   Pre-operative Risk Assessment    Patient Name: Alex Wilson  DOB: [birth year] MRN: [id number]      Request for Surgical Clearance    Procedure:   LEFT TOTAL KNEE ARTHROPLASTY  Date of Surgery:  Clearance TBD                                 Surgeon:  DR. Samson Frederic Surgeon's Group or Practice Name:  [facility] Phone number:  [phone] ATTN: KERRI MAZE Fax number:  [phone]   Type of Clearance Requested:   - Medical ; NO MEDICATIONS LISTED AS NEEDING TO BE HELD   Type of Anesthesia:  Spinal   Additional requests/questions:    Elpidio Anis   10/23/2022, 4:29 PM

## 2022-10-24 ENCOUNTER — Telehealth: Payer: Self-pay

## 2022-10-24 NOTE — Telephone Encounter (Signed)
Pt scheduled for tele visit on 10/30/22. Med rec and consent done

## 2022-10-24 NOTE — Telephone Encounter (Signed)
1st attempt to reach pt to schedule tele visit. Lvm  

## 2022-10-24 NOTE — Telephone Encounter (Signed)
  Patient Consent for Virtual Visit         Alex Wilson has provided verbal consent on 10/24/2022 for a virtual visit (video or telephone).   CONSENT FOR VIRTUAL VISIT FOR:  Alex Wilson  By participating in this virtual visit I agree to the following:  I hereby voluntarily request, consent and authorize [facility] and its employed or contracted physicians, physician assistants, nurse practitioners or other licensed health care professionals (the Practitioner), to provide me with telemedicine health care services (the "Services") as deemed necessary by the treating Practitioner. I acknowledge and consent to receive the Services by the Practitioner via telemedicine. I understand that the telemedicine visit will involve communicating with the Practitioner through live audiovisual communication technology and the disclosure of certain medical information by electronic transmission. I acknowledge that I have been given the opportunity to request an in-person assessment or other available alternative prior to the telemedicine visit and am voluntarily participating in the telemedicine visit.  I understand that I have the right to withhold or withdraw my consent to the use of telemedicine in the course of my care at any time, without affecting my right to future care or treatment, and that the Practitioner or I may terminate the telemedicine visit at any time. I understand that I have the right to inspect all information obtained and/or recorded in the course of the telemedicine visit and may receive copies of available information for a reasonable fee.  I understand that some of the potential risks of receiving the Services via telemedicine include:  Delay or interruption in medical evaluation due to technological equipment failure or disruption; Information transmitted may not be sufficient (e.g. poor resolution of images) to allow for appropriate medical decision making by the Practitioner;  and/or  In rare instances, security protocols could fail, causing a breach of personal health information.  Furthermore, I acknowledge that it is my responsibility to provide information about my medical history, conditions and care that is complete and accurate to the best of my ability. I acknowledge that Practitioner's advice, recommendations, and/or decision may be based on factors not within their control, such as incomplete or inaccurate data provided by me or distortions of diagnostic images or specimens that may result from electronic transmissions. I understand that the practice of medicine is not an exact science and that Practitioner makes no warranties or guarantees regarding treatment outcomes. I acknowledge that a copy of this consent can be made available to me via my patient portal [facility] MyChart), or I can request a printed copy by calling the office of  [facility].    I understand that my insurance will be billed for this visit.   I have read or had this consent read to me. I understand the contents of this consent, which adequately explains the benefits and risks of the Services being provided via telemedicine.  I have been provided ample opportunity to ask questions regarding this consent and the Services and have had my questions answered to my satisfaction. I give my informed consent for the services to be provided through the use of telemedicine in my medical care

## 2022-10-28 NOTE — Telephone Encounter (Signed)
NA °No call back  °This encounter will be closed  °
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
## 2022-10-30 ENCOUNTER — Ambulatory Visit: Payer: 59 | Attending: Cardiovascular Disease | Admitting: Student

## 2022-10-30 DIAGNOSIS — Z01810 Encounter for preprocedural cardiovascular examination: Secondary | ICD-10-CM

## 2022-11-01 DIAGNOSIS — M5416 Radiculopathy, lumbar region: Secondary | ICD-10-CM | POA: Diagnosis not present

## 2022-11-26 ENCOUNTER — Encounter: Payer: Self-pay | Admitting: Internal Medicine

## 2022-11-26 ENCOUNTER — Ambulatory Visit (INDEPENDENT_AMBULATORY_CARE_PROVIDER_SITE_OTHER): Payer: 59 | Admitting: Internal Medicine

## 2022-11-26 VITALS — BP 125/85 | HR 59 | Temp 97.6°F | Ht 66.0 in | Wt 191.8 lb

## 2022-11-26 DIAGNOSIS — R0789 Other chest pain: Secondary | ICD-10-CM

## 2022-11-26 MED ORDER — PANTOPRAZOLE SODIUM 40 MG PO TBEC: 40.0000 mg | DELAYED_RELEASE_TABLET | Freq: Two times a day (BID) | ORAL | 11 refills | Status: DC

## 2022-11-26 MED ORDER — LIDOCAINE 5 % EX PTCH: MEDICATED_PATCH | CUTANEOUS | 1 refills | Status: DC

## 2022-11-26 NOTE — Patient Instructions (Signed)
It was good to see you again today!  For reflux, increase Protonix to 40 mg twice daily taken best 30 minutes before breakfast and supper, not at bedtime.  New prescription for Protonix 40 mg tablet take 1 twice daily 30 minutes for breakfast and supper (dispense 60) with 11 refills.  GERD information provided  As discussed today based on history and today's physical examination, your left-sided abdominal pain is coming from your rib cage and not your GI tract.  May have a pinched nerve in your back as well.  I would like to get the MRI results (actual report) from [facility] to review  We will try Lidoderm 5% patches.  Place up to 3 patches on the skin overlying the most painful area on your side.  May leave on up to 12 hours daily.  Dispense 30 doses with 1 refill  For bowel function, your bowel function is adequate now without any help.  Just continue with a heart healthy (high-fiber low-fat) diet.  Office visit with me in 4 to 6 weeks  GERD information provided
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
## 2022-11-27 ENCOUNTER — Telehealth: Payer: Self-pay

## 2022-11-27 NOTE — Telephone Encounter (Signed)
PA for Lidocaine 5% patch was denied by insurance due to patient NOT HAVING pain associated with post-herpetic neuralgia, pain associated with diabetic neuropathy or pain associated with cancer-related neuropathy(including cancer treatment-related neuropathy.

## 2022-11-27 NOTE — Telephone Encounter (Signed)
MRI results from [facility] are in the pt's chart under media ready to be viewed. Copy of MRI is also on your desk.

## 2022-11-28 ENCOUNTER — Other Ambulatory Visit: Payer: Self-pay | Admitting: Family Medicine

## 2022-11-30 DIAGNOSIS — R109 Unspecified abdominal pain: Secondary | ICD-10-CM | POA: Diagnosis not present

## 2022-11-30 DIAGNOSIS — K2100 Gastro-esophageal reflux disease with esophagitis, without bleeding: Secondary | ICD-10-CM | POA: Diagnosis not present

## 2022-11-30 DIAGNOSIS — K5792 Diverticulitis of intestine, part unspecified, without perforation or abscess without bleeding: Secondary | ICD-10-CM | POA: Diagnosis not present

## 2022-12-02 NOTE — Telephone Encounter (Signed)
Pt was made aware and verbalized understanding. Pt states that this past Sat he was having problems and went to urgent care and they diagnosed him with diverticulitis and prescribed him antibiotics. Pt states that he is going to see if they help and will let you know at his next ov.

## 2022-12-04 DIAGNOSIS — M5451 Vertebrogenic low back pain: Secondary | ICD-10-CM | POA: Diagnosis not present

## 2022-12-06 ENCOUNTER — Ambulatory Visit (INDEPENDENT_AMBULATORY_CARE_PROVIDER_SITE_OTHER): Payer: 59 | Admitting: Family Medicine

## 2022-12-06 ENCOUNTER — Encounter: Payer: Self-pay | Admitting: Family Medicine

## 2022-12-06 VITALS — BP 117/72 | HR 94 | Temp 98.7°F | Ht 66.0 in | Wt 184.0 lb

## 2022-12-06 DIAGNOSIS — K921 Melena: Secondary | ICD-10-CM | POA: Diagnosis not present

## 2022-12-06 DIAGNOSIS — R634 Abnormal weight loss: Secondary | ICD-10-CM | POA: Diagnosis not present

## 2022-12-06 DIAGNOSIS — R350 Frequency of micturition: Secondary | ICD-10-CM

## 2022-12-06 DIAGNOSIS — K219 Gastro-esophageal reflux disease without esophagitis: Secondary | ICD-10-CM | POA: Diagnosis not present

## 2022-12-06 DIAGNOSIS — R109 Unspecified abdominal pain: Secondary | ICD-10-CM | POA: Diagnosis not present

## 2022-12-06 LAB — URINALYSIS, ROUTINE W REFLEX MICROSCOPIC
Bilirubin, UA: NEGATIVE
Leukocytes,UA: NEGATIVE
Nitrite, UA: NEGATIVE
Protein,UA: NEGATIVE
RBC, UA: NEGATIVE
Specific Gravity, UA: <1.005 — ABNORMAL LOW (ref 1.005–1.030)
Urobilinogen, Ur: 0.2 mg/dL (ref 0.2–1.0)
pH, UA: 6 (ref 5.0–7.5)

## 2022-12-06 LAB — MICROSCOPIC EXAMINATION
Bacteria, UA: NONE SEEN
Epithelial Cells (non renal): NONE SEEN /hpf (ref 0–10)
RBC, Urine: NONE SEEN /hpf (ref 0–2)
Renal Epithel, UA: NONE SEEN /hpf
WBC, UA: NONE SEEN /hpf (ref 0–5)

## 2022-12-06 MED ORDER — SUCRALFATE 1 G PO TABS: 1.0000 g | ORAL_TABLET | Freq: Three times a day (TID) | ORAL | 0 refills | Status: DC

## 2022-12-06 NOTE — Patient Instructions (Signed)
Rectal Bleeding  Rectal bleeding is when blood comes out of the opening of the butt (anus). People with this kind of bleeding may notice bright red blood in their underwear or in the toilet after they poop (have a bowel movement). They may also have blood mixed with their poop (stool), or dark red or black poop. Rectal bleeding is often a sign that something is wrong. This condition can be caused by many things. It needs to be checked by a doctor. Your doctor will do tests to know what is causing your condition. Follow these instructions at home: Watch for any changes in your condition. Take these actions to help with bleeding and discomfort: Medicines Take over-the-counter and prescription medicines only as told by your doctor. Ask your doctor about changing or stopping your normal medicines. This is important if you are taking blood thinners. Medicines that thin the blood can make rectal bleeding worse. Managing constipation Your condition may cause trouble pooping (constipation). To prevent or treat trouble pooping, or to help make your poop soft, you may need to: Drink enough fluid to keep your pee (urine) pale yellow. Take over-the-counter or prescription medicines. Eat foods that are high in fiber. These include beans, whole grains, and fresh fruits and vegetables. Limit foods that are high in fat and sugar. These include fried or sweet foods.  General instructions Try not to strain when you poop. Try taking a warm bath. This may help with pain. Keep all follow-up visits as told by your doctor. This is important. Contact a doctor if: You have pain or swelling in your belly (abdomen). You have a fever. You feel weak. You feel like you may vomit. You cannot poop. Get help right away if: You have new bleeding. You have more bleeding than before. You have black or dark red poop. You vomit blood or something that looks like coffee grounds. You pass out (faint). You have very bad pain  in your butt. Summary Rectal bleeding is when blood comes out of the opening of the butt. This bleeding is often a sign that something is wrong. Eat a diet that is high in fiber. This will help to keep your poop soft. Talk to your doctor if you take medicines that thin the blood. These medicines can make bleeding worse. Get help right away if you have new or more bleeding, black or dark red poop, or blood in your vomit. Also, get help if you pass out or have very bad pain in your butt. This information is not intended to replace advice given to you by your health care provider. Make sure you discuss any questions you have with your health care provider. Document Revised: 09/29/2019 Document Reviewed: 09/29/2019 Elsevier Patient Education  2023 [employer]  
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
## 2022-12-07 LAB — CMP14+EGFR
ALT: 68 IU/L — ABNORMAL HIGH (ref 0–44)
AST: 43 IU/L — ABNORMAL HIGH (ref 0–40)
Albumin/Globulin Ratio: 1.9 (ref 1.2–2.2)
Albumin: 4.7 g/dL (ref 3.8–4.9)
Alkaline Phosphatase: 56 IU/L (ref 44–121)
BUN/Creatinine Ratio: 13 (ref 9–20)
BUN: 13 mg/dL (ref 6–24)
Bilirubin Total: 0.7 mg/dL (ref 0.0–1.2)
CO2: 21 mmol/L (ref 20–29)
Calcium: 9.8 mg/dL (ref 8.7–10.2)
Chloride: 101 mmol/L (ref 96–106)
Creatinine, Ser: 0.97 mg/dL (ref 0.76–1.27)
Globulin, Total: 2.5 g/dL (ref 1.5–4.5)
Glucose: 78 mg/dL (ref 70–99)
Potassium: 4.2 mmol/L (ref 3.5–5.2)
Sodium: 139 mmol/L (ref 134–144)
Total Protein: 7.2 g/dL (ref 6.0–8.5)
eGFR: 92 mL/min/{1.73_m2} (ref >59)

## 2022-12-07 LAB — CBC
Hematocrit: 47.6 % (ref 37.5–51.0)
Hemoglobin: 16.1 g/dL (ref 13.0–17.7)
MCH: 30 pg (ref 26.6–33.0)
MCHC: 33.8 g/dL (ref 31.5–35.7)
MCV: 89 fL (ref 79–97)
Platelets: 233 10*3/uL (ref 150–450)
RBC: 5.37 x10E6/uL (ref 4.14–5.80)
RDW: 12.3 % (ref 11.6–15.4)
WBC: 6.8 10*3/uL (ref 3.4–10.8)

## 2022-12-07 LAB — IRON,TIBC AND FERRITIN PANEL
Ferritin: 357 ng/mL (ref 30–400)
Iron Saturation: 32 % (ref 15–55)
Iron: 105 ug/dL (ref 38–169)
Total Iron Binding Capacity: 329 ug/dL (ref 250–450)
UIBC: 224 ug/dL (ref 111–343)

## 2022-12-09 ENCOUNTER — Other Ambulatory Visit: Payer: Self-pay | Admitting: Family Medicine

## 2022-12-09 ENCOUNTER — Other Ambulatory Visit: Payer: 59

## 2022-12-09 DIAGNOSIS — R748 Abnormal levels of other serum enzymes: Secondary | ICD-10-CM

## 2022-12-09 DIAGNOSIS — R634 Abnormal weight loss: Secondary | ICD-10-CM | POA: Diagnosis not present

## 2022-12-09 DIAGNOSIS — K921 Melena: Secondary | ICD-10-CM

## 2022-12-10 ENCOUNTER — Telehealth: Payer: Self-pay

## 2022-12-10 LAB — FECAL OCCULT BLOOD, IMMUNOCHEMICAL: Fecal Occult Bld: NEGATIVE

## 2022-12-10 NOTE — Telephone Encounter (Signed)
Dr. Lajuana Ripple had placed an urgent referral to gastro and patient has not heard anything.  He is asking if you will check on this and call back in the morning.

## 2022-12-13 ENCOUNTER — Telehealth: Payer: Self-pay | Admitting: Gastroenterology

## 2022-12-13 NOTE — Telephone Encounter (Signed)
Pt does not have a vm. 

## 2022-12-13 NOTE — Telephone Encounter (Signed)
He has appointment with Dr. Gala Romney in coming days. Our appointment may be even further away Please keep appoint with Dr. Gala Romney. If any acute problems, the need to go to urgent care/ED RG

## 2022-12-13 NOTE — Telephone Encounter (Signed)
Dr. Lyndel Safe,  Urgent referral in [place] for melena and unexplained weight loss.  He previously saw [facility] and requesting to switch to [facility] for a 2nd opinion. (Records in [facility]) Please advise urgency and scheduling.  Thanks!  Supervising MD 2/2/24pm

## 2022-12-16 NOTE — Telephone Encounter (Signed)
Patient is calling to check up on status, states he was advised to cancel his appointment in order to be seen here so he did. Still wanting second opinion since he no longer has appointment with [facility]. Please advise

## 2022-12-16 NOTE — Telephone Encounter (Signed)
Patient aware and verbalizes understanding. 

## 2022-12-19 DIAGNOSIS — I2510 Atherosclerotic heart disease of native coronary artery without angina pectoris: Secondary | ICD-10-CM | POA: Insufficient documentation

## 2022-12-19 DIAGNOSIS — I340 Nonrheumatic mitral (valve) insufficiency: Secondary | ICD-10-CM | POA: Insufficient documentation
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
## 2022-12-20 ENCOUNTER — Ambulatory Visit: Payer: 59 | Attending: Cardiology | Admitting: Cardiology

## 2022-12-20 ENCOUNTER — Encounter: Payer: Self-pay | Admitting: Cardiology

## 2022-12-20 VITALS — BP 124/88 | HR 74 | Ht 66.0 in | Wt 185.6 lb

## 2022-12-20 DIAGNOSIS — I340 Nonrheumatic mitral (valve) insufficiency: Secondary | ICD-10-CM | POA: Diagnosis not present

## 2022-12-20 DIAGNOSIS — I2510 Atherosclerotic heart disease of native coronary artery without angina pectoris: Secondary | ICD-10-CM

## 2022-12-20 NOTE — Patient Instructions (Signed)
Medication Instructions:  Your physician recommends that you continue on your current medications as directed. Please refer to the Current Medication list given to you today.  *If you need a refill on your cardiac medications before your next appointment, please call your pharmacy*   Lab Work: None   Testing/Procedures: None   Follow-Up: At [facility], you and your health needs are our priority.  As part of our continuing mission to provide you with exceptional heart care, we have created designated Provider Care Teams.  These Care Teams include your primary Cardiologist (physician) and Advanced Practice Providers (APPs -  Physician Assistants and Nurse Practitioners) who all work together to provide you with the care you need, when you need it.  Your next appointment:   1 year(s)  Provider:   Minus Breeding, MD

## 2022-12-20 NOTE — Progress Notes (Signed)
Sent message, via epic in basket, requesting orders in epic from surgeon.  

## 2022-12-21 ENCOUNTER — Ambulatory Visit: Payer: Self-pay | Admitting: Student

## 2022-12-23 DIAGNOSIS — M25662 Stiffness of left knee, not elsewhere classified: Secondary | ICD-10-CM | POA: Diagnosis not present

## 2022-12-23 DIAGNOSIS — M25562 Pain in left knee: Secondary | ICD-10-CM | POA: Diagnosis not present

## 2022-12-24 NOTE — Patient Instructions (Signed)
DUE TO COVID-19 ONLY TWO VISITORS  (aged 56 and older)  ARE ALLOWED TO COME WITH YOU AND STAY IN THE WAITING ROOM ONLY DURING PRE OP AND PROCEDURE.   **NO VISITORS ARE ALLOWED IN THE SHORT STAY AREA OR RECOVERY ROOM!!**  IF YOU WILL BE ADMITTED INTO THE HOSPITAL YOU ARE ALLOWED ONLY FOUR SUPPORT PEOPLE DURING VISITATION HOURS ONLY (7 AM -8PM)   The support person(s) must pass our screening, gel in and out, and wear a mask at all times, including in the patient's room. Patients must also wear a mask when staff or their support person are in the room. Visitors GUEST BADGE MUST BE WORN VISIBLY  One adult visitor may remain with you overnight and MUST be in the room by 8 P.M.     Your procedure is scheduled on: 01/09/23   Report to [facility] Main Entrance    Report to admitting at : 5:15 AM   Call this number if you have problems the morning of surgery [phone]   Do not eat food :After Midnight.   After Midnight you may have the following liquids until : 4:30 AM DAY OF SURGERY  Water Black Coffee (sugar ok, NO MILK/CREAM OR CREAMERS)  Tea (sugar ok, NO MILK/CREAM OR CREAMERS) regular and decaf                             Plain Jell-O (NO RED)                                           Fruit ices (not with fruit pulp, NO RED)                                     Popsicles (NO RED)                                                                  Juice: apple, WHITE grape, WHITE cranberry Sports drinks like Gatorade (NO RED)   The day of surgery:  Drink ONE (1) Pre-Surgery Clear Ensure or G2 at: 4:30 AM the morning of surgery. Drink in one sitting. Do not sip.  This drink was given to you during your hospital  pre-op appointment visit. Nothing else to drink after completing the  Pre-Surgery Clear Ensure or G2.          If you have questions, please contact your surgeon's office   Oral Hygiene is also important to reduce your risk of infection.                                     Remember - BRUSH YOUR TEETH THE MORNING OF SURGERY WITH YOUR REGULAR TOOTHPASTE  DENTURES WILL BE REMOVED PRIOR TO SURGERY PLEASE DO NOT APPLY "Poly grip" OR ADHESIVES!!!   Do NOT smoke after Midnight   Take these medicines the morning of surgery with A SIP OF WATER: gabapentin,pantoprazole.  DO NOT TAKE ANY ORAL  DIABETIC MEDICATIONS DAY OF YOUR SURGERY  Bring CPAP mask and tubing day of surgery.                              You may not have any metal on your body including hair pins, jewelry, and body piercing             Do not wear lotions, powders, perfumes/cologne, or deodorant              Men may shave face and neck.   Do not bring valuables to the hospital. [place].   Contacts, glasses, or bridgework may not be worn into surgery.   Bring small overnight bag day of surgery.   DO NOT [place]. PHARMACY WILL DISPENSE MEDICATIONS LISTED ON YOUR MEDICATION LIST TO YOU DURING YOUR ADMISSION [place]!    Patients discharged on the day of surgery will not be allowed to drive home.  Someone NEEDS to stay with you for the first 24 hours after anesthesia.   Special Instructions: Bring a copy of your healthcare power of attorney and living will documents         the day of surgery if you haven't scanned them before.              Please read over the following fact sheets you were given: IF YOU HAVE QUESTIONS ABOUT YOUR PRE-OP INSTRUCTIONS PLEASE CALL [phone]    [facility] - Preparing for Surgery Before surgery, you can play an important role.  Because skin is not sterile, your skin needs to be as free of germs as possible.  You can reduce the number of germs on your skin by washing with CHG (chlorahexidine gluconate) soap before surgery.  CHG is an antiseptic cleaner which kills germs and bonds with the skin to continue killing germs even after washing. Please DO NOT use if you  have an allergy to CHG or antibacterial soaps.  If your skin becomes reddened/irritated stop using the CHG and inform your nurse when you arrive at Short Stay. Do not shave (including legs and underarms) for at least 48 hours prior to the first CHG shower.  You may shave your face/neck. Please follow these instructions carefully:  1.  Shower with CHG Soap the night before surgery and the  morning of Surgery.  2.  If you choose to wash your hair, wash your hair first as usual with your  normal  shampoo.  3.  After you shampoo, rinse your hair and body thoroughly to remove the  shampoo.                           4.  Use CHG as you would any other liquid soap.  You can apply chg directly  to the skin and wash                       Gently with a scrungie or clean washcloth.  5.  Apply the CHG Soap to your body ONLY FROM THE NECK DOWN.   Do not use on face/ open  Wound or open sores. Avoid contact with eyes, ears mouth and genitals (private parts).                       Wash face,  Genitals (private parts) with your normal soap.             6.  Wash thoroughly, paying special attention to the area where your surgery  will be performed.  7.  Thoroughly rinse your body with warm water from the neck down.  8.  DO NOT shower/wash with your normal soap after using and rinsing off  the CHG Soap.                9.  Pat yourself dry with a clean towel.            10.  Wear clean pajamas.            11.  Place clean sheets on your bed the night of your first shower and do not  sleep with pets. Day of Surgery : Do not apply any lotions/deodorants the morning of surgery.  Please wear clean clothes to the hospital/surgery center.  FAILURE TO FOLLOW THESE INSTRUCTIONS MAY RESULT IN THE CANCELLATION OF YOUR SURGERY PATIENT SIGNATURE_________________________________  NURSE  SIGNATURE__________________________________  ________________________________________________________________________  Alex Wilson  An incentive spirometer is a tool that can help keep your lungs clear and active. This tool measures how well you are filling your lungs with each breath. Taking long deep breaths may help reverse or decrease the chance of developing breathing (pulmonary) problems (especially infection) following: A long period of time when you are unable to move or be active. BEFORE THE PROCEDURE  If the spirometer includes an indicator to show your best effort, your nurse or respiratory therapist will set it to a desired goal. If possible, sit up straight or lean slightly forward. Try not to slouch. Hold the incentive spirometer in an upright position. INSTRUCTIONS FOR USE  Sit on the edge of your bed if possible, or sit up as far as you can in bed or on a chair. Hold the incentive spirometer in an upright position. Breathe out normally. Place the mouthpiece in your mouth and seal your lips tightly around it. Breathe in slowly and as deeply as possible, raising the piston or the ball toward the top of the column. Hold your breath for 3-5 seconds or for as long as possible. Allow the piston or ball to fall to the bottom of the column. Remove the mouthpiece from your mouth and breathe out normally. Rest for a few seconds and repeat Steps 1 through 7 at least 10 times every 1-2 hours when you are awake. Take your time and take a few normal breaths between deep breaths. The spirometer may include an indicator to show your best effort. Use the indicator as a goal to work toward during each repetition. After each set of 10 deep breaths, practice coughing to be sure your lungs are clear. If you have an incision (the cut made at the time of surgery), support your incision when coughing by placing a pillow or rolled up towels firmly against it. Once you are able to get out of  bed, walk around indoors and cough well. You may stop using the incentive spirometer when instructed by your caregiver.  RISKS AND COMPLICATIONS Take your time so you do not get dizzy or light-headed. If you are in pain, you may need to take or ask  for pain medication before doing incentive spirometry. It is harder to take a deep breath if you are having pain. AFTER USE Rest and breathe slowly and easily. It can be helpful to keep track of a log of your progress. Your caregiver can provide you with a simple table to help with this. If you are using the spirometer at home, follow these instructions: [place] IF:  You are having difficultly using the spirometer. You have trouble using the spirometer as often as instructed. Your pain medication is not giving enough relief while using the spirometer. You develop fever of 100.5 F (38.1 C) or higher. SEEK IMMEDIATE MEDICAL CARE IF:  You cough up bloody sputum that had not been present before. You develop fever of 102 F (38.9 C) or greater. You develop worsening pain at or near the incision site. MAKE SURE YOU:  Understand these instructions. Will watch your condition. Will get help right away if you are not doing well or get worse. Document Released: 03/10/2007 Document Revised: 01/20/2012 Document Reviewed: 05/11/2007 [facility] Patient Information 2014 [place], [place].   ________________________________________________________________________

## 2022-12-26 DIAGNOSIS — R1012 Left upper quadrant pain: Secondary | ICD-10-CM | POA: Diagnosis not present

## 2022-12-26 NOTE — Telephone Encounter (Signed)
Pl schedule with APP clinic or mine, whichever is first available RG

## 2022-12-26 NOTE — Telephone Encounter (Signed)
Patient has appt with [facility] today 2/15

## 2022-12-27 ENCOUNTER — Encounter (HOSPITAL_COMMUNITY)
Admission: RE | Admit: 2022-12-27 | Discharge: 2022-12-27 | Disposition: A | Discharge status: Home / Self Care | Payer: 59 | Source: Ambulatory Visit | Attending: Anesthesiology | Admitting: Anesthesiology

## 2022-12-27 DIAGNOSIS — I2510 Atherosclerotic heart disease of native coronary artery without angina pectoris: Secondary | ICD-10-CM

## 2022-12-27 DIAGNOSIS — Z01818 Encounter for other preprocedural examination: Secondary | ICD-10-CM

## 2022-12-28 DIAGNOSIS — R109 Unspecified abdominal pain: Secondary | ICD-10-CM | POA: Diagnosis not present

## 2022-12-28 DIAGNOSIS — R1012 Left upper quadrant pain: Secondary | ICD-10-CM | POA: Diagnosis not present

## 2022-12-30 ENCOUNTER — Ambulatory Visit (INDEPENDENT_AMBULATORY_CARE_PROVIDER_SITE_OTHER): Payer: 59 | Admitting: Urology

## 2022-12-30 ENCOUNTER — Ambulatory Visit: Payer: Self-pay | Admitting: Student

## 2022-12-30 ENCOUNTER — Encounter: Payer: Self-pay | Admitting: Urology

## 2022-12-30 VITALS — BP 123/83 | HR 73 | Ht 66.0 in | Wt 185.6 lb

## 2022-12-30 DIAGNOSIS — N138 Other obstructive and reflux uropathy: Secondary | ICD-10-CM

## 2022-12-30 DIAGNOSIS — N401 Enlarged prostate with lower urinary tract symptoms: Secondary | ICD-10-CM

## 2022-12-30 DIAGNOSIS — R350 Frequency of micturition: Secondary | ICD-10-CM | POA: Diagnosis not present

## 2022-12-30 LAB — URINALYSIS, ROUTINE W REFLEX MICROSCOPIC
Bilirubin, UA: NEGATIVE
Ketones, UA: NEGATIVE
Leukocytes,UA: NEGATIVE
Nitrite, UA: NEGATIVE
Protein,UA: NEGATIVE
RBC, UA: NEGATIVE
Specific Gravity, UA: 1.01 (ref 1.005–1.030)
Urobilinogen, Ur: 0.2 mg/dL (ref 0.2–1.0)
pH, UA: 7 (ref 5.0–7.5)

## 2022-12-30 LAB — BLADDER SCAN AMB NON-IMAGING: Scan Result: 387

## 2022-12-30 MED ORDER — TAMSULOSIN HCL 0.4 MG PO CAPS: 0.4000 mg | ORAL_CAPSULE | Freq: Every day | ORAL | 11 refills | Status: DC
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
## 2022-12-30 NOTE — Progress Notes (Signed)
post void residual =380m
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
## 2022-12-30 NOTE — Patient Instructions (Signed)
SURGICAL WAITING ROOM VISITATION  Patients having surgery or a procedure may have no more than 2 support people in the waiting area - these visitors may rotate.    Children under the age of 30 must have an adult with them who is not the patient.  Due to an increase in RSV and influenza rates and associated hospitalizations, children ages 23 and under may not visit patients in [place].  If the patient needs to stay at the hospital during part of their recovery, the visitor guidelines for inpatient rooms apply. Pre-op nurse will coordinate an appropriate time for 1 support person to accompany patient in pre-op.  This support person may not rotate.    Please refer to the [facility] website for the visitor guidelines for Inpatients (after your surgery is over and you are in a regular room).       Your procedure is scheduled on:  01/09/23    Report to [facility] Main Entrance    Report to admitting at  [place] AM   Call this number if you have problems the morning of surgery [phone]   Do not eat food :After Midnight.   After Midnight you may have the following liquids until _ 0430_____ AM DAY OF SURGERY  Water Non-Citrus Juices (without pulp, NO RED-Apple, White grape, White cranberry) Black Coffee (NO MILK/CREAM OR CREAMERS, sugar ok)  Clear Tea (NO MILK/CREAM OR CREAMERS, sugar ok) regular and decaf                             Plain Jell-O (NO RED)                                           Fruit ices (not with fruit pulp, NO RED)                                     Popsicles (NO RED)                                                               Sports drinks like Gatorade (NO RED)                     The day of surgery:  Drink ONE (1) Pre-Surgery Clear Ensure or G2 at  0430 AM ( have completed by )  the morning of surgery. Drink in one sitting. Do not sip.  This drink was given to you during your hospital  pre-op appointment visit. Nothing else to  drink after completing the  Pre-Surgery Clear Ensure or G2.          If you have questions, please contact your surgeon's office.     Oral Hygiene is also important to reduce your risk of infection.                                    Remember - BRUSH YOUR TEETH THE MORNING OF SURGERY WITH YOUR  REGULAR TOOTHPASTE  DENTURES WILL BE REMOVED PRIOR TO SURGERY PLEASE DO NOT APPLY "Poly grip" OR ADHESIVES!!!   Do NOT smoke after Midnight   Take these medicines the morning of surgery with A SIP OF WATER:  gabapentin, protonix   DO NOT TAKE ANY ORAL DIABETIC MEDICATIONS DAY OF YOUR SURGERY  Bring CPAP mask and tubing day of surgery.                              You may not have any metal on your body including hair pins, jewelry, and body piercing             Do not wear make-up, lotions, powders, perfumes/cologne, or deodorant  Do not wear nail polish including gel and S&S, artificial/acrylic nails, or any other type of covering on natural nails including finger and toenails. If you have artificial nails, gel coating, etc. that needs to be removed by a nail salon please have this removed prior to surgery or surgery may need to be canceled/ delayed if the surgeon/ anesthesia feels like they are unable to be safely monitored.   Do not shave  48 hours prior to surgery.               Men may shave face and neck.   Do not bring valuables to the hospital. [place].   Contacts, glasses, dentures or bridgework may not be worn into surgery.   Bring small overnight bag day of surgery.   DO NOT [place]. PHARMACY WILL DISPENSE MEDICATIONS LISTED ON YOUR MEDICATION LIST TO YOU DURING YOUR ADMISSION [place]!    Patients discharged on the day of surgery will not be allowed to drive home.  Someone NEEDS to stay with you for the first 24 hours after anesthesia.   Special Instructions: Bring a copy of your  healthcare power of attorney and living will documents the day of surgery if you haven't scanned them before.              Please read over the following fact sheets you were given: IF [place] [phone]   If you received a COVID test during your pre-op visit  it is requested that you wear a mask when out in public, stay away from anyone that may not be feeling well and notify your surgeon if you develop symptoms. If you test positive for Covid or have been in contact with anyone that has tested positive in the last 10 days please notify you surgeon.    [facility] - Preparing for Surgery Before surgery, you can play an important role.  Because skin is not sterile, your skin needs to be as free of germs as possible.  You can reduce the number of germs on your skin by washing with CHG (chlorahexidine gluconate) soap before surgery.  CHG is an antiseptic cleaner which kills germs and bonds with the skin to continue killing germs even after washing. Please DO NOT use if you have an allergy to CHG or antibacterial soaps.  If your skin becomes reddened/irritated stop using the CHG and inform your nurse when you arrive at Short Stay. Do not shave (including legs and underarms) for at least 48 hours prior to the first CHG shower.  You may shave your face/neck. Please follow these instructions carefully:  1.  Shower with CHG Soap the night before surgery and the  morning of Surgery.  2.  If you choose to wash your hair, wash your hair first as usual with your  normal  shampoo.  3.  After you shampoo, rinse your hair and body thoroughly to remove the  shampoo.                           4.  Use CHG as you would any other liquid soap.  You can apply chg directly  to the skin and wash                       Gently with a scrungie or clean washcloth.  5.  Apply the CHG Soap to your body ONLY FROM THE NECK DOWN.   Do not use on face/ open                            Wound or open sores. Avoid contact with eyes, ears mouth and genitals (private parts).                       Wash face,  Genitals (private parts) with your normal soap.             6.  Wash thoroughly, paying special attention to the area where your surgery  will be performed.  7.  Thoroughly rinse your body with warm water from the neck down.  8.  DO NOT shower/wash with your normal soap after using and rinsing off  the CHG Soap.                9.  Pat yourself dry with a clean towel.            10.  Wear clean pajamas.            11.  Place clean sheets on your bed the night of your first shower and do not  sleep with pets. Day of Surgery : Do not apply any lotions/deodorants the morning of surgery.  Please wear clean clothes to the hospital/surgery center.  FAILURE TO FOLLOW THESE INSTRUCTIONS MAY RESULT IN THE CANCELLATION OF YOUR SURGERY PATIENT SIGNATURE_________________________________  NURSE SIGNATURE__________________________________  ________________________________________________________________________

## 2022-12-31 ENCOUNTER — Encounter: Payer: Self-pay | Admitting: Cardiology

## 2022-12-31 ENCOUNTER — Encounter (HOSPITAL_COMMUNITY): Payer: Self-pay

## 2022-12-31 ENCOUNTER — Encounter (HOSPITAL_COMMUNITY)
Admission: RE | Admit: 2022-12-31 | Discharge: 2022-12-31 | Disposition: A | Discharge status: Home / Self Care | Payer: 59 | Source: Ambulatory Visit | Attending: Orthopedic Surgery | Admitting: Orthopedic Surgery

## 2022-12-31 ENCOUNTER — Other Ambulatory Visit: Payer: Self-pay

## 2022-12-31 VITALS — BP 130/85 | HR 68 | Temp 98.1°F | Resp 16 | Ht 64.75 in | Wt 182.0 lb

## 2022-12-31 DIAGNOSIS — I2510 Atherosclerotic heart disease of native coronary artery without angina pectoris: Secondary | ICD-10-CM | POA: Insufficient documentation

## 2022-12-31 DIAGNOSIS — Z01818 Encounter for other preprocedural examination: Secondary | ICD-10-CM | POA: Diagnosis not present

## 2022-12-31 DIAGNOSIS — M1712 Unilateral primary osteoarthritis, left knee: Secondary | ICD-10-CM | POA: Diagnosis not present

## 2022-12-31 LAB — BASIC METABOLIC PANEL
Anion gap: 9 (ref 5–15)
BUN: 14 mg/dL (ref 6–20)
CO2: 24 mmol/L (ref 22–32)
Calcium: 9 mg/dL (ref 8.9–10.3)
Chloride: 107 mmol/L (ref 98–111)
Creatinine, Ser: 0.68 mg/dL (ref 0.61–1.24)
GFR, Estimated: >60 mL/min (ref >60)
Glucose, Bld: 81 mg/dL (ref 70–99)
Potassium: 3.8 mmol/L (ref 3.5–5.1)
Sodium: 140 mmol/L (ref 135–145)

## 2022-12-31 LAB — CBC
HCT: 46.3 % (ref 39.0–52.0)
Hemoglobin: 16.1 g/dL (ref 13.0–17.0)
MCH: 30.6 pg (ref 26.0–34.0)
MCHC: 34.8 g/dL (ref 30.0–36.0)
MCV: 88 fL (ref 80.0–100.0)
Platelets: 218 10*3/uL (ref 150–400)
RBC: 5.26 MIL/uL (ref 4.22–5.81)
RDW: 12.3 % (ref 11.5–15.5)
WBC: 5.8 10*3/uL (ref 4.0–10.5)
nRBC: 0 % (ref 0.0–0.2)

## 2022-12-31 LAB — SURGICAL PCR SCREEN
MRSA, PCR: NEGATIVE
Staphylococcus aureus: NEGATIVE

## 2023-01-01 ENCOUNTER — Encounter (HOSPITAL_COMMUNITY): Payer: Self-pay | Admitting: Physician Assistant

## 2023-01-01 ENCOUNTER — Encounter: Payer: Self-pay | Admitting: Family Medicine

## 2023-01-01 ENCOUNTER — Encounter (HOSPITAL_COMMUNITY): Payer: Self-pay | Admitting: Certified Registered Nurse Anesthetist

## 2023-01-01 ENCOUNTER — Telehealth: Payer: Self-pay | Admitting: Family Medicine

## 2023-01-01 DIAGNOSIS — M545 Low back pain, unspecified: Secondary | ICD-10-CM

## 2023-01-01 DIAGNOSIS — K551 Chronic vascular disorders of intestine: Secondary | ICD-10-CM

## 2023-01-01 NOTE — Telephone Encounter (Signed)
done 

## 2023-01-01 NOTE — Telephone Encounter (Signed)
REFERRAL REQUEST Telephone Note  Have you been seen at our office for this problem? yes (Advise that they may need an appointment with their PCP before a referral can be done)  Reason for Referral: spine & back issues Referral discussed with patient: yes Best contact number of patient for referral team: [phone]    Has patient been seen by a specialist for this issue before: yes  Patient provider preference for referral: [facility] Patient location preference for referral: [place] # [phone]   Patient notified that referrals can take up to a week or longer to process. If they haven't heard anything within a week they should call back and speak with the referral department.
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
## 2023-01-01 NOTE — Anesthesia Preprocedure Evaluation (Deleted)
Anesthesia Evaluation    Airway        Dental   Pulmonary           Cardiovascular      Neuro/Psych    GI/Hepatic   Endo/Other    Renal/GU      Musculoskeletal   Abdominal   Peds  Hematology   Anesthesia Other Findings   Reproductive/Obstetrics                             Anesthesia Physical Anesthesia Plan  ASA:   Anesthesia Plan:    Post-op Pain Management:    Induction:   PONV Risk Score and Plan:   Airway Management Planned:   Additional Equipment:   Intra-op Plan:   Post-operative Plan:   Informed Consent:   Plan Discussed with:   Anesthesia Plan Comments: (See PAT note 12/31/2022)       Anesthesia Quick Evaluation

## 2023-01-02 ENCOUNTER — Telehealth: Payer: Self-pay | Admitting: Family Medicine

## 2023-01-03 ENCOUNTER — Telehealth: Payer: Self-pay | Admitting: Family Medicine

## 2023-01-03 NOTE — Telephone Encounter (Signed)
Pt called to let Dr Lajuana Ripple know that he had a CT done on Saturday at [facility] in [place] and they are supposed to be mailing her the results. Also says the results are in Power share if she has access to look at it that way.  Pt also has an endoscopy scheduled and his results for that will get sent as well.

## 2023-01-06 ENCOUNTER — Other Ambulatory Visit: Payer: 59

## 2023-01-06 DIAGNOSIS — M5412 Radiculopathy, cervical region: Secondary | ICD-10-CM | POA: Diagnosis not present

## 2023-01-06 DIAGNOSIS — R748 Abnormal levels of other serum enzymes: Secondary | ICD-10-CM

## 2023-01-06 DIAGNOSIS — M48062 Spinal stenosis, lumbar region with neurogenic claudication: Secondary | ICD-10-CM | POA: Diagnosis not present

## 2023-01-06 DIAGNOSIS — Z6830 Body mass index (BMI) 30.0-30.9, adult: Secondary | ICD-10-CM | POA: Diagnosis not present

## 2023-01-06 LAB — CMP14+EGFR
ALT: 38 IU/L (ref 0–44)
AST: 26 IU/L (ref 0–40)
Albumin/Globulin Ratio: 2.3 — ABNORMAL HIGH (ref 1.2–2.2)
Albumin: 5 g/dL — ABNORMAL HIGH (ref 3.8–4.9)
Alkaline Phosphatase: 66 IU/L (ref 44–121)
BUN/Creatinine Ratio: 19 (ref 9–20)
BUN: 16 mg/dL (ref 6–24)
Bilirubin Total: 0.6 mg/dL (ref 0.0–1.2)
CO2: 22 mmol/L (ref 20–29)
Calcium: 10.1 mg/dL (ref 8.7–10.2)
Chloride: 99 mmol/L (ref 96–106)
Creatinine, Ser: 0.85 mg/dL (ref 0.76–1.27)
Globulin, Total: 2.2 g/dL (ref 1.5–4.5)
Glucose: 92 mg/dL (ref 70–99)
Potassium: 4.7 mmol/L (ref 3.5–5.2)
Sodium: 137 mmol/L (ref 134–144)
Total Protein: 7.2 g/dL (ref 6.0–8.5)
eGFR: 103 mL/min/{1.73_m2} (ref >59)

## 2023-01-07 ENCOUNTER — Ambulatory Visit: Payer: 59 | Admitting: Internal Medicine

## 2023-01-13 ENCOUNTER — Encounter: Payer: Self-pay | Admitting: *Deleted

## 2023-01-13 ENCOUNTER — Other Ambulatory Visit: Payer: Self-pay | Admitting: *Deleted

## 2023-01-13 ENCOUNTER — Ambulatory Visit (INDEPENDENT_AMBULATORY_CARE_PROVIDER_SITE_OTHER): Payer: 59 | Admitting: Surgery

## 2023-01-13 ENCOUNTER — Other Ambulatory Visit: Payer: Self-pay

## 2023-01-13 ENCOUNTER — Encounter: Payer: Self-pay | Admitting: Surgery

## 2023-01-13 VITALS — BP 139/88 | HR 67 | Temp 98.3°F | Resp 20 | Ht 64.75 in | Wt 187.0 lb

## 2023-01-13 DIAGNOSIS — K551 Chronic vascular disorders of intestine: Secondary | ICD-10-CM

## 2023-01-13 MED ORDER — ROSUVASTATIN CALCIUM 20 MG PO TABS: 20.0000 mg | ORAL_TABLET | Freq: Every day | ORAL | 3 refills | Status: DC
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
## 2023-01-14 DIAGNOSIS — R1012 Left upper quadrant pain: Secondary | ICD-10-CM | POA: Diagnosis not present

## 2023-01-21 ENCOUNTER — Encounter (HOSPITAL_COMMUNITY): Payer: Self-pay | Admitting: Surgery

## 2023-01-21 ENCOUNTER — Ambulatory Visit (HOSPITAL_COMMUNITY)
Admission: RE | Admit: 2023-01-21 | Discharge: 2023-01-21 | Disposition: A | Discharge status: Home / Self Care | Payer: 59 | Attending: Surgery | Admitting: Surgery

## 2023-01-21 ENCOUNTER — Ambulatory Visit (HOSPITAL_COMMUNITY)
Admission: RE | Disposition: A | Discharge status: Home / Self Care | Payer: Self-pay | Source: Home / Self Care | Attending: Surgery

## 2023-01-21 ENCOUNTER — Other Ambulatory Visit: Payer: Self-pay

## 2023-01-21 DIAGNOSIS — Z79899 Other long term (current) drug therapy: Secondary | ICD-10-CM | POA: Diagnosis not present

## 2023-01-21 DIAGNOSIS — E7800 Pure hypercholesterolemia, unspecified: Secondary | ICD-10-CM | POA: Insufficient documentation

## 2023-01-21 DIAGNOSIS — K551 Chronic vascular disorders of intestine: Secondary | ICD-10-CM

## 2023-01-21 HISTORY — PX: VISCERAL ANGIOGRAPHY: CATH118276

## 2023-01-21 LAB — POCT I-STAT, CHEM 8
BUN: 20 mg/dL (ref 6–20)
Calcium, Ion: 1.17 mmol/L (ref 1.15–1.40)
Chloride: 104 mmol/L (ref 98–111)
Creatinine, Ser: 1 mg/dL (ref 0.61–1.24)
Glucose, Bld: 97 mg/dL (ref 70–99)
HCT: 41 % (ref 39.0–52.0)
Hemoglobin: 13.9 g/dL (ref 13.0–17.0)
Potassium: 4 mmol/L (ref 3.5–5.1)
Sodium: 141 mmol/L (ref 135–145)
TCO2: 24 mmol/L (ref 22–32)

## 2023-01-21 SURGERY — VISCERAL ANGIOGRAPHY
Anesthesia: LOCAL

## 2023-01-21 MED ORDER — ONDANSETRON HCL 4 MG/2ML IJ SOLN: 4.0000 mg | Freq: Four times a day (QID) | INTRAMUSCULAR | Status: DC | PRN

## 2023-01-21 MED ORDER — FENTANYL CITRATE (PF) 100 MCG/2ML IJ SOLN
INTRAMUSCULAR | Status: AC
  Filled 2023-01-21: qty 2

## 2023-01-21 MED ORDER — LABETALOL HCL 5 MG/ML IV SOLN: 10.0000 mg | INTRAVENOUS | Status: DC | PRN

## 2023-01-21 MED ORDER — LIDOCAINE HCL (PF) 1 % IJ SOLN
INTRAMUSCULAR | Status: AC
  Filled 2023-01-21: qty 30

## 2023-01-21 MED ORDER — FENTANYL CITRATE (PF) 100 MCG/2ML IJ SOLN
INTRAMUSCULAR | Status: DC | PRN
  Administered 2023-01-21: 25 ug via INTRAVENOUS
  Administered 2023-01-21: 50 ug via INTRAVENOUS

## 2023-01-21 MED ORDER — SODIUM CHLORIDE 0.9 % IV SOLN: 250.0000 mL | INTRAVENOUS | Status: DC | PRN

## 2023-01-21 MED ORDER — SODIUM CHLORIDE 0.9 % IV SOLN: INTRAVENOUS | Status: DC

## 2023-01-21 MED ORDER — LIDOCAINE HCL (PF) 1 % IJ SOLN
INTRAMUSCULAR | Status: DC | PRN
  Administered 2023-01-21: 10 mL via INTRADERMAL

## 2023-01-21 MED ORDER — MIDAZOLAM HCL 5 MG/5ML IJ SOLN
INTRAMUSCULAR | Status: AC
  Filled 2023-01-21: qty 5

## 2023-01-21 MED ORDER — ACETAMINOPHEN 325 MG PO TABS: 650.0000 mg | ORAL_TABLET | ORAL | Status: DC | PRN

## 2023-01-21 MED ORDER — SODIUM CHLORIDE 0.9% FLUSH: 3.0000 mL | Freq: Two times a day (BID) | INTRAVENOUS | Status: DC

## 2023-01-21 MED ORDER — SODIUM CHLORIDE 0.9 % WEIGHT BASED INFUSION: 1.0000 mL/kg/h | INTRAVENOUS | Status: DC

## 2023-01-21 MED ORDER — HEPARIN (PORCINE) IN NACL 1000-0.9 UT/500ML-% IV SOLN
INTRAVENOUS | Status: DC | PRN
  Administered 2023-01-21 (×2): 500 mL

## 2023-01-21 MED ORDER — IODIXANOL 320 MG/ML IV SOLN
INTRAVENOUS | Status: DC | PRN
  Administered 2023-01-21: 120 mL

## 2023-01-21 MED ORDER — MIDAZOLAM HCL 2 MG/2ML IJ SOLN
INTRAMUSCULAR | Status: DC | PRN
  Administered 2023-01-21: 2 mg via INTRAVENOUS
  Administered 2023-01-21: 1 mg via INTRAVENOUS

## 2023-01-21 MED ORDER — SODIUM CHLORIDE 0.9% FLUSH: 3.0000 mL | INTRAVENOUS | Status: DC | PRN

## 2023-01-21 MED ORDER — HYDRALAZINE HCL 20 MG/ML IJ SOLN: 5.0000 mg | INTRAMUSCULAR | Status: DC | PRN

## 2023-01-21 SURGICAL SUPPLY — 13 items
CATH ANGIO 5F BER2 100CM (CATHETERS) IMPLANT
CATH OMNI FLUSH 5F 65CM (CATHETERS) IMPLANT
DEVICE VASC CLSR CELT ART 5 (Vascular Products) IMPLANT
KIT MICROPUNCTURE NIT STIFF (SHEATH) IMPLANT
KIT PV (KITS) ×1 IMPLANT
SHEATH PINNACLE 5F 10CM (SHEATH) IMPLANT
SHEATH PROBE COVER 6X72 (BAG) IMPLANT
STOPCOCK MORSE 400PSI 3WAY (MISCELLANEOUS) IMPLANT
SYR MEDRAD MARK 7 150ML (SYRINGE) ×1 IMPLANT
TRANSDUCER W/STOPCOCK (MISCELLANEOUS) ×1 IMPLANT
TRAY PV CATH (CUSTOM PROCEDURE TRAY) ×1 IMPLANT
TUBING CIL FLEX 10 FLL-RA (TUBING) IMPLANT
WIRE HITORQ VERSACORE ST 145CM (WIRE) IMPLANT

## 2023-01-21 NOTE — Op Note (Signed)
    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  Ultrasound-guided access, right femoral artery  2.  Abdominal aortogram  3.  First order catheterization (superior mesenteric artery)  4.  Superior mesenteric artery angiogram  5.  Conscious sedation, 35 minutes  6.  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  The patient was then placed supine on the table and prepped and draped in the usual sterile fashion.  A time out was called.  Conscious sedation was administered with the use of IV fentanyl and Versed under continuous physician and nurse monitoring.  Heart rate, blood pressure, and oxygen saturation were continuously monitored.  Total sedation time was 38 minutes.  Ultrasound was used to evaluate the right common femoral artery.  It was patent .  A digital ultrasound image was acquired.  A micropuncture needle was used to access the right common femoral artery under ultrasound guidance.  An 018 wire was advanced without resistance and a micropuncture sheath was placed.  The 018 wire was removed and a benson wire was placed.  The micropuncture sheath was exchanged for a 5 french sheath.  An omniflush catheter was advanced over the wire to the level of T-12.  An abdominal angiogram was obtained in the AP and lateral projections.  Next, a Berenstein 2 catheter was used to select the superior mesenteric artery and selective supra mesenteric artery angiogram was performed with pullback pressures as well.  Findings:   Aortogram: No significant renal artery stenosis was identified.  The infrarenal abdominal aorta is widely patent.  Bilateral common  and external iliac arteries are widely patent.  The celiac artery is widely patent.  The inferior mesenteric artery is small in caliber but patent without stenosis.  No renal artery stenosis was visualized there is approximately a 50% stenosis within the superior mesenteric artery proximally.  This was associated with a 25 mm pressure gradient.    Intervention: The groin was closed with a Celt  Impression:  #1  No significant aortic stenosis was identified  #2  Widely patent celiac artery  #3  Small but patent without stenosis, inferior mesenteric artery  #4  Super mesenteric artery had approximately a 45-50% stenosis proximally.  This was associated with a 25 mm gradient  #5  Based off the above findings, I would not expect his moderate SMA stenosis to be causing him such significant issues, especially given the fact that his celiac and inferior mesenteric artery are widely patent.  He will continue to have an extensive GI workup.  If all of that remains unremarkable, he could undergo stenting of the superior mesenteric artery, but I would only do this as a last resort   V. Annamarie Major, M.D., [facility] [place] Office: [phone] Pager:  [phone]

## 2023-01-21 NOTE — Interval H&P Note (Signed)
History and Physical Interval Note:  01/21/2023 7:05 AM  Alex Wilson  has presented today for surgery, with the diagnosis of mesenteric artery stenosis.  The various methods of treatment have been discussed with the patient and family. After consideration of risks, benefits and other options for treatment, the patient has consented to  Procedure(s): ABDOMINAL AORTOGRAM W/LOWER EXTREMITY (N/A) as a surgical intervention.  The patient's history has been reviewed, patient examined, no change in status, stable for surgery.  I have reviewed the patient's chart and labs.  Questions were answered to the patient's satisfaction.     Annamarie Major

## 2023-01-21 NOTE — Progress Notes (Signed)
Patient is ready for discharge. Dc instruction was provided to patient and his friend. Procedural site looks intact with no signs of hematoma and bleeding. Patient  ambulated , IV out and voided.

## 2023-01-21 NOTE — Discharge Instructions (Signed)
Femoral Site Care This sheet gives you information about how to care for yourself after your procedure. Your health care provider may also give you more specific instructions. If you have problems or questions, contact your health care provider. What can I expect after the procedure?  After the procedure, it is common to have: Bruising that usually fades within 1-2 weeks. Tenderness at the site. Follow these instructions at home: Wound care Follow instructions from your health care provider about how to take care of your insertion site. Make sure you: Wash your hands with soap and water before you change your bandage (dressing). If soap and water are not available, use hand sanitizer. Remove your dressing as told by your health care provider. In 24 hours Do not take baths, swim, or use a hot tub until your health care provider approves. You may shower 24-48 hours after the procedure or as told by your health care provider. Gently wash the site with plain soap and water. Pat the area dry with a clean towel. Do not rub the site. This may cause bleeding. Do not apply powder or lotion to the site. Keep the site clean and dry. Check your femoral site every day for signs of infection. Check for: Redness, swelling, or pain. Fluid or blood. Warmth. Pus or a bad smell. Activity For the first 2-3 days after your procedure, or as long as directed: Avoid climbing stairs as much as possible. Do not squat. Do not lift anything that is heavier than 10 lb (4.5 kg), or the limit that you are told, until your health care provider says that it is safe. For 5 days Rest as directed. Avoid sitting for a long time without moving. Get up to take short walks every 1-2 hours. Do not drive for 24 hours if you were given a medicine to help you relax (sedative). General instructions Take over-the-counter and prescription medicines only as told by your health care provider. Keep all follow-up visits as told by  your health care provider. This is important. Contact a health care provider if you have: A fever or chills. You have redness, swelling, or pain around your insertion site. Get help right away if: The catheter insertion area swells very fast. You pass out. You suddenly start to sweat or your skin gets clammy. The catheter insertion area is bleeding, and the bleeding does not stop when you hold steady pressure on the area. The area near or just beyond the catheter insertion site becomes pale, cool, tingly, or numb. These symptoms may represent a serious problem that is an emergency. Do not wait to see if the symptoms will go away. Get medical help right away. Call your local emergency services (911 in the [place] Do not drive yourself to the hospital. Summary After the procedure, it is common to have bruising that usually fades within 1-2 weeks. Check your femoral site every day for signs of infection. Do not lift anything that is heavier than 10 lb (4.5 kg), or the limit that you are told, until your health care provider says that it is safe. This information is not intended to replace advice given to you by your health care provider. Make sure you discuss any questions you have with your health care provider. Document Revised: 11/10/2017 Document Reviewed: 11/10/2017 Elsevier Patient Education  2020 Elsevier Inc. 

## 2023-01-22 MED FILL — Midazolam HCl Inj 5 MG/5ML (Base Equivalent): INTRAMUSCULAR | Qty: 3 | Status: AC

## 2023-01-23 DIAGNOSIS — M5126 Other intervertebral disc displacement, lumbar region: Secondary | ICD-10-CM | POA: Diagnosis not present

## 2023-01-23 DIAGNOSIS — M48062 Spinal stenosis, lumbar region with neurogenic claudication: Secondary | ICD-10-CM | POA: Diagnosis not present

## 2023-01-23 DIAGNOSIS — M48061 Spinal stenosis, lumbar region without neurogenic claudication: Secondary | ICD-10-CM | POA: Diagnosis not present

## 2023-01-24 ENCOUNTER — Telehealth: Payer: Self-pay | Admitting: Surgery

## 2023-01-24 NOTE — Telephone Encounter (Signed)
-----   Message from Serafina Mitchell, MD sent at 01/21/2023  8:37 AM EDT ----- 01/21/2023:  Surgeon:  Annamarie Major Procedure Performed:  1.  Ultrasound-guided access, right femoral artery  2.  Abdominal aortogram  3.  First order catheterization (superior mesenteric artery)  4.  Superior mesenteric artery angiogram  5.  Conscious sedation, 35 minutes  6.  Closure device, Celt     Follow-up with me in 3 months, no studies needed

## 2023-02-03 ENCOUNTER — Encounter (HOSPITAL_COMMUNITY): Payer: Self-pay | Admitting: Surgery

## 2023-02-08 DIAGNOSIS — Z803 Family history of malignant neoplasm of breast: Secondary | ICD-10-CM | POA: Diagnosis not present

## 2023-02-08 DIAGNOSIS — Z6830 Body mass index (BMI) 30.0-30.9, adult: Secondary | ICD-10-CM | POA: Diagnosis not present

## 2023-02-08 DIAGNOSIS — Z833 Family history of diabetes mellitus: Secondary | ICD-10-CM | POA: Diagnosis not present

## 2023-02-08 DIAGNOSIS — E785 Hyperlipidemia, unspecified: Secondary | ICD-10-CM | POA: Diagnosis not present

## 2023-02-08 DIAGNOSIS — N400 Enlarged prostate without lower urinary tract symptoms: Secondary | ICD-10-CM | POA: Diagnosis not present

## 2023-02-08 DIAGNOSIS — R030 Elevated blood-pressure reading, without diagnosis of hypertension: Secondary | ICD-10-CM | POA: Diagnosis not present

## 2023-02-08 DIAGNOSIS — E669 Obesity, unspecified: Secondary | ICD-10-CM | POA: Diagnosis not present

## 2023-02-08 DIAGNOSIS — M4800 Spinal stenosis, site unspecified: Secondary | ICD-10-CM | POA: Diagnosis not present

## 2023-02-08 DIAGNOSIS — Z7984 Long term (current) use of oral hypoglycemic drugs: Secondary | ICD-10-CM | POA: Diagnosis not present

## 2023-02-08 DIAGNOSIS — K219 Gastro-esophageal reflux disease without esophagitis: Secondary | ICD-10-CM | POA: Diagnosis not present

## 2023-02-08 DIAGNOSIS — G629 Polyneuropathy, unspecified: Secondary | ICD-10-CM | POA: Diagnosis not present

## 2023-02-08 DIAGNOSIS — Z823 Family history of stroke: Secondary | ICD-10-CM | POA: Diagnosis not present

## 2023-02-10 DIAGNOSIS — M5412 Radiculopathy, cervical region: Secondary | ICD-10-CM | POA: Diagnosis not present

## 2023-02-10 DIAGNOSIS — M5136 Other intervertebral disc degeneration, lumbar region: Secondary | ICD-10-CM | POA: Diagnosis not present

## 2023-02-19 ENCOUNTER — Ambulatory Visit: Payer: Self-pay | Admitting: Student

## 2023-02-21 ENCOUNTER — Encounter (HOSPITAL_COMMUNITY): Payer: Self-pay

## 2023-02-21 NOTE — Progress Notes (Addendum)
COVID Vaccine Completed:  Date of COVID positive in last 90 days:  PCP - Delynn Flavin, DO Cardiologist - Rollene Rotunda, MD  Chest x-ray -  EKG - 12-20-22 Epic Stress Test  - 06-04-22 Epic ECHO - 10-26-21 Epic Cardiac Cath -  Pacemaker/ICD device last checked: Spinal Cord Stimulator: Coronary CT - 10-18-21 Epic  Cardiac clearance in note dated 12-20-22 by Dr. Antoine Poche  Bowel Prep -   Sleep Study -  CPAP -   Fasting Blood Sugar -  Checks Blood Sugar _____ times a day  Last dose of GLP1 agonist-  N/A GLP1 instructions:  N/A   Marcelline Deist Last dose of SGLT-2 inhibitors-  N/A SGLT-2 instructions: Hold 3 days before surgery (do not take after 03-02-23)   Blood Thinner Instructions: Aspirin Instructions: Last Dose:  Activity level:  Can go up a flight of stairs and perform activities of daily living without stopping and without symptoms of chest pain or shortness of breath.  Able to exercise without symptoms  Unable to go up a flight of stairs without symptoms of     Anesthesia review: CAD, murmur, mild aortic stenosis, hx of mesenteric artery stenosis with aortagram 01-21-23  Patient denies shortness of breath, fever, cough and chest pain at PAT appointment  Patient verbalized understanding of instructions that were given to them at the PAT appointment. Patient was also instructed that they will need to review over the PAT instructions again at home before surgery.

## 2023-02-21 NOTE — Patient Instructions (Addendum)
SURGICAL WAITING ROOM VISITATION Patients having surgery or a procedure may have no more than 2 support people in the waiting area - these visitors may rotate.    Children under the age of 51 must have an adult with them who is not the patient.  If the patient needs to stay at the hospital during part of their recovery, the visitor guidelines for inpatient rooms apply. Pre-op nurse will coordinate an appropriate time for 1 support person to accompany patient in pre-op.  This support person may not rotate.    Please refer to the [facility] website for the visitor guidelines for Inpatients (after your surgery is over and you are in a regular room).       Your procedure is scheduled on: 03-06-23   Report to [facility] Main Entrance    Report to admitting at 5:15 AM   Call this number if you have problems the morning of surgery [phone]   Do not eat food :After Midnight.   After Midnight you may have the following liquids until 4:30 AM DAY OF SURGERY  Water Non-Citrus Juices (without pulp, NO RED) Carbonated Beverages Black Coffee (NO MILK/CREAM OR CREAMERS, sugar ok)  Clear Tea (NO MILK/CREAM OR CREAMERS, sugar ok) regular and decaf                             Plain Jell-O (NO RED)                                           Fruit ices (not with fruit pulp, NO RED)                                     Popsicles (NO RED)                                                               Sports drinks like Gatorade (NO RED)                   The day of surgery:  Drink ONE (1) Pre-Surgery Clear Ensure at 4:30 AM the morning of surgery. Drink in one sitting. Do not sip.  This drink was given to you during your hospital  pre-op appointment visit. Nothing else to drink after completing the Pre-Surgery Clear Ensure .          If you have questions, please contact your surgeon's office.   FOLLOW  ANY ADDITIONAL PRE OP INSTRUCTIONS YOU RECEIVED FROM YOUR SURGEON'S OFFICE!!!      Oral Hygiene is also important to reduce your risk of infection.                                    Remember - BRUSH YOUR TEETH THE MORNING OF SURGERY WITH YOUR REGULAR TOOTHPASTE   Do NOT smoke after Midnight   Take these medicines the morning of surgery with A SIP OF WATER:   Doxepin  Gabapentin  Pantoprazole  Rosuvastatin  Hold Farxiga 3 days before surgery (do not take after 03-02-23)  Bring CPAP mask and tubing day of surgery.                              You may not have any metal on your body including  jewelry, and body piercing             Do not wear  lotions, powders, cologne, or deodorant              Men may shave face and neck.   Do not bring valuables to the hospital. [facility] IS NOT RESPONSIBLE   FOR VALUABLES.   Contacts, dentures or bridgework may not be worn into surgery.  DO NOT BRING YOUR HOME MEDICATIONS TO THE HOSPITAL. PHARMACY WILL DISPENSE MEDICATIONS LISTED ON YOUR MEDICATION LIST TO YOU DURING YOUR ADMISSION IN THE HOSPITAL!    Patients discharged on the day of surgery will not be allowed to drive home.  Someone NEEDS to stay with you for the first 24 hours after anesthesia.   Special Instructions: Bring a copy of your healthcare power of attorney and living will documents the day of surgery if you haven't scanned them before.              Please read over the following fact sheets you were given: IF YOU HAVE QUESTIONS ABOUT YOUR PRE-OP INSTRUCTIONS PLEASE CALL [phone] [place]  If you received a COVID test during your pre-op visit  it is requested that you wear a mask when out in public, stay away from anyone that may not be feeling well and notify your surgeon if you develop symptoms. If you test positive for Covid or have been in contact with anyone that has tested positive in the last 10 days please notify you surgeon.    Incentive Spirometer  An incentive spirometer is a tool that can help keep your lungs clear and active. This tool  measures how well you are filling your lungs with each breath. Taking long deep breaths may help reverse or decrease the chance of developing breathing (pulmonary) problems (especially infection) following: A long period of time when you are unable to move or be active. BEFORE THE PROCEDURE  If the spirometer includes an indicator to show your best effort, your nurse or respiratory therapist will set it to a desired goal. If possible, sit up straight or lean slightly forward. Try not to slouch. Hold the incentive spirometer in an upright position. INSTRUCTIONS FOR USE  Sit on the edge of your bed if possible, or sit up as far as you can in bed or on a chair. Hold the incentive spirometer in an upright position. Breathe out normally. Place the mouthpiece in your mouth and seal your lips tightly around it. Breathe in slowly and as deeply as possible, raising the piston or the ball toward the top of the column. Hold your breath for 3-5 seconds or for as long as possible. Allow the piston or ball to fall to the bottom of the column. Remove the mouthpiece from your mouth and breathe out normally. Rest for a few seconds and repeat Steps 1 through 7 at least 10 times every 1-2 hours when you are awake. Take your time and take a few normal breaths between deep breaths. The spirometer may include an indicator to show your best effort. Use the indicator as a goal to work toward  during each repetition. After each set of 10 deep breaths, practice coughing to be sure your lungs are clear. If you have an incision (the cut made at the time of surgery), support your incision when coughing by placing a pillow or rolled up towels firmly against it. Once you are able to get out of bed, walk around indoors and cough well. You may stop using the incentive spirometer when instructed by your caregiver.  RISKS AND COMPLICATIONS Take your time so you do not get dizzy or light-headed. If you are in pain, you may need to  take or ask for pain medication before doing incentive spirometry. It is harder to take a deep breath if you are having pain. AFTER USE Rest and breathe slowly and easily. It can be helpful to keep track of a log of your progress. Your caregiver can provide you with a simple table to help with this. If you are using the spirometer at home, follow these instructions: SEEK MEDICAL CARE IF:  You are having difficultly using the spirometer. You have trouble using the spirometer as often as instructed. Your pain medication is not giving enough relief while using the spirometer. You develop fever of 100.5 F (38.1 C) or higher. SEEK IMMEDIATE MEDICAL CARE IF:  You cough up bloody sputum that had not been present before. You develop fever of 102 F (38.9 C) or greater. You develop worsening pain at or near the incision site. MAKE SURE YOU:  Understand these instructions. Will watch your condition. Will get help right away if you are not doing well or get worse. Document Released: 03/10/2007 Document Revised: 01/20/2012 Document Reviewed: 05/11/2007 [facility] Patient Information 2014 [place], [place].   ________________________________________________________________________

## 2023-02-24 ENCOUNTER — Encounter (HOSPITAL_COMMUNITY): Payer: Self-pay

## 2023-02-24 ENCOUNTER — Encounter (HOSPITAL_COMMUNITY)
Admission: RE | Admit: 2023-02-24 | Discharge: 2023-02-24 | Disposition: A | Discharge status: Home / Self Care | Payer: 59 | Source: Ambulatory Visit | Attending: Orthopedic Surgery | Admitting: Orthopedic Surgery

## 2023-02-24 ENCOUNTER — Encounter: Payer: Self-pay | Admitting: Urology

## 2023-02-24 ENCOUNTER — Encounter: Payer: Self-pay | Admitting: Surgery

## 2023-02-24 ENCOUNTER — Ambulatory Visit (INDEPENDENT_AMBULATORY_CARE_PROVIDER_SITE_OTHER): Payer: 59 | Admitting: Urology

## 2023-02-24 ENCOUNTER — Other Ambulatory Visit: Payer: Self-pay

## 2023-02-24 ENCOUNTER — Encounter: Payer: Self-pay | Admitting: Family Medicine

## 2023-02-24 VITALS — BP 133/93 | HR 78 | Temp 98.6°F | Resp 20 | Ht 66.0 in | Wt 183.2 lb

## 2023-02-24 VITALS — BP 127/84 | HR 67

## 2023-02-24 DIAGNOSIS — Z01812 Encounter for preprocedural laboratory examination: Secondary | ICD-10-CM | POA: Diagnosis present

## 2023-02-24 DIAGNOSIS — N138 Other obstructive and reflux uropathy: Secondary | ICD-10-CM | POA: Diagnosis not present

## 2023-02-24 DIAGNOSIS — R350 Frequency of micturition: Secondary | ICD-10-CM

## 2023-02-24 DIAGNOSIS — N401 Enlarged prostate with lower urinary tract symptoms: Secondary | ICD-10-CM

## 2023-02-24 DIAGNOSIS — Z01818 Encounter for other preprocedural examination: Secondary | ICD-10-CM

## 2023-02-24 DIAGNOSIS — I2510 Atherosclerotic heart disease of native coronary artery without angina pectoris: Secondary | ICD-10-CM | POA: Diagnosis not present

## 2023-02-24 HISTORY — DX: Atherosclerotic heart disease of native coronary artery without angina pectoris: I25.10

## 2023-02-24 LAB — BASIC METABOLIC PANEL
Anion gap: 9 (ref 5–15)
BUN: 13 mg/dL (ref 6–20)
CO2: 23 mmol/L (ref 22–32)
Calcium: 9.3 mg/dL (ref 8.9–10.3)
Chloride: 108 mmol/L (ref 98–111)
Creatinine, Ser: 0.89 mg/dL (ref 0.61–1.24)
GFR, Estimated: >60 mL/min (ref >60)
Glucose, Bld: 85 mg/dL (ref 70–99)
Potassium: 3.9 mmol/L (ref 3.5–5.1)
Sodium: 140 mmol/L (ref 135–145)

## 2023-02-24 LAB — URINALYSIS, ROUTINE W REFLEX MICROSCOPIC
Bilirubin, UA: NEGATIVE
Ketones, UA: NEGATIVE
Leukocytes,UA: NEGATIVE
Nitrite, UA: NEGATIVE
RBC, UA: NEGATIVE
Specific Gravity, UA: 1.02 (ref 1.005–1.030)
Urobilinogen, Ur: 0.2 mg/dL (ref 0.2–1.0)
pH, UA: 6.5 (ref 5.0–7.5)

## 2023-02-24 LAB — SURGICAL PCR SCREEN
MRSA, PCR: NEGATIVE
Staphylococcus aureus: NEGATIVE

## 2023-02-24 LAB — CBC
HCT: 46.1 % (ref 39.0–52.0)
Hemoglobin: 16.2 g/dL (ref 13.0–17.0)
MCH: 30.6 pg (ref 26.0–34.0)
MCHC: 35.1 g/dL (ref 30.0–36.0)
MCV: 87.1 fL (ref 80.0–100.0)
Platelets: 238 10*3/uL (ref 150–400)
RBC: 5.29 MIL/uL (ref 4.22–5.81)
RDW: 12.2 % (ref 11.5–15.5)
WBC: 5.8 10*3/uL (ref 4.0–10.5)
nRBC: 0 % (ref 0.0–0.2)

## 2023-02-24 LAB — MICROSCOPIC EXAMINATION: Bacteria, UA: NONE SEEN

## 2023-02-24 LAB — BLADDER SCAN AMB NON-IMAGING: Scan Result: 0
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
## 2023-02-24 NOTE — Progress Notes (Signed)
Pt here today for bladder scan. Bladder was scanned and 0 was visualized.    Performed by Tyrell Seifer, CMA  

## 2023-02-25 ENCOUNTER — Telehealth: Payer: Self-pay | Admitting: Family Medicine

## 2023-02-25 ENCOUNTER — Ambulatory Visit: Payer: 59 | Admitting: Physical Therapy

## 2023-02-27 NOTE — Progress Notes (Signed)
Fax received from [facility] on 02/24/23 for medical clearance/medication hold for left total hip arthroplasty to be signed by Dr. Myra Gianotti.  Provider signed on 02/24/23, form faxed back to sender on 02/24/23, verified successful, sent to scan center.

## 2023-03-04 ENCOUNTER — Ambulatory Visit: Payer: Self-pay | Admitting: Student
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
## 2023-03-06 ENCOUNTER — Ambulatory Visit (HOSPITAL_COMMUNITY): Admission: RE | Admit: 2023-03-06 | Payer: 59 | Source: Home / Self Care | Admitting: Orthopedic Surgery

## 2023-03-06 ENCOUNTER — Encounter (HOSPITAL_COMMUNITY): Admission: RE | Payer: Self-pay | Source: Home / Self Care

## 2023-03-06 SURGERY — ARTHROPLASTY, KNEE, TOTAL, USING IMAGELESS COMPUTER-ASSISTED NAVIGATION
Anesthesia: Spinal | Site: Knee | Laterality: Left

## 2023-03-10 ENCOUNTER — Telehealth: Payer: Self-pay

## 2023-03-10 ENCOUNTER — Telehealth: Payer: Self-pay | Admitting: Family Medicine

## 2023-03-10 ENCOUNTER — Ambulatory Visit: Payer: 59 | Admitting: Physical Therapy

## 2023-03-10 NOTE — Telephone Encounter (Signed)
   Patient Name: Alex Wilson  DOB: 10-May-1966 MRN: [id number]  Primary Cardiologist: Rollene Rotunda, MD  Chart reviewed as part of pre-operative protocol coverage. Given past medical history and time since last visit, based on ACC/AHA guidelines, BINNIE VONDERHAAR is at acceptable risk for the planned procedure without further cardiovascular testing.   I will route this recommendation to the requesting party via Epic fax function and remove from pre-op pool.  Please call with questions.  Napoleon Form, Leodis Rains, NP 03/10/2023, 1:18 PM

## 2023-03-10 NOTE — Telephone Encounter (Signed)
Received surgery clearance form for patient via fax. No provider has any availability on their regular scheduled days until 5/10. Ok to schedule patient for surgery clearance with Dr Nadine Counts on her DOD day on 5/6?   (Clearance form is sitting in medical records box)

## 2023-03-10 NOTE — Telephone Encounter (Signed)
   Pre-operative Risk Assessment    Patient Name: Alex Wilson  DOB: [birth year] MRN: [id number]      Request for Surgical Clearance    Procedure:   Left Total Knee Arthroplasty  Date of Surgery:  Clearance TBD                                 Surgeon:  Dr. Samson Frederic Surgeon's Group or Practice Name:  [facility] Phone number:  [phone] Fax number:  [phone]   Type of Clearance Requested:   - Medical    Type of Anesthesia:  Spinal   Additional requests/questions:    Signed, Zada Finders   03/10/2023, 1:11 PM

## 2023-03-17 ENCOUNTER — Encounter: Payer: Self-pay | Admitting: Family Medicine

## 2023-03-17 ENCOUNTER — Ambulatory Visit (INDEPENDENT_AMBULATORY_CARE_PROVIDER_SITE_OTHER): Payer: 59 | Admitting: Family Medicine

## 2023-03-17 VITALS — BP 121/77 | HR 62 | Temp 98.8°F | Ht 66.0 in | Wt 188.0 lb

## 2023-03-17 DIAGNOSIS — Z01818 Encounter for other preprocedural examination: Secondary | ICD-10-CM

## 2023-03-17 DIAGNOSIS — I2510 Atherosclerotic heart disease of native coronary artery without angina pectoris: Secondary | ICD-10-CM | POA: Diagnosis not present

## 2023-03-17 LAB — URINALYSIS
Bilirubin, UA: NEGATIVE
Ketones, UA: NEGATIVE
Leukocytes,UA: NEGATIVE
Nitrite, UA: NEGATIVE
Protein,UA: NEGATIVE
RBC, UA: NEGATIVE
Specific Gravity, UA: 1.01 (ref 1.005–1.030)
Urobilinogen, Ur: 0.2 mg/dL (ref 0.2–1.0)
pH, UA: 7 (ref 5.0–7.5)

## 2023-03-17 LAB — COAGUCHEK XS/INR WAIVED
INR: 1 (ref 0.9–1.1)
Prothrombin Time: 11.8 s

## 2023-03-17 NOTE — Progress Notes (Signed)
Pt is a 57 y.o. male who is here for preoperative clearance for left knee surgery with Dr Veda Canning. No history of difficulty with sedation/ post op nausea or vomiting or difficulty healing post op.  1) High Risk Cardiac Conditions  1) Recent MI - No.  2) Decompensated Heart Failure - No.  3) Unstable angina - No.  4) Symptomatic arrythmia - No.  5) Sx Valvular Disease - No.  2) Intermediate Risk Factors - DM, CKD, CVA, CHF, CAD - Yes.    2) Functional Status - > 4 mets (Walk, run, climb stairs) Yes.     3) Surgery Specific Risk -  Intermediate (Orthopedic)  4) Further Noninvasive evaluation -   1) EKG - No.   1) Hx of CVA, CAD, DM, CKD  2) Echo - No.   1) Worsening dyspnea   3) Stress Testing - Active Cardiac Disease - No.  5) Need for medical therapy - Beta Blocker, Statins indicated ? No.  PE: Vitals:   03/17/23 1449  BP: 121/77  Pulse: 62  Temp: 98.8 F (37.1 C)  SpO2: 98%   Physical Examination: General appearance - alert, well appearing, and in no distress Mental status - alert, oriented to person, place, and time Eyes - pupils equal and reactive, extraocular eye movements intact Mouth - mucous membranes moist, pharynx normal without lesions and mallampati 1 Neck - supple, no significant adenopathy Chest - clear to auscultation, no wheezes, rales or rhonchi, symmetric air entry Heart - regular rate and rhythm with blowing heart murmur present. +2 radial pulses  Preop examination - Plan: CMP14+EGFR, CBC, CoaguChek XS/INR Waived, Urinalysis  Coronary artery disease involving native coronary artery of native heart without angina pectoris  I have independently evaluated patient.  Alex Wilson is a 57 y.o. male who is low risk for an intermediate risk surgery.  There are not modifiable risk factors (smoking, etc). Alex Wilson's RCRI/NSQIP calculation for MACE is: 0.   He has had cardiac clearance by Dr Michaelle Copas office already.  Advised against use of NSAID meds prior  to surgery.    Shaelyn Decarli M. Nadine Counts, DO [facility]

## 2023-03-18 LAB — CMP14+EGFR
ALT: 34 IU/L (ref 0–44)
AST: 28 IU/L (ref 0–40)
Albumin/Globulin Ratio: 1.9 (ref 1.2–2.2)
Albumin: 4.6 g/dL (ref 3.8–4.9)
Alkaline Phosphatase: 74 IU/L (ref 44–121)
BUN/Creatinine Ratio: 12 (ref 9–20)
BUN: 11 mg/dL (ref 6–24)
Bilirubin Total: 0.5 mg/dL (ref 0.0–1.2)
CO2: 23 mmol/L (ref 20–29)
Calcium: 9.7 mg/dL (ref 8.7–10.2)
Chloride: 105 mmol/L (ref 96–106)
Creatinine, Ser: 0.9 mg/dL (ref 0.76–1.27)
Globulin, Total: 2.4 g/dL (ref 1.5–4.5)
Glucose: 80 mg/dL (ref 70–99)
Potassium: 4.8 mmol/L (ref 3.5–5.2)
Sodium: 142 mmol/L (ref 134–144)
Total Protein: 7 g/dL (ref 6.0–8.5)
eGFR: 101 mL/min/{1.73_m2} (ref >59)

## 2023-03-18 LAB — CBC
Hematocrit: 49 % (ref 37.5–51.0)
Hemoglobin: 16.3 g/dL (ref 13.0–17.7)
MCH: 30.1 pg (ref 26.6–33.0)
MCHC: 33.3 g/dL (ref 31.5–35.7)
MCV: 91 fL (ref 79–97)
Platelets: 233 10*3/uL (ref 150–450)
RBC: 5.41 x10E6/uL (ref 4.14–5.80)
RDW: 12.3 % (ref 11.6–15.4)
WBC: 6.2 10*3/uL (ref 3.4–10.8)

## 2023-03-21 ENCOUNTER — Telehealth: Payer: Self-pay | Admitting: Family Medicine

## 2023-03-21 NOTE — Telephone Encounter (Signed)
Per [facility] we have not received form.  Patient aware to have them re fax form.

## 2023-04-02 DIAGNOSIS — R1012 Left upper quadrant pain: Secondary | ICD-10-CM | POA: Diagnosis not present

## 2023-04-22 ENCOUNTER — Encounter (HOSPITAL_COMMUNITY): Payer: Self-pay

## 2023-04-28 ENCOUNTER — Ambulatory Visit (INDEPENDENT_AMBULATORY_CARE_PROVIDER_SITE_OTHER): Payer: 59 | Admitting: Surgery

## 2023-04-28 ENCOUNTER — Encounter: Payer: Self-pay | Admitting: Surgery

## 2023-04-28 VITALS — BP 119/81 | HR 71 | Temp 97.6°F | Resp 16 | Ht 66.0 in | Wt 184.0 lb

## 2023-04-28 DIAGNOSIS — K551 Chronic vascular disorders of intestine: Secondary | ICD-10-CM
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
## 2023-05-07 ENCOUNTER — Other Ambulatory Visit: Payer: Self-pay

## 2023-05-07 DIAGNOSIS — K551 Chronic vascular disorders of intestine: Secondary | ICD-10-CM

## 2023-05-07 DIAGNOSIS — Z823 Family history of stroke: Secondary | ICD-10-CM

## 2023-05-23 ENCOUNTER — Encounter (HOSPITAL_COMMUNITY): Payer: 59

## 2023-06-02 ENCOUNTER — Other Ambulatory Visit: Payer: Self-pay

## 2023-06-02 MED ORDER — DAPAGLIFLOZIN PROPANEDIOL 10 MG PO TABS: 10.0000 mg | ORAL_TABLET | Freq: Every day | ORAL | 7 refills | Status: DC

## 2023-06-05 ENCOUNTER — Ambulatory Visit: Admit: 2023-06-05 | Payer: 59 | Admitting: Orthopedic Surgery

## 2023-06-05 SURGERY — ARTHROPLASTY, KNEE, TOTAL, USING IMAGELESS COMPUTER-ASSISTED NAVIGATION
Anesthesia: Spinal | Site: Knee | Laterality: Left

## 2023-06-13 ENCOUNTER — Telehealth: Payer: Self-pay

## 2023-06-13 ENCOUNTER — Other Ambulatory Visit (HOSPITAL_COMMUNITY): Payer: Self-pay

## 2023-06-13 NOTE — Telephone Encounter (Signed)
Pharmacy Patient Advocate Encounter  Received notification from [facility] that Prior Authorization for [facility] 10MG  has been DENIED. Please advise how you'd like to proceed. Full denial letter will be uploaded to the media tab. See denial reason below.

## 2023-06-13 NOTE — Telephone Encounter (Signed)
Pharmacy Patient Advocate Encounter   Received notification from Patient Pharmacy ([facility]) that prior authorization for [facility] 10MG is required/requested. However London Pepper is the pref'd. A P/a has been submitted for Farxiga at this time as the pt has prev been on this therapy   Insurance verification completed.   The patient is insured through [facility] .   Per test claim: PA required; PA submitted to [facility] via CoverMyMeds Key/confirmation #/[id number]      Status is pending

## 2023-07-03 ENCOUNTER — Telehealth: Payer: Self-pay | Admitting: Cardiology

## 2023-07-03 DIAGNOSIS — Z79899 Other long term (current) drug therapy: Secondary | ICD-10-CM

## 2023-07-03 MED ORDER — EMPAGLIFLOZIN 25 MG PO TABS: 25.0000 mg | ORAL_TABLET | Freq: Every day | ORAL | 3 refills | Status: DC

## 2023-07-03 NOTE — Telephone Encounter (Signed)
Pt c/o medication issue:  1. Name of Medication:   dapagliflozin propanediol (FARXIGA) 10 MG TABS tablet   2. How are you currently taking this medication (dosage and times per day)?   3. Are you having a reaction (difficulty breathing--STAT)?   4. What is your medication issue?   Patient stated his insurance will not cover [place] and his pharmacy has faxed over medication patient can get through his insurance instead of [place].  Patient stated he is now out of medication and need prescription sent to [facility] [address] [place] - [address].

## 2023-07-03 NOTE — Telephone Encounter (Signed)
Spoke with pharmacy (kristin). She states patient insurance will no longer cover farxiga and his insurance prefer jardiance.  Spoke with patient and he is aware I will send message to provider to see of we can change from farxiga to [facility]

## 2023-07-03 NOTE — Telephone Encounter (Signed)
Left voicemail to return call to office.   Jardiance sent to pharmacy

## 2023-07-04 ENCOUNTER — Telehealth: Payer: Self-pay | Admitting: Cardiology

## 2023-07-04 ENCOUNTER — Telehealth: Payer: Self-pay

## 2023-07-04 NOTE — Telephone Encounter (Signed)
Advised of medication change and sent to pharmacy.  Also advise his follow up is due in February. He will call in December to make appointment

## 2023-07-04 NOTE — Telephone Encounter (Signed)
*  STAT* If patient is at the pharmacy, call can be transferred to refill team.   1. Which medications need to be refilled? (please list name of each medication and dose if known) needs prior authorization for Jardiance   2. Would you like to learn more about the convenience, safety, & potential cost savings by using the [facility]?     3. Are you open to using the [facility] (Type [facility]. .   4. Which pharmacy/location (including street and city if local pharmacy) is medication to be sent to? [facility] [place]   5. Do they need a 30 day or 90 day supply? 30 days

## 2023-07-07 ENCOUNTER — Other Ambulatory Visit (HOSPITAL_COMMUNITY): Payer: Self-pay

## 2023-07-07 ENCOUNTER — Telehealth: Payer: Self-pay | Admitting: Pharmacy Technician

## 2023-07-07 NOTE — Telephone Encounter (Signed)
Pharmacy Patient Advocate Encounter   Received notification from CoverMyMeds that prior authorization for Jardiance is required/requested.   Insurance verification completed.   The patient is insured through [facility] .   Per test claim:   per [employer] pt has other coverage active so cannot submit PA to [employer] until pt notifies plan that other coverage has been terminated.

## 2023-07-07 NOTE — Telephone Encounter (Signed)
Per The recent test claim I've ran, the patients Previous insurance Administrator) is still showing as Primary coverage. If this is not the case than the patient will need to call the customer service number listed on his medicaid card and make them aware that he no longer has another primary coverage.

## 2023-07-08 ENCOUNTER — Other Ambulatory Visit (HOSPITAL_COMMUNITY): Payer: Self-pay

## 2023-07-10 ENCOUNTER — Other Ambulatory Visit (HOSPITAL_COMMUNITY): Payer: Self-pay

## 2023-07-10 NOTE — Telephone Encounter (Signed)
Pharmacy Patient Advocate Encounter  Received notification from [facility] that Prior Authorization for Jardiance 25MG  tablets has been APPROVED from 07/10/2023 to 07/09/2024   PA #/Case ID/Reference #: [id number] Key: Yehuda [place]

## 2023-08-19 ENCOUNTER — Ambulatory Visit: Payer: Medicaid Other | Admitting: Family Medicine

## 2023-08-19 ENCOUNTER — Encounter: Payer: Self-pay | Admitting: Family Medicine

## 2023-08-19 VITALS — BP 132/85 | HR 60 | Temp 98.4°F | Ht 66.0 in | Wt 186.0 lb

## 2023-08-19 DIAGNOSIS — M51362 Other intervertebral disc degeneration, lumbar region with discogenic back pain and lower extremity pain: Secondary | ICD-10-CM | POA: Insufficient documentation

## 2023-08-19 DIAGNOSIS — R109 Unspecified abdominal pain: Secondary | ICD-10-CM

## 2023-08-19 DIAGNOSIS — M4150 Other secondary scoliosis, site unspecified: Secondary | ICD-10-CM | POA: Diagnosis not present

## 2023-08-19 DIAGNOSIS — K551 Chronic vascular disorders of intestine: Secondary | ICD-10-CM | POA: Insufficient documentation
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
## 2023-09-02 DIAGNOSIS — I10 Essential (primary) hypertension: Secondary | ICD-10-CM | POA: Diagnosis not present

## 2023-09-02 DIAGNOSIS — E1165 Type 2 diabetes mellitus with hyperglycemia: Secondary | ICD-10-CM | POA: Diagnosis not present

## 2023-09-02 DIAGNOSIS — Z125 Encounter for screening for malignant neoplasm of prostate: Secondary | ICD-10-CM | POA: Diagnosis not present

## 2023-10-01 DIAGNOSIS — R1012 Left upper quadrant pain: Secondary | ICD-10-CM | POA: Diagnosis not present

## 2023-12-09 ENCOUNTER — Encounter: Payer: Self-pay | Admitting: Family

## 2023-12-09 ENCOUNTER — Ambulatory Visit: Payer: Medicaid Other | Admitting: Family

## 2023-12-09 VITALS — BP 107/67 | HR 73 | Temp 97.1°F | Ht 66.0 in | Wt 193.4 lb

## 2023-12-09 DIAGNOSIS — R531 Weakness: Secondary | ICD-10-CM

## 2023-12-09 NOTE — Patient Instructions (Signed)
Stroke Prevention Some medical conditions and behaviors can lead to a higher chance of having a stroke. You can help prevent a stroke by eating healthy, exercising, not smoking, and managing any medical conditions you have. Stroke is a leading cause of functional impairment. Primary prevention is particularly important because a majority of strokes are first-time events. Stroke changes the lives of not only those who experience a stroke but also their family and other caregivers. How can this condition affect me? A stroke is a medical emergency and should be treated right away. A stroke can lead to brain damage and can sometimes be life-threatening. If a person gets medical treatment right away, there is a better chance of surviving and recovering from a stroke. What can increase my risk? The following medical conditions may increase your risk of a stroke: Cardiovascular disease. High blood pressure (hypertension). Diabetes. High cholesterol. Sickle cell disease. Blood clotting disorders (hypercoagulable state). Obesity. Sleep disorders (obstructive sleep apnea). Other risk factors include: Being older than age 66. Having a history of blood clots, stroke, or mini-stroke (transient ischemic attack, TIA). Genetic factors, such as race, ethnicity, or a family history of stroke. Smoking cigarettes or using other tobacco products. Taking birth control pills, especially if you also use tobacco. Heavy use of alcohol or drugs, especially cocaine and methamphetamine. Physical inactivity. What actions can I take to prevent this? Manage your health conditions High cholesterol levels. Eating a healthy diet is important for preventing high cholesterol. If cholesterol cannot be managed through diet alone, you may need to take medicines. Take any prescribed medicines to control your cholesterol as told by your health care provider. Hypertension. To reduce your risk of stroke, try to keep your blood  pressure below 130/80. Eating a healthy diet and exercising regularly are important for controlling blood pressure. If these steps are not enough to manage your blood pressure, you may need to take medicines. Take any prescribed medicines to control hypertension as told by your health care provider. Ask your health care provider if you should monitor your blood pressure at home. Have your blood pressure checked every year, even if your blood pressure is normal. Blood pressure increases with age and some medical conditions. Diabetes. Eating a healthy diet and exercising regularly are important parts of managing your blood sugar (glucose). If your blood sugar cannot be managed through diet and exercise, you may need to take medicines. Take any prescribed medicines to control your diabetes as told by your health care provider. Get evaluated for obstructive sleep apnea. Talk to your health care provider about getting a sleep evaluation if you snore a lot or have excessive sleepiness. Make sure that any other medical conditions you have, such as atrial fibrillation or atherosclerosis, are managed. Nutrition Follow instructions from your health care provider about what to eat or drink to help manage your health condition. These instructions may include: Reducing your daily calorie intake. Limiting how much salt (sodium) you use to 1,500 milligrams (mg) each day. Using only healthy fats for cooking, such as olive oil, canola oil, or sunflower oil. Eating healthy foods. You can do this by: Choosing foods that are high in fiber, such as whole grains, and fresh fruits and vegetables. Eating at least 5 servings of fruits and vegetables a day. Try to fill one-half of your plate with fruits and vegetables at each meal. Choosing lean protein foods, such as lean cuts of meat, poultry without skin, fish, tofu, beans, and nuts. Eating low-fat dairy products. Avoiding  foods that are high in sodium. This can help  lower blood pressure. Avoiding foods that have saturated fat, trans fat, and cholesterol. This can help prevent high cholesterol. Avoiding processed and prepared foods. Counting your daily carbohydrate intake.  Lifestyle If you drink alcohol: Limit how much you have to: 0-1 drink a day for women who are not pregnant. 0-2 drinks a day for men. Know how much alcohol is in your drink. In the [place] one drink equals one 12 oz bottle of beer ( ), one 5 oz glass of wine ( ), or one 1 oz glass of hard liquor (44mL). Do not use any products that contain nicotine or tobacco. These products include cigarettes, chewing tobacco, and vaping devices, such as e-cigarettes. If you need help quitting, ask your health care provider. Avoid secondhand smoke. Do not use drugs. Activity  Try to stay at a healthy weight. Get at least 30 minutes of exercise on most days, such as: Fast walking. Biking. Swimming. Medicines Take over-the-counter and prescription medicines only as told by your health care provider. Aspirin or blood thinners (antiplatelets or anticoagulants) may be recommended to reduce your risk of forming blood clots that can lead to stroke. Avoid taking birth control pills. Talk to your health care provider about the risks of taking birth control pills if: You are over 71 years old. You smoke. You get very bad headaches. You have had a blood clot. Where to find more information American Stroke Association: www.strokeassociation.org Get help right away if: You or a loved one has any symptoms of a stroke. "BE FAST" is an easy way to remember the main warning signs of a stroke: B - Balance. Signs are dizziness, sudden trouble walking, or loss of balance. E - Eyes. Signs are trouble seeing or a sudden change in vision. F - Face. Signs are sudden weakness or numbness of the face, or the face or eyelid drooping on one side. A - Arms. Signs are weakness or numbness in an arm. This happens  suddenly and usually on one side of the body. S - Speech. Signs are sudden trouble speaking, slurred speech, or trouble understanding what people say. T - Time. Time to call emergency services. Write down what time symptoms started. You or a loved one has other signs of a stroke, such as: A sudden, severe headache with no known cause. Nausea or vomiting. Seizure. These symptoms may represent a serious problem that is an emergency. Do not wait to see if the symptoms will go away. Get medical help right away. Call your local emergency services (911 in the [place] Do not drive yourself to the hospital. Summary You can help to prevent a stroke by eating healthy, exercising, not smoking, limiting alcohol intake, and managing any medical conditions you may have. Do not use any products that contain nicotine or tobacco. These include cigarettes, chewing tobacco, and vaping devices, such as e-cigarettes. If you need help quitting, ask your health care provider. Remember "BE FAST" for warning signs of a stroke. Get help right away if you or a loved one has any of these signs. This information is not intended to replace advice given to you by your health care provider. Make sure you discuss any questions you have with your health care provider. Document Revised: 09/30/2022 Document Reviewed: 09/30/2022 Elsevier Patient Education  2024 [employer].

## 2023-12-09 NOTE — Progress Notes (Signed)
Subjective:    Patient ID: Alex CASO, male    DOB: April 28, 1966, 58 y.o.   MRN: [id number]  No chief complaint on file.   HPI PT presents to the office today with numbness on left side that started two months ago. Reports numbness in left lips, left arm, and left leg. Reports left weakness in arm and left. He degenerative lumbar region, scoliosis that has been on going since 2023. However, this numbness is different than his normal back pain.  He had CT on 08/11/22 for left-sided weakness, but states that resolved and this is new.   He had gabapentin 300 mg TID prn, but does not like how it makes him drowsy.    Review of Systems  All other systems reviewed and are negative.      Objective:   Physical Exam Vitals reviewed.  Constitutional:      General: He is not in acute distress.    Appearance: He is well-developed.  HENT:     Head: Normocephalic.     Right Ear: Tympanic membrane and external ear normal.     Left Ear: Tympanic membrane and external ear normal.  Eyes:     General:        Right eye: No discharge.        Left eye: No discharge.     Pupils: Pupils are equal, round, and reactive to light.  Neck:     Thyroid: No thyromegaly.  Cardiovascular:     Rate and Rhythm: Normal rate and regular rhythm.     Heart sounds: Normal heart sounds. No murmur heard. Pulmonary:     Effort: Pulmonary effort is normal. No respiratory distress.     Breath sounds: Normal breath sounds. No wheezing.  Abdominal:     General: Bowel sounds are normal. There is no distension.     Palpations: Abdomen is soft.     Tenderness: There is no abdominal tenderness.  Musculoskeletal:        General: No tenderness. Normal range of motion.     Cervical back: Normal range of motion and neck supple.  Skin:    General: Skin is warm and dry.     Findings: No erythema or rash.  Neurological:     Mental Status: He is alert and oriented to person, place, and time.     Cranial Nerves: No  cranial nerve deficit.     Deep Tendon Reflexes: Reflexes are normal and symmetric.     Comments: Equal strength and grip bilaterally in hands. Decrease strength in left foot with flexion  Psychiatric:        Behavior: Behavior normal.        Thought Content: Thought content normal.        Judgment: Judgment normal.       BP 107/67   Pulse 73   Temp (!) 97.1 F (36.2 C)   Ht 5\' 6"  (1.676 m)   Wt 193 lb 6.4 oz (87.7 kg)   SpO2 97%   BMI 31.22 kg/m      Assessment & Plan:  Alex Wilson comes in today with chief complaint of Acute Visit (Left sided weakness )   Diagnosis and orders addressed:  1. Left-sided weakness (Primary) Will order MRI today  Referral to Neurologists pending  Keep follow up with PCP Stroke prevention discussed  Go to ED if symptoms worsen or do not improve  - MR Brain Wo Contrast; Future - Ambulatory referral to Neurology  Jannifer Rodney, FNP

## 2023-12-10 ENCOUNTER — Encounter: Payer: Self-pay | Admitting: Neurology

## 2023-12-18 ENCOUNTER — Ambulatory Visit (HOSPITAL_COMMUNITY)
Admission: RE | Admit: 2023-12-18 | Discharge: 2023-12-18 | Disposition: A | Discharge status: Home / Self Care | Payer: Medicaid Other | Source: Ambulatory Visit | Attending: Family | Admitting: Family

## 2023-12-18 DIAGNOSIS — R531 Weakness: Secondary | ICD-10-CM | POA: Diagnosis not present

## 2023-12-18 DIAGNOSIS — R200 Anesthesia of skin: Secondary | ICD-10-CM | POA: Diagnosis not present

## 2024-01-05 ENCOUNTER — Telehealth: Payer: Self-pay | Admitting: Family Medicine

## 2024-01-05 NOTE — Telephone Encounter (Signed)
 Lm on vm regarding referral. Also sent Mychart message. LS

## 2024-01-05 NOTE — Telephone Encounter (Signed)
 If it is bothersome, I'm glad to refer to dermatology to have this removed.  I assume it's under the scalp?
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
## 2024-01-06 ENCOUNTER — Other Ambulatory Visit: Payer: Self-pay

## 2024-01-06 ENCOUNTER — Encounter: Payer: Self-pay | Admitting: Dermatology

## 2024-01-06 ENCOUNTER — Ambulatory Visit: Payer: Medicaid Other | Admitting: Dermatology

## 2024-01-06 VITALS — BP 123/77

## 2024-01-06 DIAGNOSIS — L570 Actinic keratosis: Secondary | ICD-10-CM

## 2024-01-06 DIAGNOSIS — L814 Other melanin hyperpigmentation: Secondary | ICD-10-CM | POA: Diagnosis not present

## 2024-01-06 DIAGNOSIS — Z1283 Encounter for screening for malignant neoplasm of skin: Secondary | ICD-10-CM

## 2024-01-06 DIAGNOSIS — L309 Dermatitis, unspecified: Secondary | ICD-10-CM | POA: Diagnosis not present

## 2024-01-06 DIAGNOSIS — D1801 Hemangioma of skin and subcutaneous tissue: Secondary | ICD-10-CM | POA: Diagnosis not present

## 2024-01-06 DIAGNOSIS — M5412 Radiculopathy, cervical region: Secondary | ICD-10-CM | POA: Insufficient documentation

## 2024-01-06 DIAGNOSIS — S0085XS Superficial foreign body of other part of head, sequela: Secondary | ICD-10-CM

## 2024-01-06 DIAGNOSIS — R350 Frequency of micturition: Secondary | ICD-10-CM

## 2024-01-06 DIAGNOSIS — L578 Other skin changes due to chronic exposure to nonionizing radiation: Secondary | ICD-10-CM

## 2024-01-06 DIAGNOSIS — W908XXA Exposure to other nonionizing radiation, initial encounter: Secondary | ICD-10-CM | POA: Diagnosis not present

## 2024-01-06 DIAGNOSIS — L821 Other seborrheic keratosis: Secondary | ICD-10-CM

## 2024-01-06 DIAGNOSIS — D229 Melanocytic nevi, unspecified: Secondary | ICD-10-CM

## 2024-01-06 MED ORDER — TAMSULOSIN HCL 0.4 MG PO CAPS: 0.4000 mg | ORAL_CAPSULE | Freq: Every day | ORAL | 11 refills | Status: AC
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
## 2024-01-06 NOTE — Patient Instructions (Addendum)
 Hello Alex Wilson,  Thank you for visiting [place] today. Your dedication to maintaining your skin health and addressing your concerns is greatly appreciated. Below is a summary of our discussion and your personalized treatment plan:  Triamcinolone Cream: Start applying this cream daily for two weeks to the specified area. Should symptoms reappear, resume usage.  Seborrheic Keratosis Treatment: We have decided to freeze a few spots as a preventative measure. There is no risk of these turning into skin cancer.  BB Removal: Given its location and the degree of calcification, please consult your primary care physician for a referral to a general surgeon.   Skin Examination: Today, we treated a few actinic keratoses on your temples by freezing. Keep an eye on your skin for any new or changing lesions, particularly black spots or pimples that do not heal.  Sun Protection: Continue your use of sunscreen and wearing a hat while outdoors to prevent further sun damage.  Follow-Up: Please schedule a follow-up appointment in one year, or sooner if you notice any concerning changes in your skin.  Please adhere to the provided instructions carefully, and do not hesitate to reach out to our office if you have any questions or concerns.  Warm regards,  Dr. Langston Reusing, Dermatology   Cryotherapy Aftercare  Wash gently with soap and water everyday.   Apply Vaseline and Band-Aid daily until healed.   Important Information   Due to recent changes in healthcare laws, you may see results of your pathology and/or laboratory studies on MyChart before the doctors have had a chance to review them. We understand that in some cases there may be results that are confusing or concerning to you. Please understand that not all results are received at the same time and often the doctors may need to interpret multiple results in order to provide you with the best plan of care or course of treatment. Therefore, we ask that you  please give [place] 2 business days to thoroughly review all your results before contacting the office for clarification. Should we see a critical lab result, you will be contacted sooner.     If You Need Anything After Your Visit   If you have any questions or concerns for your doctor, please call our main line at [phone]. If no one answers, please leave a voicemail as directed and we will return your call as soon as possible. Messages left after 4 pm will be answered the following business day.    You may also send [place] a message via MyChart. We typically respond to MyChart messages within 1-2 business days.  For prescription refills, please ask your pharmacy to contact our office. Our fax number is [phone].  If you have an urgent issue when the clinic is closed that cannot wait until the next business day, you can page your doctor at the number below.     Please note that while we do our best to be available for urgent issues outside of office hours, we are not available 24/7.    If you have an urgent issue and are unable to reach [place], you may choose to seek medical care at your doctor's office, retail clinic, urgent care center, or emergency room.   If you have a medical emergency, please immediately call 911 or go to the emergency department. In the event of inclement weather, please call our main line at [phone] for an update on the status of any delays or closures.  Dermatology Medication Tips: Please keep  the boxes that topical medications come in in order to help keep track of the instructions about where and how to use these. Pharmacies typically print the medication instructions only on the boxes and not directly on the medication tubes.   If your medication is too expensive, please contact our office at [phone] or send [place] a message through MyChart.    We are unable to tell what your co-pay for medications will be in advance as this is different depending on your  insurance coverage. However, we may be able to find a substitute medication at lower cost or fill out paperwork to get insurance to cover a needed medication.    If a prior authorization is required to get your medication covered by your insurance company, please allow [place] 1-2 business days to complete this process.   Drug prices often vary depending on where the prescription is filled and some pharmacies may offer cheaper prices.   The website www.goodrx.com contains coupons for medications through different pharmacies. The prices here do not account for what the cost may be with help from insurance (it may be cheaper with your insurance), but the website can give you the price if you did not use any insurance.  - You can print the associated coupon and take it with your prescription to the pharmacy.  - You may also stop by our office during regular business hours and pick up a GoodRx coupon card.  - If you need your prescription sent electronically to a different pharmacy, notify our office through [facility] or by phone at [phone]

## 2024-01-13 ENCOUNTER — Ambulatory Visit: Payer: Medicaid Other | Admitting: Neurology

## 2024-01-13 ENCOUNTER — Other Ambulatory Visit

## 2024-01-13 ENCOUNTER — Encounter: Payer: Self-pay | Admitting: Neurology

## 2024-01-13 VITALS — BP 126/79 | HR 63 | Ht 66.0 in | Wt 196.0 lb

## 2024-01-13 DIAGNOSIS — R292 Abnormal reflex: Secondary | ICD-10-CM

## 2024-01-13 DIAGNOSIS — R531 Weakness: Secondary | ICD-10-CM | POA: Diagnosis not present

## 2024-01-13 DIAGNOSIS — G9389 Other specified disorders of brain: Secondary | ICD-10-CM

## 2024-01-13 DIAGNOSIS — R202 Paresthesia of skin: Secondary | ICD-10-CM | POA: Diagnosis not present

## 2024-01-13 NOTE — Patient Instructions (Signed)
 Labs MRI cervical spine
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
## 2024-01-15 ENCOUNTER — Encounter: Payer: Self-pay | Admitting: Neurology

## 2024-01-15 DIAGNOSIS — I10 Essential (primary) hypertension: Secondary | ICD-10-CM | POA: Diagnosis not present

## 2024-01-15 DIAGNOSIS — N1832 Chronic kidney disease, stage 3b: Secondary | ICD-10-CM | POA: Diagnosis not present

## 2024-01-15 DIAGNOSIS — E1121 Type 2 diabetes mellitus with diabetic nephropathy: Secondary | ICD-10-CM | POA: Diagnosis not present

## 2024-01-15 DIAGNOSIS — N529 Male erectile dysfunction, unspecified: Secondary | ICD-10-CM | POA: Diagnosis not present

## 2024-01-15 DIAGNOSIS — E7800 Pure hypercholesterolemia, unspecified: Secondary | ICD-10-CM | POA: Diagnosis not present

## 2024-01-16 LAB — PROTEIN ELECTROPHORESIS, SERUM
Albumin ELP: 4.3 g/dL (ref 3.8–4.8)
Alpha 1: 0.3 g/dL (ref 0.2–0.3)
Alpha 2: 0.6 g/dL (ref 0.5–0.9)
Beta 2: 0.4 g/dL (ref 0.2–0.5)
Beta Globulin: 0.5 g/dL (ref 0.4–0.6)
Gamma Globulin: 1 g/dL (ref 0.8–1.7)
Total Protein: 7 g/dL (ref 6.1–8.1)

## 2024-01-16 LAB — IMMUNOFIXATION ELECTROPHORESIS
IgG (Immunoglobin G), Serum: 1095 mg/dL (ref 600–1640)
IgM, Serum: 87 mg/dL (ref 50–300)
Immunoglobulin A: 215 mg/dL (ref 47–310)

## 2024-01-16 LAB — B12 AND FOLATE PANEL
Folate: >24 ng/mL
Vitamin B-12: 467 pg/mL (ref 200–1100)

## 2024-01-16 LAB — TSH: TSH: 2.95 m[IU]/L (ref 0.40–4.50)

## 2024-01-16 LAB — VITAMIN B1: Vitamin B1 (Thiamine): 92 nmol/L — ABNORMAL HIGH (ref 8–30)

## 2024-01-22 DIAGNOSIS — R1012 Left upper quadrant pain: Secondary | ICD-10-CM | POA: Diagnosis not present

## 2024-01-22 DIAGNOSIS — R1013 Epigastric pain: Secondary | ICD-10-CM | POA: Diagnosis not present

## 2024-01-22 DIAGNOSIS — K5900 Constipation, unspecified: Secondary | ICD-10-CM | POA: Diagnosis not present

## 2024-01-26 ENCOUNTER — Ambulatory Visit
Admission: RE | Admit: 2024-01-26 | Discharge: 2024-01-26 | Disposition: A | Discharge status: Home / Self Care | Source: Ambulatory Visit | Attending: Neurology

## 2024-01-26 DIAGNOSIS — R292 Abnormal reflex: Secondary | ICD-10-CM

## 2024-01-26 DIAGNOSIS — G9389 Other specified disorders of brain: Secondary | ICD-10-CM

## 2024-01-26 DIAGNOSIS — M4802 Spinal stenosis, cervical region: Secondary | ICD-10-CM | POA: Diagnosis not present

## 2024-01-26 DIAGNOSIS — R202 Paresthesia of skin: Secondary | ICD-10-CM

## 2024-01-26 DIAGNOSIS — M47812 Spondylosis without myelopathy or radiculopathy, cervical region: Secondary | ICD-10-CM | POA: Diagnosis not present

## 2024-01-26 DIAGNOSIS — R531 Weakness: Secondary | ICD-10-CM

## 2024-02-05 ENCOUNTER — Telehealth: Payer: Self-pay | Admitting: Cardiology

## 2024-02-05 MED ORDER — ROSUVASTATIN CALCIUM 20 MG PO TABS: 20.0000 mg | ORAL_TABLET | Freq: Every day | ORAL | 0 refills | Status: DC

## 2024-02-05 NOTE — Telephone Encounter (Signed)
 Pt's medication was sent to pt's pharmacy as requested. Confirmation received.

## 2024-02-05 NOTE — Telephone Encounter (Signed)
*  STAT* If patient is at the pharmacy, call can be transferred to refill team.   1. Which medications need to be refilled? (please list name of each medication and dose if known) rosuvastatin (CRESTOR) 20 MG tablet   2. Which pharmacy/location (including street and city if local pharmacy) is medication to be sent to?  [facility] [address], [place] - [address]    3. Do they need a 30 day or 90 day supply? 90

## 2024-02-09 ENCOUNTER — Ambulatory Visit: Admitting: Urology

## 2024-02-09 ENCOUNTER — Encounter: Payer: Self-pay | Admitting: Family Medicine

## 2024-02-09 ENCOUNTER — Ambulatory Visit (INDEPENDENT_AMBULATORY_CARE_PROVIDER_SITE_OTHER): Payer: Medicaid Other | Admitting: Family Medicine

## 2024-02-09 VITALS — BP 100/68 | HR 78 | Temp 98.1°F | Ht 66.0 in | Wt 191.0 lb

## 2024-02-09 DIAGNOSIS — Z125 Encounter for screening for malignant neoplasm of prostate: Secondary | ICD-10-CM | POA: Diagnosis not present

## 2024-02-09 DIAGNOSIS — K551 Chronic vascular disorders of intestine: Secondary | ICD-10-CM

## 2024-02-09 DIAGNOSIS — E669 Obesity, unspecified: Secondary | ICD-10-CM

## 2024-02-09 DIAGNOSIS — Z23 Encounter for immunization: Secondary | ICD-10-CM | POA: Diagnosis not present

## 2024-02-09 DIAGNOSIS — Z6830 Body mass index (BMI) 30.0-30.9, adult: Secondary | ICD-10-CM | POA: Diagnosis not present

## 2024-02-09 DIAGNOSIS — D500 Iron deficiency anemia secondary to blood loss (chronic): Secondary | ICD-10-CM

## 2024-02-09 DIAGNOSIS — Z0000 Encounter for general adult medical examination without abnormal findings: Secondary | ICD-10-CM

## 2024-02-09 DIAGNOSIS — E7800 Pure hypercholesterolemia, unspecified: Secondary | ICD-10-CM

## 2024-02-09 DIAGNOSIS — Z0001 Encounter for general adult medical examination with abnormal findings: Secondary | ICD-10-CM

## 2024-02-09 DIAGNOSIS — I2510 Atherosclerotic heart disease of native coronary artery without angina pectoris: Secondary | ICD-10-CM | POA: Diagnosis not present

## 2024-02-09 MED ORDER — SEMAGLUTIDE-WEIGHT MANAGEMENT 0.25 MG/0.5ML ~~LOC~~ SOAJ: 0.2500 mg | SUBCUTANEOUS | 0 refills | Status: AC

## 2024-02-09 MED ORDER — SEMAGLUTIDE-WEIGHT MANAGEMENT 1 MG/0.5ML ~~LOC~~ SOAJ: 1.0000 mg | SUBCUTANEOUS | 0 refills | Status: AC

## 2024-02-09 MED ORDER — SEMAGLUTIDE-WEIGHT MANAGEMENT 2.4 MG/0.75ML ~~LOC~~ SOAJ: 2.4000 mg | SUBCUTANEOUS | 12 refills | Status: AC

## 2024-02-09 MED ORDER — SEMAGLUTIDE-WEIGHT MANAGEMENT 0.5 MG/0.5ML ~~LOC~~ SOAJ: 0.5000 mg | SUBCUTANEOUS | 0 refills | Status: AC

## 2024-02-09 MED ORDER — SEMAGLUTIDE-WEIGHT MANAGEMENT 1.7 MG/0.75ML ~~LOC~~ SOAJ: 1.7000 mg | SUBCUTANEOUS | 0 refills | Status: AC

## 2024-02-09 NOTE — Patient Instructions (Signed)
Tips for success with Wegovy (and by success, how not to be super sick on your stomach): Eat small meals AVOID heavy foods (fried/ high in carbs like bread, pasta, rice) AVOID carbonated beverages (soda/ beer, as these can increase bloating) DOUBLE your water intake (will help you avoid constipation/ dehydration)  ZOXWRU CAN cause: Nausea Abdominal pain Increased acid reflux (sometimes presents as "sour burps") Constipation OR Diarrhea Fatigue (especially when you first start it)
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
## 2024-02-10 ENCOUNTER — Encounter: Payer: Self-pay | Admitting: Family Medicine

## 2024-02-10 LAB — CMP14+EGFR
ALT: 21 IU/L (ref 0–44)
AST: 20 IU/L (ref 0–40)
Albumin: 4.4 g/dL (ref 3.8–4.9)
Alkaline Phosphatase: 71 IU/L (ref 44–121)
BUN/Creatinine Ratio: 20 (ref 9–20)
BUN: 17 mg/dL (ref 6–24)
Bilirubin Total: 0.4 mg/dL (ref 0.0–1.2)
CO2: 21 mmol/L (ref 20–29)
Calcium: 9.3 mg/dL (ref 8.7–10.2)
Chloride: 106 mmol/L (ref 96–106)
Creatinine, Ser: 0.87 mg/dL (ref 0.76–1.27)
Globulin, Total: 2.5 g/dL (ref 1.5–4.5)
Glucose: 89 mg/dL (ref 70–99)
Potassium: 4.5 mmol/L (ref 3.5–5.2)
Sodium: 140 mmol/L (ref 134–144)
Total Protein: 6.9 g/dL (ref 6.0–8.5)
eGFR: 101 mL/min/{1.73_m2} (ref >59)

## 2024-02-10 LAB — LIPID PANEL
Chol/HDL Ratio: 2.6 ratio (ref 0.0–5.0)
Cholesterol, Total: 126 mg/dL (ref 100–199)
HDL: 48 mg/dL (ref >39)
LDL Chol Calc (NIH): 68 mg/dL (ref 0–99)
Triglycerides: 39 mg/dL (ref 0–149)
VLDL Cholesterol Cal: 10 mg/dL (ref 5–40)

## 2024-02-10 LAB — CBC
Hematocrit: 48.3 % (ref 37.5–51.0)
Hemoglobin: 16.3 g/dL (ref 13.0–17.7)
MCH: 29.9 pg (ref 26.6–33.0)
MCHC: 33.7 g/dL (ref 31.5–35.7)
MCV: 89 fL (ref 79–97)
Platelets: 230 10*3/uL (ref 150–450)
RBC: 5.45 x10E6/uL (ref 4.14–5.80)
RDW: 12.4 % (ref 11.6–15.4)
WBC: 4.8 10*3/uL (ref 3.4–10.8)

## 2024-02-10 LAB — TSH: TSH: 2.65 u[IU]/mL (ref 0.450–4.500)

## 2024-02-10 LAB — PSA: Prostate Specific Ag, Serum: 0.5 ng/mL (ref 0.0–4.0)
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
## 2024-02-12 ENCOUNTER — Ambulatory Visit: Attending: General Practice | Admitting: General Practice

## 2024-02-12 ENCOUNTER — Encounter: Payer: Self-pay | Admitting: General Practice

## 2024-02-12 VITALS — BP 110/70 | HR 68 | Ht 66.0 in | Wt 192.0 lb

## 2024-02-12 DIAGNOSIS — I2510 Atherosclerotic heart disease of native coronary artery without angina pectoris: Secondary | ICD-10-CM | POA: Diagnosis not present

## 2024-02-12 DIAGNOSIS — I351 Nonrheumatic aortic (valve) insufficiency: Secondary | ICD-10-CM

## 2024-02-12 DIAGNOSIS — E785 Hyperlipidemia, unspecified: Secondary | ICD-10-CM | POA: Diagnosis not present

## 2024-02-12 MED ORDER — ROSUVASTATIN CALCIUM 20 MG PO TABS: 20.0000 mg | ORAL_TABLET | Freq: Every day | ORAL | 1 refills | Status: DC

## 2024-02-12 NOTE — Patient Instructions (Addendum)
 Medication Instructions:  The current medical regimen is effective;  continue present plan and medications as directed. Please refer to the Current Medication list given to you today.  *If you need a refill on your cardiac medications before your next appointment, please call your pharmacy*  Lab Work: NONE If you have labs (blood work) drawn today and your tests are completely normal, you will receive your results only by:  MyChart Message (if you have MyChart) OR A paper copy in the mail If you have any lab test that is abnormal or we need to change your treatment, we will call you to review the results.  Testing/Procedures: Your physician has requested that you have an echocardiogram. Echocardiography is a painless test that uses sound waves to create images of your heart. It provides your doctor with information about the size and shape of your heart and how well your heart's chambers and valves are working. This procedure takes approximately one hour. There are no restrictions for this procedure. Please do NOT wear cologne, perfume, aftershave, or lotions (deodorant is allowed). Please arrive 15 minutes prior to your appointment time.  Please note: We ask at that you not bring children with you during ultrasound (echo/ vascular) testing. Due to room size and safety concerns, children are not allowed in the ultrasound rooms during exams. Our front office staff cannot provide observation of children in our lobby area while testing is being conducted. An adult accompanying a patient to their appointment will only be allowed in the ultrasound room at the discretion of the ultrasound technician under special circumstances. We apologize for any inconvenience.   MAINTAIN YOUR PHYSICAL ACTIVITY  Follow-Up: At [facility], you and your health needs are our priority.  As part of our continuing mission to provide you with exceptional heart care, our providers are all part of one team.  This  team includes your primary Cardiologist (physician) and Advanced Practice Providers or APPs (Physician Assistants and Nurse Practitioners) who all work together to provide you with the care you need, when you need it.  Your next appointment:   2-3 month(s)  Provider:   Rollene Rotunda, MD    Other Instructions FOLLOW ATTACHED HEART HEALTHY DIET  Heart-Healthy Eating Plan Eating a healthy diet is important for the health of your heart. A heart-healthy eating plan includes: Eating less unhealthy fats. Eating more healthy fats. Eating less salt in your food. Salt is also called sodium. Making other changes in your diet. Talk with your doctor or a diet specialist (dietitian) to create an eating plan that is right for you. What is my plan? What are tips for following this plan? Cooking Avoid frying your food. Try to bake, boil, grill, or broil it instead. You can also reduce fat by: Removing the skin from poultry. Removing all visible fats from meats. Steaming vegetables in water or broth. Meal planning  At meals, divide your plate into four equal parts: Fill one-half of your plate with vegetables and green salads. Fill one-fourth of your plate with whole grains. Fill one-fourth of your plate with lean protein foods. Eat 2-4 cups of vegetables per day. One cup of vegetables is: 1 cup (91 g) broccoli or cauliflower florets. 2 medium carrots. 1 large bell pepper. 1 large sweet potato. 1 large tomato. 1 medium white potato. 2 cups (150 g) raw leafy greens. Eat 1-2 cups of fruit per day. One cup of fruit is: 1 small apple 1 large banana 1 cup (237 g) mixed  fruit, 1 large orange,  cup (82 g) dried fruit, 1 cup (240 mL) 100% fruit juice. Eat more foods that have soluble fiber. These are apples, broccoli, carrots, beans, peas, and barley. Try to get 20-30 g of fiber per day. Eat 4-5 servings of nuts, legumes, and seeds per week: 1 serving of dried beans or legumes equals  cup  (90 g) cooked. 1 serving of nuts is  oz (12 almonds, 24 pistachios, or 7 walnut halves). 1 serving of seeds equals  oz (8 g). General information Eat more home-cooked food. Eat less restaurant, buffet, and fast food. Limit or avoid alcohol. Limit foods that are high in starch and sugar. Avoid fried foods. Lose weight if you are overweight. Keep track of how much salt (sodium) you eat. This is important if you have high blood pressure. Ask your doctor to tell you more about this. Try to add vegetarian meals each week. Fats Choose healthy fats. These include olive oil and canola oil, flaxseeds, walnuts, almonds, and seeds. Eat more omega-3 fats. These include salmon, mackerel, sardines, tuna, flaxseed oil, and ground flaxseeds. Try to eat fish at least 2 times each week. Check food labels. Avoid foods with trans fats or high amounts of saturated fat. Limit saturated fats. These are often found in animal products, such as meats, butter, and cream. These are also found in plant foods, such as palm oil, palm kernel oil, and coconut oil. Avoid foods with partially hydrogenated oils in them. These have trans fats. Examples are stick margarine, some tub margarines, cookies, crackers, and other baked goods. What foods should I eat? Fruits All fresh, canned (in natural juice), or frozen fruits. Vegetables Fresh or frozen vegetables (raw, steamed, roasted, or grilled). Green salads. Grains Most grains. Choose whole wheat and whole grains most of the time. Rice and pasta, including brown rice and pastas made with whole wheat. Meats and other proteins Lean, well-trimmed beef, veal, pork, and lamb. Chicken and [place] without skin. All fish and shellfish. Wild duck, rabbit, pheasant, and venison. Egg whites or low-cholesterol egg substitutes. Dried beans, peas, lentils, and tofu. Seeds and most nuts. Dairy Low-fat or nonfat cheeses, including ricotta and mozzarella. Skim or 1% milk that is liquid,  powdered, or evaporated. Buttermilk that is made with low-fat milk. Nonfat or low-fat yogurt. Fats and oils Non-hydrogenated (trans-free) margarines. Vegetable oils, including soybean, sesame, sunflower, olive, peanut, safflower, corn, canola, and cottonseed. Salad dressings or mayonnaise made with a vegetable oil. Beverages Mineral water. Coffee and tea. Diet carbonated beverages. Sweets and desserts Sherbet, gelatin, and fruit ice. Small amounts of dark chocolate. Limit all sweets and desserts. Seasonings and condiments All seasonings and condiments. The items listed above may not be a complete list of foods and drinks you can eat. Contact a dietitian for more options. What foods should I avoid? Fruits Canned fruit in heavy syrup. Fruit in cream or butter sauce. Fried fruit. Limit coconut. Vegetables Vegetables cooked in cheese, cream, or butter sauce. Fried vegetables. Grains Breads that are made with saturated or trans fats, oils, or whole milk. Croissants. Sweet rolls. Donuts. High-fat crackers, such as cheese crackers. Meats and other proteins Fatty meats, such as hot dogs, ribs, sausage, bacon, rib-eye roast or steak. High-fat deli meats, such as salami and bologna. Caviar. Domestic duck and goose. Organ meats, such as liver. Dairy Cream, sour cream, cream cheese, and creamed cottage cheese. Whole-milk cheeses. Whole or 2% milk that is liquid, evaporated, or condensed. Whole buttermilk. Cream sauce or  high-fat cheese sauce. Yogurt that is made from whole milk. Fats and oils Meat fat, or shortening. Cocoa butter, hydrogenated oils, palm oil, coconut oil, palm kernel oil. Solid fats and shortenings, including bacon fat, salt pork, lard, and butter. Nondairy cream substitutes. Salad dressings with cheese or sour cream. Beverages Regular sodas and juice drinks with added sugar. Sweets and desserts Frosting. Pudding. Cookies. Cakes. Pies. Milk chocolate or white chocolate. Buttered  syrups. Full-fat ice cream or ice cream drinks. The items listed above may not be a complete list of foods and drinks to avoid. Contact a dietitian for more information. Summary Heart-healthy meal planning includes eating less unhealthy fats, eating more healthy fats, and making other changes in your diet. Eat a balanced diet. This includes fruits and vegetables, low-fat or nonfat dairy, lean protein, nuts and legumes, whole grains, and heart-healthy oils and fats. This information is not intended to replace advice given to you by your health care provider. Make sure you discuss any questions you have with your health care provider. Document Revised: 12/03/2021 Document Reviewed: 12/03/2021 Elsevier Patient Education  2024 Elsevier Inc.      1st Floor: - Lobby - Registration  - Pharmacy  - Lab - Cafe  2nd Floor: - PV Lab - Diagnostic Testing (echo, CT, nuclear med)  3rd Floor: - Vacant  4th Floor: - TCTS (cardiothoracic surgery) - AFib Clinic - Structural Heart Clinic - Vascular Surgery  - Vascular Ultrasound  5th Floor: - HeartCare Cardiology (general and EP) - Clinical Pharmacy for coumadin, hypertension, lipid, weight-loss medications, and med management appointments    Valet parking services will be available as well.

## 2024-02-16 DIAGNOSIS — G5602 Carpal tunnel syndrome, left upper limb: Secondary | ICD-10-CM | POA: Diagnosis not present

## 2024-02-16 DIAGNOSIS — G5692 Unspecified mononeuropathy of left upper limb: Secondary | ICD-10-CM | POA: Diagnosis not present

## 2024-02-17 ENCOUNTER — Telehealth: Payer: Self-pay

## 2024-02-17 NOTE — Telephone Encounter (Signed)
*  Primary   Pharmacy Patient Advocate Encounter   Received notification from CoverMyMeds that prior authorization for [facility] 0.25MG /0.5ML auto-injectors  is required/requested.   Insurance verification completed.   The patient is insured through [facility] .   Per test claim: PA required; PA submitted to above mentioned insurance via CoverMyMeds Key/confirmation #/EOC [id number] Status is pending

## 2024-02-18 ENCOUNTER — Telehealth: Payer: Self-pay | Admitting: Pharmacist

## 2024-02-18 NOTE — Telephone Encounter (Signed)
 Can you please look into this?  He is obese and has CAD, not sure how it's not getting covered.

## 2024-02-18 NOTE — Telephone Encounter (Signed)
 Yes, also linked to rx (along with CAD, HLD) when sent and in the A/P section of the note.

## 2024-02-18 NOTE — Telephone Encounter (Signed)
 noted

## 2024-02-18 NOTE — Telephone Encounter (Signed)
 Pharmacy Patient Advocate Encounter  Received notification from [facility] that Prior Authorization for Wegovy 0.25MG /0.5ML auto-injectors has been DENIED.  Full denial letter will be uploaded to the media tab. See denial reason below.   PA #/Case ID/Reference #: [id number]

## 2024-02-18 NOTE — Telephone Encounter (Signed)
 Please appeal/should have been approved  Patient's BMI was measured on 02/09/24 in the PCP note (at the very top with all vitals)  Current BMI 30.99 Currently engaging in low calorie diet and 150 min of exercise per week The patient is currently on and will continue lifestyle modification including structured nutrition and physical activity The patient will NOT be using the requested agent in combination with another GLP-1 receptor agonist agent AND The patient does NOT have any FDA-labeled contraindications to the requested agent, including pregnancy, lactation, history of medullary thyroid cancer or multiple endocrine neoplasia type II.

## 2024-02-18 NOTE — Telephone Encounter (Signed)
 Appeal has been submitted for [facility]. Will advise when response is received, please be advised that most companies may take 30 days to make a decision. Appeal letter and supporting documentation have been faxed [employer] [phone] on 02/18/2024 @4 :06pm.  Thank you, Dellie Burns, PharmD Clinical Pharmacist  [place]  Direct Dial: [phone]

## 2024-02-24 ENCOUNTER — Telehealth: Payer: Self-pay

## 2024-02-24 NOTE — Telephone Encounter (Signed)
 PA appealed on 02/18/24. Has a determination been reached on PA?

## 2024-02-25 ENCOUNTER — Other Ambulatory Visit: Payer: Self-pay

## 2024-02-25 DIAGNOSIS — R531 Weakness: Secondary | ICD-10-CM

## 2024-02-25 DIAGNOSIS — R292 Abnormal reflex: Secondary | ICD-10-CM

## 2024-02-26 ENCOUNTER — Other Ambulatory Visit (HOSPITAL_COMMUNITY): Payer: Self-pay

## 2024-02-26 DIAGNOSIS — M25511 Pain in right shoulder: Secondary | ICD-10-CM | POA: Diagnosis not present

## 2024-02-26 DIAGNOSIS — Z96612 Presence of left artificial shoulder joint: Secondary | ICD-10-CM | POA: Diagnosis not present

## 2024-02-26 DIAGNOSIS — M542 Cervicalgia: Secondary | ICD-10-CM | POA: Diagnosis not present

## 2024-02-26 NOTE — Telephone Encounter (Signed)
 Called [phone]) to check on the status of the [facility] appeal, it is still under review.  The due date for the decision is 03/19/2024.

## 2024-03-01 ENCOUNTER — Ambulatory Visit (HOSPITAL_COMMUNITY)
Admission: RE | Admit: 2024-03-01 | Discharge: 2024-03-01 | Disposition: A | Discharge status: Home / Self Care | Source: Ambulatory Visit | Attending: General Practice | Admitting: General Practice

## 2024-03-01 DIAGNOSIS — I351 Nonrheumatic aortic (valve) insufficiency: Secondary | ICD-10-CM | POA: Diagnosis not present

## 2024-03-01 LAB — ECHOCARDIOGRAM COMPLETE
AR max vel: 2.97 cm2
AV Area VTI: 2.85 cm2
AV Area mean vel: 2.81 cm2
AV Mean grad: 7.4 mmHg
AV Peak grad: 11.6 mmHg
Ao pk vel: 1.71 m/s
Area-P 1/2: 2.76 cm2
P 1/2 time: 674 ms
S' Lateral: 2.5 cm

## 2024-03-01 NOTE — Progress Notes (Signed)
*  PRELIMINARY RESULTS* Echocardiogram 2D Echocardiogram has been performed.  Alex Wilson 03/01/2024, 10:16 AM

## 2024-03-03 ENCOUNTER — Other Ambulatory Visit (HOSPITAL_COMMUNITY): Payer: Self-pay

## 2024-03-05 ENCOUNTER — Other Ambulatory Visit: Payer: Self-pay | Admitting: Cardiology

## 2024-03-08 ENCOUNTER — Other Ambulatory Visit: Payer: Self-pay

## 2024-03-08 MED ORDER — DAPAGLIFLOZIN PROPANEDIOL 10 MG PO TABS: 10.0000 mg | ORAL_TABLET | Freq: Every day | ORAL | 3 refills | Status: DC

## 2024-03-09 DIAGNOSIS — M542 Cervicalgia: Secondary | ICD-10-CM | POA: Diagnosis not present

## 2024-03-15 ENCOUNTER — Ambulatory Visit: Admitting: Urology

## 2024-03-15 ENCOUNTER — Other Ambulatory Visit (HOSPITAL_COMMUNITY): Payer: Self-pay

## 2024-03-15 VITALS — BP 129/85 | HR 77

## 2024-03-15 DIAGNOSIS — N138 Other obstructive and reflux uropathy: Secondary | ICD-10-CM | POA: Diagnosis not present

## 2024-03-15 DIAGNOSIS — N529 Male erectile dysfunction, unspecified: Secondary | ICD-10-CM

## 2024-03-15 DIAGNOSIS — N401 Enlarged prostate with lower urinary tract symptoms: Secondary | ICD-10-CM | POA: Diagnosis not present

## 2024-03-15 MED ORDER — SILDENAFIL CITRATE 20 MG PO TABS: ORAL_TABLET | ORAL | 11 refills | Status: AC
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
## 2024-03-16 ENCOUNTER — Ambulatory Visit: Payer: Self-pay

## 2024-03-16 ENCOUNTER — Other Ambulatory Visit (HOSPITAL_COMMUNITY): Payer: Self-pay

## 2024-03-16 DIAGNOSIS — M6281 Muscle weakness (generalized): Secondary | ICD-10-CM | POA: Diagnosis not present

## 2024-03-16 DIAGNOSIS — M542 Cervicalgia: Secondary | ICD-10-CM | POA: Diagnosis not present

## 2024-03-16 LAB — URINALYSIS, ROUTINE W REFLEX MICROSCOPIC
Bilirubin, UA: NEGATIVE
Ketones, UA: NEGATIVE
Leukocytes,UA: NEGATIVE
Nitrite, UA: NEGATIVE
Protein,UA: NEGATIVE
RBC, UA: NEGATIVE
Specific Gravity, UA: <1.005 — ABNORMAL LOW (ref 1.005–1.030)
Urobilinogen, Ur: 0.2 mg/dL (ref 0.2–1.0)
pH, UA: 7 (ref 5.0–7.5)

## 2024-03-16 NOTE — Telephone Encounter (Signed)
 Pt came in the office today and stated that the PA was only approved for the 2.5 , [facility] then called me and stated she spoke to someone from our office ( she did not it was e2c2) and they told her they can see it wasn't approved

## 2024-03-16 NOTE — Telephone Encounter (Signed)
 Chief Complaint: medication   Disposition: [] ED /[] Urgent Care (no appt availability in office) / [] Appointment(In office/virtual)/ []  [facility]/ [x] Home Care/ [] Refused Recommended Disposition /[] [facility]/ [x]  Follow-up with PCP Additional Notes: Cleatis Curio from [place] pharmacy called stating pt at pharmacy and need PA. Called and Amalia Badder Pharmacist with [facility] and transferred call to her for help as PA is showing approved.   Reason for Disposition  [1] Pharmacy calling with prescription question AND [2] triager unable to answer question  Protocols used: Medication Question Call-A-AH

## 2024-03-16 NOTE — Telephone Encounter (Signed)
 Insurance has approved the appeal for Wegovy :    Thank you, Dene Fines, PharmD Clinical Pharmacist  [place]  Direct Dial: [phone]

## 2024-03-16 NOTE — Telephone Encounter (Signed)
 Pt has been made aware , coming by the office on Thursday for help on his first injection

## 2024-03-17 ENCOUNTER — Telehealth: Payer: Self-pay

## 2024-03-17 ENCOUNTER — Other Ambulatory Visit (HOSPITAL_COMMUNITY): Payer: Self-pay

## 2024-03-17 ENCOUNTER — Telehealth: Payer: Self-pay | Admitting: Pharmacist

## 2024-03-17 NOTE — Telephone Encounter (Signed)
 Wegovy  has been approved for Alex Wilson through 09/13/2024.  This prescription has been filled at his preferred pharmacy, [facility] in [place].  I have spoken to Alex Wilson to let him know it is ready to pick up for a copay of $4.00.

## 2024-03-17 NOTE — Telephone Encounter (Signed)
 Spoke with patient, the appeal is still under review.  The approval was incorrectly documented.  Alex Wilson has my direct number and I am in contact with the insurance company and will keep him updated.

## 2024-03-17 NOTE — Telephone Encounter (Signed)
 Pharmacy Patient Advocate Encounter   Received notification from [employer] that prior authorization for Wegovy  0.5MG /0.5ML auto-injectors is required/requested.   Insurance verification completed.   The patient is insured through [facility] .   Per test claim: PA required; PA submitted to above mentioned insurance via CoverMyMeds Key/confirmation #/EOC [id number] Status is pending

## 2024-03-18 ENCOUNTER — Ambulatory Visit (INDEPENDENT_AMBULATORY_CARE_PROVIDER_SITE_OTHER)

## 2024-03-18 NOTE — Progress Notes (Signed)
 Patient is in office today for a nurse visit for Medication Education. Patient was provided with instruction and demonstration on how to Administer Medication Wegovy .

## 2024-03-22 DIAGNOSIS — M6281 Muscle weakness (generalized): Secondary | ICD-10-CM | POA: Diagnosis not present

## 2024-03-22 DIAGNOSIS — M542 Cervicalgia: Secondary | ICD-10-CM | POA: Diagnosis not present

## 2024-03-23 DIAGNOSIS — R1013 Epigastric pain: Secondary | ICD-10-CM | POA: Diagnosis not present

## 2024-03-23 DIAGNOSIS — R1012 Left upper quadrant pain: Secondary | ICD-10-CM | POA: Diagnosis not present

## 2024-03-23 DIAGNOSIS — K5900 Constipation, unspecified: Secondary | ICD-10-CM | POA: Diagnosis not present

## 2024-04-01 ENCOUNTER — Ambulatory Visit: Admitting: Neurology

## 2024-04-01 DIAGNOSIS — G5602 Carpal tunnel syndrome, left upper limb: Secondary | ICD-10-CM

## 2024-04-01 DIAGNOSIS — R292 Abnormal reflex: Secondary | ICD-10-CM

## 2024-04-01 DIAGNOSIS — R531 Weakness: Secondary | ICD-10-CM | POA: Diagnosis not present

## 2024-04-01 DIAGNOSIS — G5690 Unspecified mononeuropathy of unspecified upper limb: Secondary | ICD-10-CM

## 2024-04-01 NOTE — Procedures (Signed)
 [facility]  [address] [place], [address]  [place], [place] [address] Tel: [phone] Fax: [phone] Test Date:  04/01/2024  Patient: Alex Wilson DOB: [birth year] Physician: Reyna Cava, DO  Sex: Male Height: 5\' 6"  Ref Phys: Reyna Cava, DO  ID#: [id number]   Technician:    History: This is a 58 year old man with history of left axillary neuropathy referred for evaluation of left side numbness.  NCV & EMG Findings: Extensive electrodiagnostic testing of the left upper and lower extremity shows:  Left median sensory response shows prolonged latency (3.8 ms).  Left ulnar, sural, and superficial peroneal sensory responses are within normal limits.  Left median, ulnar, peroneal, and and tibial motor responses are within normal limits.  Left tibial H reflex study is within normal limits.  Despite maximal activation, no motor unit recruitment was seen in the left deltoid muscle.  There is no evidence of accompanied active denervation.  In the left lower extremity, there is no evidence of active or chronic motor axonal loss changes affecting any of the tested muscles.  Motor unit configuration and recruitment pattern is within normal limits.   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 Left median neuropathy at or distal to the wrist, consistent with a clinical diagnosis of carpal tunnel syndrome.  Overall, these findings are mild in degree electrically. There is no evidence of a large fiber sensorimotor polyneuropathy or cervical/lumbosacral radiculopathy affecting the left side.   ___________________________ Reyna Cava, DO    Nerve Conduction Studies   Stim Site NR Peak (ms) Norm Peak (ms) O-P Amp (V) Norm O-P Amp  Left Median Anti Sensory (2nd Digit)  32 C  Wrist    *3.8 <3.6 22.0 >15  Left Sup Peroneal Anti Sensory (Ant Lat Mall)  32 C  12 cm    2.2 <4.6 21.9 >4  Left Sural Anti Sensory (Lat Mall)  32 C  Calf    2.6 <4.6  33.1 >4  Left Ulnar Anti Sensory (5th Digit)  32 C  Wrist    2.5 <3.1 13.5 >10     Stim Site NR Onset (ms) Norm Onset (ms) O-P Amp (mV) Norm O-P Amp Site1 Site2 Delta-0 (ms) Dist (cm) Vel (m/s) Norm Vel (m/s)  Left Median Motor (Abd Poll Brev)  32 C  Wrist    3.5 <4.0 8.0 >6 Elbow Wrist 4.6 29.0 63 >50  Elbow    8.1  7.9         Left Peroneal Motor (Ext Dig Brev)  32 C  Ankle    3.2 <6.0 5.1 >2.5 B Fib Ankle 5.9 34.0 58 >40  B Fib    9.1  4.6  Poplt B Fib 1.4 8.0 57 >40  Poplt    10.5  4.6         Left Tibial Motor (Abd Hall Brev)  32 C  Ankle    3.0 <6.0 11.9 >4 Knee Ankle 7.1 41.0 58 >40  Knee    10.1  9.3         Left Ulnar Motor (Abd Dig Minimi)  32 C  Wrist    1.8 <3.1 8.7 >7 B Elbow Wrist 3.3 21.0 64 >50  B Elbow    5.1  7.5  A Elbow B Elbow 1.8 10.0 56 >50  A Elbow    6.9  6.5          Electromyography   Side Muscle Ins.Act Fibs Fasc Recrt Amp Dur Poly Activation Comment  Left 1stDorInt Nml Nml Nml Nml Nml Nml Nml Nml N/A  Left Abd Poll Brev Nml Nml Nml Nml Nml Nml Nml Nml N/A  Left PronatorTeres Nml Nml Nml Nml Nml Nml Nml Nml N/A  Left Biceps Nml Nml Nml Nml Nml Nml Nml Nml N/A  Left Triceps Nml Nml Nml Nml Nml Nml Nml Nml N/A  Left Deltoid Nml Nml Nml *None *- *- *- Nml N/A  Left AntTibialis Nml Nml Nml Nml Nml Nml Nml Nml N/A  Left Gastroc Nml Nml Nml Nml Nml Nml Nml Nml N/A  Left Flex Dig Long Nml Nml Nml Nml Nml Nml Nml Nml N/A  Left RectFemoris Nml Nml Nml Nml Nml Nml Nml Nml N/A  Left BicepsFemS Nml Nml Nml Nml Nml Nml Nml Nml N/A  Left GluteusMed Nml Nml Nml Nml Nml Nml Nml Nml N/A      Waveforms:

## 2024-04-02 ENCOUNTER — Ambulatory Visit: Payer: Self-pay | Admitting: Neurology

## 2024-04-08 ENCOUNTER — Other Ambulatory Visit: Payer: Self-pay | Admitting: Family Medicine

## 2024-04-08 DIAGNOSIS — E669 Obesity, unspecified: Secondary | ICD-10-CM

## 2024-04-08 DIAGNOSIS — E7800 Pure hypercholesterolemia, unspecified: Secondary | ICD-10-CM

## 2024-04-08 DIAGNOSIS — I2510 Atherosclerotic heart disease of native coronary artery without angina pectoris: Secondary | ICD-10-CM

## 2024-04-13 NOTE — Progress Notes (Unsigned)
  Cardiology Office Note:   Date:  04/15/2024  ID:  Alex Wilson, Alex Wilson Nov 09, 1966, MRN [id number] PCP: Eliodoro Guerin, DO  [facility] Providers Cardiologist:  Eilleen Grates, MD {  History of Present Illness:   Alex Wilson is a 58 y.o. male of nonobstructive CAD noted on coronary CTA 12/22, aortic insufficiency, GERD, cardiac murmur, and nonrheumatic mitral valve regurgitation.  He had a normal echocardiogram 7/23.  He had a negative POET (Plain Old Exercise Treadmill).  He has been trying to live a very healthy lifestyle.  He is dropping weight.  He is watching his diet very carefully.  We did do his echocardiogram again in April 2025.  What had previously been called a bicuspid aortic valve probably is tricuspid with some calcification.  The mean gradient is 11.6.  There is some very mild aortic regurgitation.  He has some mention of mild tricuspid regurgitation and trivial mitral valve regurgitation.  He is not having any new symptoms. The patient denies any new symptoms such as chest discomfort, neck or arm discomfort. There has been no new shortness of breath, PND or orthopnea. There have been no reported palpitations, presyncope or syncope.  He stays physically active.  ROS: As stated in the HPI and negative for all other systems.  Studies Reviewed:    EKG:    NA  Risk Assessment/Calculations:       Physical Exam:   VS:  BP 112/72 (BP Location: Left Arm, Patient Position: Sitting, Cuff Size: Normal)   Pulse 72   Ht 5\' 6"  (1.676 m)   Wt 186 lb 12.8 oz (84.7 kg)   SpO2 97%   BMI 30.15 kg/m    Wt Readings from Last 3 Encounters:  04/15/24 186 lb 12.8 oz (84.7 kg)  02/12/24 192 lb (87.1 kg)  02/09/24 191 lb (86.6 kg)     GEN: Well nourished, well developed in no acute distress NECK: No JVD; No carotid bruits CARDIAC: RRR, soft apical systolic murmur radiating slightly at the right outflow tract, no diastolic murmurs, rubs, gallops RESPIRATORY:  Clear to  auscultation without rales, wheezing or rhonchi  ABDOMEN: Soft, non-tender, non-distended EXTREMITIES:  No edema; No deformity   ASSESSMENT AND PLAN:   CAD: The patient has mild nonobstructive coronary disease and will continue with risk reduction.  No change in therapy.   AORTIC CALCIFICATION: This is I will follow this clinically and see him again in about 3 years.   DYSLIPIDEMIA: LDL was 68 and I think this is reasonable.  No change in therapy.     Follow up with me in [place] in 3 years.  Signed, Eilleen Grates, MD

## 2024-04-14 DIAGNOSIS — I359 Nonrheumatic aortic valve disorder, unspecified: Secondary | ICD-10-CM | POA: Insufficient documentation

## 2024-04-15 ENCOUNTER — Ambulatory Visit: Attending: Cardiology | Admitting: Cardiology

## 2024-04-15 ENCOUNTER — Encounter: Payer: Self-pay | Admitting: Cardiology

## 2024-04-15 VITALS — BP 112/72 | HR 72 | Ht 66.0 in | Wt 186.8 lb

## 2024-04-15 DIAGNOSIS — I359 Nonrheumatic aortic valve disorder, unspecified: Secondary | ICD-10-CM | POA: Insufficient documentation

## 2024-04-15 DIAGNOSIS — I2510 Atherosclerotic heart disease of native coronary artery without angina pectoris: Secondary | ICD-10-CM | POA: Diagnosis not present

## 2024-04-15 MED ORDER — ROSUVASTATIN CALCIUM 20 MG PO TABS: 20.0000 mg | ORAL_TABLET | Freq: Every day | ORAL | 3 refills | Status: AC

## 2024-04-15 MED ORDER — DAPAGLIFLOZIN PROPANEDIOL 10 MG PO TABS: 10.0000 mg | ORAL_TABLET | Freq: Every day | ORAL | 3 refills | Status: AC

## 2024-04-15 NOTE — Patient Instructions (Signed)
 Medication Instructions:  Your physician recommends that you continue on your current medications as directed. Please refer to the Current Medication list given to you today.  *If you need a refill on your cardiac medications before your next appointment, please call your pharmacy*  Lab Work: NONE If you have labs (blood work) drawn today and your tests are completely normal, you will receive your results only by: MyChart Message (if you have MyChart) OR A paper copy in the mail If you have any lab test that is abnormal or we need to change your treatment, we will call you to review the results.  Testing/Procedures: NONE  Follow-Up: At [facility], you and your health needs are our priority.  As part of our continuing mission to provide you with exceptional heart care, our providers are all part of one team.  This team includes your primary Cardiologist (physician) and Advanced Practice Providers or APPs (Physician Assistants and Nurse Practitioners) who all work together to provide you with the care you need, when you need it.  Your next appointment:   3 year(s)  Provider:   Lavonne Prairie, MD  We recommend signing up for the patient portal called "MyChart".  Sign up information is provided on this After Visit Summary.  MyChart is used to connect with patients for Virtual Visits (Telemedicine).  Patients are able to view lab/test results, encounter notes, upcoming appointments, etc.  Non-urgent messages can be sent to your provider as well.   To learn more about what you can do with MyChart, go to [email or web address].

## 2024-04-22 DIAGNOSIS — E7800 Pure hypercholesterolemia, unspecified: Secondary | ICD-10-CM | POA: Diagnosis not present

## 2024-04-22 DIAGNOSIS — E1121 Type 2 diabetes mellitus with diabetic nephropathy: Secondary | ICD-10-CM | POA: Diagnosis not present

## 2024-04-22 DIAGNOSIS — N1832 Chronic kidney disease, stage 3b: Secondary | ICD-10-CM | POA: Diagnosis not present

## 2024-04-22 DIAGNOSIS — I10 Essential (primary) hypertension: Secondary | ICD-10-CM | POA: Diagnosis not present

## 2024-04-27 DIAGNOSIS — Z96612 Presence of left artificial shoulder joint: Secondary | ICD-10-CM | POA: Diagnosis not present

## 2024-04-27 DIAGNOSIS — M25511 Pain in right shoulder: Secondary | ICD-10-CM | POA: Diagnosis not present

## 2024-04-28 ENCOUNTER — Encounter: Payer: Self-pay | Admitting: Family Medicine

## 2024-04-28 ENCOUNTER — Ambulatory Visit: Admitting: Family Medicine

## 2024-04-28 VITALS — BP 99/61 | HR 70 | Temp 98.3°F | Ht 66.0 in | Wt 181.6 lb

## 2024-04-28 DIAGNOSIS — Z7689 Persons encountering health services in other specified circumstances: Secondary | ICD-10-CM | POA: Diagnosis not present

## 2024-04-28 DIAGNOSIS — E669 Obesity, unspecified: Secondary | ICD-10-CM | POA: Diagnosis not present

## 2024-04-28 DIAGNOSIS — I2510 Atherosclerotic heart disease of native coronary artery without angina pectoris: Secondary | ICD-10-CM
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
## 2024-04-29 ENCOUNTER — Other Ambulatory Visit (HOSPITAL_COMMUNITY): Payer: Self-pay

## 2024-04-29 ENCOUNTER — Encounter: Payer: Self-pay | Admitting: Pharmacist

## 2024-05-18 DIAGNOSIS — M1712 Unilateral primary osteoarthritis, left knee: Secondary | ICD-10-CM | POA: Diagnosis not present

## 2024-05-19 ENCOUNTER — Telehealth: Payer: Self-pay

## 2024-05-19 NOTE — Telephone Encounter (Signed)
   Pre-operative Risk Assessment    Patient Name: Alex Wilson  DOB: 1966/11/03 MRN: [id number]   Date of last office visit: 04/15/24 LYNWOOD SCHILLING, MD Date of next office visit: NONE   Request for Surgical Clearance    Procedure:  LEFT TOTAL KNEE ARTHROPLASTY  Date of Surgery:  Clearance TBD                                Surgeon:  DR CAMPBELL SHOALS Surgeon's Group or Practice Name:  [facility] Phone number:  [phone] Fax number:  [phone]   ATTN: JOEN WILLS   Type of Clearance Requested:   - Medical    Type of Anesthesia:  Spinal   Additional requests/questions:    SignedLucie DELENA Ku   05/19/2024, 10:51 AM

## 2024-05-19 NOTE — Telephone Encounter (Signed)
   Name: Alex Wilson  DOB: [birth year]  MRN: [id number]   Primary Cardiologist: Lynwood Schilling, MD  Chart reviewed as part of pre-operative protocol coverage. Patient was contacted 05/19/2024 in reference to pre-operative risk assessment for pending surgery as outlined below.  Alex Wilson was last seen on 04/15/2024 by Dr. Schilling.  Since that day, Alex Wilson has done very well and is without cardiac complaints.  Therefore, based on ACC/AHA guidelines, the patient would be at acceptable risk for the planned procedure without further cardiovascular testing.   The patient was advised that if he develops new symptoms prior to surgery to contact our office to arrange for a follow-up visit, and he verbalized understanding.  I will route this recommendation to the requesting party via Epic fax function and remove from pre-op pool. Please call with questions.  Lamarr Satterfield, NP 05/19/2024, 3:36 PM

## 2024-05-22 ENCOUNTER — Other Ambulatory Visit: Payer: Self-pay | Admitting: Family Medicine

## 2024-05-22 DIAGNOSIS — I2510 Atherosclerotic heart disease of native coronary artery without angina pectoris: Secondary | ICD-10-CM

## 2024-05-22 DIAGNOSIS — E7800 Pure hypercholesterolemia, unspecified: Secondary | ICD-10-CM

## 2024-05-22 DIAGNOSIS — E669 Obesity, unspecified: Secondary | ICD-10-CM

## 2024-06-17 ENCOUNTER — Other Ambulatory Visit: Payer: Self-pay | Admitting: Family Medicine

## 2024-06-17 DIAGNOSIS — E7800 Pure hypercholesterolemia, unspecified: Secondary | ICD-10-CM

## 2024-06-17 DIAGNOSIS — E669 Obesity, unspecified: Secondary | ICD-10-CM

## 2024-06-17 DIAGNOSIS — I2510 Atherosclerotic heart disease of native coronary artery without angina pectoris: Secondary | ICD-10-CM

## 2024-06-24 ENCOUNTER — Ambulatory Visit: Admitting: Dermatology

## 2024-06-24 ENCOUNTER — Encounter: Payer: Self-pay | Admitting: Dermatology

## 2024-06-24 VITALS — BP 112/80

## 2024-06-24 DIAGNOSIS — L570 Actinic keratosis: Secondary | ICD-10-CM

## 2024-06-24 DIAGNOSIS — K5900 Constipation, unspecified: Secondary | ICD-10-CM | POA: Diagnosis not present

## 2024-06-24 DIAGNOSIS — R1012 Left upper quadrant pain: Secondary | ICD-10-CM | POA: Diagnosis not present

## 2024-06-24 DIAGNOSIS — R1013 Epigastric pain: Secondary | ICD-10-CM | POA: Diagnosis not present

## 2024-06-24 NOTE — Progress Notes (Signed)
   Follow-Up Visit   Subjective  Alex Wilson is a 58 y.o. male established patient who presents for FOLLOW UP on the diagnoses listed below:  Patient was last evaluated on 01/06/24.   Spot Check: Patient stated the he has a few areas of the face that are scalp feeling and he would like evaluated. He stated during his TBSE on 2/25 a few things were frozen off and he is wondering if they are of any concern.    The following portions of the chart were reviewed this encounter and updated as appropriate: medications, allergies, medical history  Review of Systems:  No other skin or systemic complaints except as noted in HPI or Assessment and Plan.  Objective  Well appearing patient in no apparent distress; mood and affect are within normal limits.   A focused examination was performed of the following areas: face   Relevant exam findings are noted in the Assessment and Plan.  L temple, L cheek, R cheek, (6) Erythematous thin papules/macules with gritty scale.   Assessment & Plan   AK (ACTINIC KERATOSIS) (6) L temple, L cheek, R cheek, (6) Destruction of lesion - L temple, L cheek, R cheek, (6) Complexity: simple   Destruction method: cryotherapy   Informed consent: discussed and consent obtained   Timeout:  patient name, date of birth, surgical site, and procedure verified Lesion destroyed using liquid nitrogen: Yes   Region frozen until ice ball extended beyond lesion: Yes   Outcome: patient tolerated procedure well with no complications   Post-procedure details: wound care instructions given     No follow-ups on file.   Documentation: I have reviewed the above documentation for accuracy and completeness, and I agree with the above.  I, Shirron Maranda, CMA, am acting as scribe for [employer], DO.   Delon Lenis, DO

## 2024-06-24 NOTE — Patient Instructions (Addendum)

## 2024-06-29 ENCOUNTER — Encounter: Payer: Self-pay | Admitting: Family Medicine

## 2024-06-29 ENCOUNTER — Ambulatory Visit: Admitting: Family Medicine

## 2024-06-29 VITALS — BP 110/73 | HR 78 | Temp 97.5°F | Ht 66.0 in | Wt 176.1 lb

## 2024-06-29 DIAGNOSIS — E7800 Pure hypercholesterolemia, unspecified: Secondary | ICD-10-CM | POA: Diagnosis not present

## 2024-06-29 DIAGNOSIS — I2510 Atherosclerotic heart disease of native coronary artery without angina pectoris: Secondary | ICD-10-CM

## 2024-06-29 DIAGNOSIS — R5383 Other fatigue: Secondary | ICD-10-CM | POA: Diagnosis not present

## 2024-06-29 DIAGNOSIS — Z01818 Encounter for other preprocedural examination: Secondary | ICD-10-CM | POA: Diagnosis not present

## 2024-06-29 DIAGNOSIS — M1712 Unilateral primary osteoarthritis, left knee: Secondary | ICD-10-CM

## 2024-06-29 NOTE — Progress Notes (Signed)
 Pt is a 59 y.o. male who is here for preoperative clearance for total replacement of the left knee.  He will be getting this done with Dr. Fidel at [facility].  He has been cleared by his cardiologist. He denies chest pain, shortness of breath, difficulty swallowing. Has some issues with energy though and would like testosterone  checked.  1) High Risk Cardiac Conditions  1) Recent MI - No.  2) Decompensated Heart Failure - No.  3) Unstable angina - No.  4) Symptomatic arrythmia - No.  5) Sx Valvular Disease - No.  2) Intermediate Risk Factors -  CAD - Yes.    2) Functional Status - > 4 mets (Walk, run, climb stairs) Yes.  Alex Wilson Activity Status Index: 50.7  3) Surgery Specific Risk -   Intermediate (Orthopaedic)      4) Further Noninvasive evaluation -   1) EKG - No. Done 02/2024   1) Hx of CAD   2) Echo - No.   1) Worsening dyspnea   3) Stress Testing - Active Cardiac Disease - No.  5) Need for medical therapy - Beta Blocker, Statins indicated ? No.  PE: Vitals:   06/29/24 1125  BP: 110/73  Pulse: 78  Temp: (!) 97.5 F (36.4 C)  SpO2: 95%   Physical Examination: General appearance - alert, well appearing, and in no distress Mental status - alert, oriented to person, place, and time Eyes - pupils equal and reactive, extraocular eye movements intact Mouth - mucous membranes moist, pharynx normal without lesions and Mallampati 1 Neck - has full extension of cspine. No masses Chest - clear to auscultation, no wheezes, rales or rhonchi, symmetric air entry Heart - normal rate, regular rhythm, normal S1, S2, no murmurs, rubs, clicks or gallops  Preop examination - Plan: CANCELED: CMP14+EGFR, CANCELED: CoaguChek XS/INR Waived, CANCELED: CBC  Primary osteoarthritis of left knee  Coronary artery disease involving native coronary artery of native heart without angina pectoris - Plan: Testosterone , CANCELED: CMP14+EGFR, CANCELED: CoaguChek XS/INR Waived, CANCELED: CBC  Pure  hypercholesterolemia - Plan: CANCELED: CMP14+EGFR, CANCELED: CoaguChek XS/INR Waived, CANCELED: CBC  Low energy - Plan: Testosterone   I have independently evaluated patient.  Alex Wilson is a 58 y.o. male who is low/moderate risk for a intermediate risk surgery.  There are not modifiable risk factors (smoking, etc). Alex Wilson's RCRI calculation for MACE is: 1.    Testosterone  ordered.  Labs for preop per [facility] per clearance form.   Alex Wilson M. Jolinda, DO [facility]

## 2024-06-30 ENCOUNTER — Other Ambulatory Visit

## 2024-06-30 DIAGNOSIS — I2510 Atherosclerotic heart disease of native coronary artery without angina pectoris: Secondary | ICD-10-CM | POA: Diagnosis not present

## 2024-06-30 DIAGNOSIS — R5383 Other fatigue: Secondary | ICD-10-CM | POA: Diagnosis not present

## 2024-06-30 DIAGNOSIS — K219 Gastro-esophageal reflux disease without esophagitis: Secondary | ICD-10-CM | POA: Diagnosis not present

## 2024-07-01 ENCOUNTER — Ambulatory Visit: Payer: Self-pay | Admitting: Family Medicine

## 2024-07-01 LAB — TESTOSTERONE: Testosterone: 671 ng/dL (ref 264–916)

## 2024-07-09 DIAGNOSIS — R1013 Epigastric pain: Secondary | ICD-10-CM | POA: Diagnosis not present

## 2024-07-09 DIAGNOSIS — R6881 Early satiety: Secondary | ICD-10-CM | POA: Diagnosis not present

## 2024-07-09 DIAGNOSIS — R1012 Left upper quadrant pain: Secondary | ICD-10-CM | POA: Diagnosis not present

## 2024-07-20 DIAGNOSIS — R110 Nausea: Secondary | ICD-10-CM | POA: Diagnosis not present

## 2024-07-20 DIAGNOSIS — I7789 Other specified disorders of arteries and arterioles: Secondary | ICD-10-CM | POA: Diagnosis not present

## 2024-07-20 DIAGNOSIS — R1012 Left upper quadrant pain: Secondary | ICD-10-CM | POA: Diagnosis not present

## 2024-08-06 ENCOUNTER — Telehealth: Payer: Self-pay | Admitting: Family Medicine

## 2024-08-06 NOTE — Telephone Encounter (Signed)
 Pt came in with letter from [employer] stating that his wegovy  (4) inj 2.4 MG will not be covered starting 08/11/24. He wants to know what he can change to.

## 2024-08-06 NOTE — Telephone Encounter (Signed)
 He has CAD so this change should not affect him. It's only for those using it strictly for obesity

## 2024-08-09 ENCOUNTER — Other Ambulatory Visit (HOSPITAL_COMMUNITY): Payer: Self-pay

## 2024-08-09 NOTE — Telephone Encounter (Signed)
 Left detailed message making patient aware of PCPs advise and advised patient to call us  back if he had additional questions.

## 2024-08-10 NOTE — Telephone Encounter (Signed)
 Patient came to the office and I discussed this with him. Patient voiced understanding.

## 2024-09-01 ENCOUNTER — Other Ambulatory Visit (HOSPITAL_COMMUNITY): Payer: Self-pay

## 2024-09-08 ENCOUNTER — Encounter: Payer: Self-pay | Admitting: Family Medicine

## 2024-09-08 ENCOUNTER — Ambulatory Visit: Admitting: Family Medicine

## 2024-09-08 VITALS — BP 101/64 | HR 78 | Temp 97.6°F | Ht 66.0 in | Wt 172.5 lb

## 2024-09-08 DIAGNOSIS — Z7689 Persons encountering health services in other specified circumstances: Secondary | ICD-10-CM | POA: Diagnosis not present

## 2024-09-08 DIAGNOSIS — I2510 Atherosclerotic heart disease of native coronary artery without angina pectoris: Secondary | ICD-10-CM | POA: Diagnosis not present

## 2024-09-08 DIAGNOSIS — Z6827 Body mass index (BMI) 27.0-27.9, adult: Secondary | ICD-10-CM
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
## 2024-09-27 ENCOUNTER — Ambulatory Visit: Admitting: Urology

## 2024-10-12 ENCOUNTER — Telehealth: Payer: Self-pay

## 2024-10-12 NOTE — Telephone Encounter (Signed)
 Patient walked into office to let Dr. Jolinda know that he has talked to [facility] at the pharmacy about his prior authorization for his Wegovy . Patient would like a MyChart message sent to him when the prior authorization has been taken care of.

## 2024-10-13 ENCOUNTER — Telehealth: Payer: Self-pay | Admitting: Pharmacy Technician

## 2024-10-13 ENCOUNTER — Other Ambulatory Visit (HOSPITAL_COMMUNITY): Payer: Self-pay

## 2024-10-13 NOTE — Telephone Encounter (Signed)
Noted  -LS

## 2024-10-13 NOTE — Telephone Encounter (Signed)
 Pharmacy Patient Advocate Encounter  Received notification from [employer] that Prior Authorization for Wegovy  2.4MG /0.75ML auto-injectors has been APPROVED from 10/13/24 to 04/11/25. Ran test claim, Copay is $4.00. This test claim was processed through [facility]- copay amounts may vary at other pharmacies due to pharmacy/plan contracts, or as the patient moves through the different stages of their insurance plan.   PA #/Case ID/Reference #: [id number]

## 2024-10-13 NOTE — Telephone Encounter (Signed)
 Pharmacy Patient Advocate Encounter   Received notification from Onbase that prior authorization for Wegovy  2.4MG /0.75ML auto-injectors is required/requested.   Insurance verification completed.   The patient is insured through [employer].   Per test claim: PA required; PA started via CoverMyMeds. KEY [id number] . Waiting for clinical questions to populate.

## 2024-11-15 ENCOUNTER — Ambulatory Visit: Admitting: Urology

## 2024-11-15 ENCOUNTER — Encounter: Payer: Self-pay | Admitting: Urology

## 2024-11-15 VITALS — BP 96/60 | HR 83

## 2024-11-15 DIAGNOSIS — N401 Enlarged prostate with lower urinary tract symptoms: Secondary | ICD-10-CM | POA: Diagnosis not present

## 2024-11-15 DIAGNOSIS — R3915 Urgency of urination: Secondary | ICD-10-CM

## 2024-11-15 DIAGNOSIS — N529 Male erectile dysfunction, unspecified: Secondary | ICD-10-CM | POA: Diagnosis not present

## 2024-11-15 DIAGNOSIS — N138 Other obstructive and reflux uropathy: Secondary | ICD-10-CM | POA: Diagnosis not present

## 2024-11-15 DIAGNOSIS — N5203 Combined arterial insufficiency and corporo-venous occlusive erectile dysfunction: Secondary | ICD-10-CM
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
## 2024-11-16 LAB — URINALYSIS, ROUTINE W REFLEX MICROSCOPIC
Bilirubin, UA: NEGATIVE
Leukocytes,UA: NEGATIVE
Nitrite, UA: NEGATIVE
Protein,UA: NEGATIVE
RBC, UA: NEGATIVE
Specific Gravity, UA: <1.005 — ABNORMAL LOW (ref 1.005–1.030)
Urobilinogen, Ur: 0.2 mg/dL (ref 0.2–1.0)
pH, UA: 7 (ref 5.0–7.5)

## 2025-01-06 ENCOUNTER — Ambulatory Visit: Admitting: Dermatology

## 2025-02-09 ENCOUNTER — Encounter: Admitting: Family Medicine

## 2025-02-28 ENCOUNTER — Ambulatory Visit: Admitting: Urology

## 2025-05-30 ENCOUNTER — Ambulatory Visit: Admitting: Urology
